# Patient Record
Sex: Male | Born: 1935 | Race: White | Hispanic: No | Marital: Married | Smoking: Never smoker
Health system: Southern US, Community
[De-identification: ages and names within clinical notes are randomized; demographics above are authoritative.]

## PROBLEM LIST (undated history)

## (undated) DIAGNOSIS — I2510 Atherosclerotic heart disease of native coronary artery without angina pectoris: Secondary | ICD-10-CM

---

## 2011-02-15 ENCOUNTER — Ambulatory Visit: Payer: Self-pay | Admitting: Rehabilitation

## 2011-02-26 ENCOUNTER — Ambulatory Visit: Payer: Medicare Other | Attending: Family Medicine | Admitting: Physical Therapy

## 2011-02-26 DIAGNOSIS — R269 Unspecified abnormalities of gait and mobility: Secondary | ICD-10-CM | POA: Insufficient documentation

## 2011-02-26 DIAGNOSIS — IMO0001 Reserved for inherently not codable concepts without codable children: Secondary | ICD-10-CM | POA: Insufficient documentation

## 2014-02-23 DIAGNOSIS — H9190 Unspecified hearing loss, unspecified ear: Secondary | ICD-10-CM

## 2014-02-23 DIAGNOSIS — K5090 Crohn's disease, unspecified, without complications: Secondary | ICD-10-CM | POA: Insufficient documentation

## 2014-04-05 DIAGNOSIS — N400 Enlarged prostate without lower urinary tract symptoms: Secondary | ICD-10-CM | POA: Insufficient documentation

## 2014-07-06 DIAGNOSIS — N1831 Chronic kidney disease, stage 3a: Secondary | ICD-10-CM | POA: Diagnosis present

## 2015-07-08 DIAGNOSIS — I10 Essential (primary) hypertension: Secondary | ICD-10-CM | POA: Diagnosis present

## 2020-02-15 DIAGNOSIS — I5040 Unspecified combined systolic (congestive) and diastolic (congestive) heart failure: Secondary | ICD-10-CM | POA: Diagnosis present

## 2021-02-28 ENCOUNTER — Encounter (HOSPITAL_BASED_OUTPATIENT_CLINIC_OR_DEPARTMENT_OTHER): Payer: Self-pay | Admitting: *Deleted

## 2021-02-28 ENCOUNTER — Emergency Department (HOSPITAL_BASED_OUTPATIENT_CLINIC_OR_DEPARTMENT_OTHER)
Admission: EM | Admit: 2021-02-28 | Discharge: 2021-02-28 | Disposition: A | Discharge status: Home / Self Care | Payer: Medicare HMO | Attending: Emergency Medicine | Admitting: Emergency Medicine

## 2021-02-28 ENCOUNTER — Emergency Department (HOSPITAL_BASED_OUTPATIENT_CLINIC_OR_DEPARTMENT_OTHER): Payer: Medicare HMO

## 2021-02-28 ENCOUNTER — Other Ambulatory Visit: Payer: Self-pay

## 2021-02-28 DIAGNOSIS — I129 Hypertensive chronic kidney disease with stage 1 through stage 4 chronic kidney disease, or unspecified chronic kidney disease: Secondary | ICD-10-CM | POA: Insufficient documentation

## 2021-02-28 DIAGNOSIS — I4819 Other persistent atrial fibrillation: Secondary | ICD-10-CM | POA: Insufficient documentation

## 2021-02-28 DIAGNOSIS — S51012A Laceration without foreign body of left elbow, initial encounter: Secondary | ICD-10-CM

## 2021-02-28 DIAGNOSIS — N183 Chronic kidney disease, stage 3 unspecified: Secondary | ICD-10-CM | POA: Diagnosis not present

## 2021-02-28 DIAGNOSIS — Z79899 Other long term (current) drug therapy: Secondary | ICD-10-CM | POA: Diagnosis not present

## 2021-02-28 DIAGNOSIS — Z96641 Presence of right artificial hip joint: Secondary | ICD-10-CM | POA: Insufficient documentation

## 2021-02-28 DIAGNOSIS — Y9289 Other specified places as the place of occurrence of the external cause: Secondary | ICD-10-CM | POA: Insufficient documentation

## 2021-02-28 DIAGNOSIS — I2510 Atherosclerotic heart disease of native coronary artery without angina pectoris: Secondary | ICD-10-CM | POA: Insufficient documentation

## 2021-02-28 DIAGNOSIS — S5002XA Contusion of left elbow, initial encounter: Secondary | ICD-10-CM

## 2021-02-28 DIAGNOSIS — W010XXA Fall on same level from slipping, tripping and stumbling without subsequent striking against object, initial encounter: Secondary | ICD-10-CM | POA: Diagnosis not present

## 2021-02-28 DIAGNOSIS — Z7901 Long term (current) use of anticoagulants: Secondary | ICD-10-CM | POA: Insufficient documentation

## 2021-02-28 DIAGNOSIS — S59902A Unspecified injury of left elbow, initial encounter: Secondary | ICD-10-CM | POA: Diagnosis present

## 2021-02-28 DIAGNOSIS — W19XXXA Unspecified fall, initial encounter: Secondary | ICD-10-CM

## 2021-02-28 HISTORY — DX: Atherosclerotic heart disease of native coronary artery without angina pectoris: I25.10

## 2021-02-28 NOTE — ED Notes (Signed)
Pt ambulatory to waiting room. Pt verbalized understanding of discharge instructions.   

## 2021-02-28 NOTE — ED Provider Notes (Signed)
[facility] EMERGENCY DEPARTMENT Provider Note   CSN: [id number] Arrival date & time: 02/28/21  1406     History Chief Complaint  Patient presents with   Fall   Elbow Injury    Hector Vazquez is a 87 y.o. male.  Pt presents to the ED today with left elbow pain.  Pt lost his balance and fell and hit his left elbow.  He is on coumadin for a hx of afib.  Pt said he was bringing food out to the deer.  He lost his wife of 70 years and said she normally would have fixed him up, but she is no longer there.  He has her picture in his pocket and said he really misses her.  He denies any other injury.  He did not hit his head.      Past Medical History:  Diagnosis Date   Coronary artery disease     There are no problems to display for this patient.   History reviewed. No pertinent surgical history.    Medical history Past Medical History:  Diagnosis Date   BPH (benign prostatic hyperplasia) 04/05/2014   CKD (chronic kidney disease), stage III (HCC)   Crohn's disease (HCC) 02/23/2014   Cutaneous ulcer, limited to breakdown of skin (HCC) 07/25/2017  right plantar surface of foot   Elevated MCV 03/31/2018   Essential hypertension   GERD (gastroesophageal reflux disease) 07/08/2015   Hearing loss   HOH (hard of hearing) 08/07/2018   Hyperlipidemia   Organic impotence   Osteoarthritis   Peripheral edema  wears compression stockings   Peripheral neuropathy   Persistent atrial fibrillation (HCC) 2015  and anticoagulated on Coumadin managed by [facility]; Dr. Sampson Goon   Renal cyst   Rosacea   Stage 3 chronic kidney disease (HCC) 07/06/2014   Vertigo   Surgical History Past Surgical History:  Procedure Laterality Date   CATARACT EXTRACTION W/ INTRAOCULAR LENS IMPLANT Right 08/19/2018  Procedure: PHACOEMULSIFICATION PC / IOL TOPICAL; Surgeon: Holli Humbles, MD; Location: [facility] MAIN OR; Service: Ophthalmology; Laterality: Right;   CATARACT EXTRACTION W/  INTRAOCULAR LENS IMPLANT Left 09/04/2018  Procedure: PHACOEMULSIFICATION PC / IOL TOPICAL; Surgeon: Holli Humbles, MD; Location: [facility] MAIN OR; Service: Ophthalmology; Laterality: Left;   EP CARDIOVERSION 2015   HERNIA REPAIR Bilateral remote   HIP ARTHROPLASTY Right 2004  due to OA    No family history on file.  Social History   Tobacco Use   Smoking status: Never   Smokeless tobacco: Never  Vaping Use   Vaping Use: Never used  Substance Use Topics   Alcohol use: Never   Drug use: Never    Home Medications Prior to Admission medications   Medication Sig Start Date End Date Taking? Authorizing Provider  b complex vitamins capsule Take 1 capsule by mouth daily. 09/03/18  Yes [provider]  Cholecalciferol 25 MCG (1000 UT) capsule Take by mouth. 11/17/13  Yes [provider]  diltiazem (TIAZAC) 240 MG 24 hr capsule Take 1 capsule by mouth daily. 12/12/20 12/12/21 Yes [provider]  lovastatin (MEVACOR) 20 MG tablet Take by mouth. 12/12/20  Yes [provider]  metoprolol succinate (TOPROL-XL) 25 MG 24 hr tablet Take 1 tablet by mouth daily. 02/17/21  Yes [provider]  warfarin (COUMADIN) 2.5 MG tablet Take 1 full tablet daily or as directed 02/03/21  Yes [provider]    Allergies    Patient has no known allergies.  Review of Systems  Review of Systems  Musculoskeletal:        Left elbow pain  All other systems reviewed and are negative.  Physical Exam Updated Vital Signs BP 131/80 (BP Location: Right Arm)   Pulse 98   Temp 99.5 F (37.5 C) (Oral)   Resp 20   Ht 6\' 3"  (1.905 m)   Wt 90.7 kg   SpO2 93%   BMI 25.00 kg/m   Physical Exam Vitals and nursing note reviewed.  Constitutional:      Appearance: Normal appearance.  HENT:     Head: Normocephalic and atraumatic.     Right Ear: External ear normal.     Left Ear: External ear normal.     Nose: Nose normal.     Mouth/Throat:     Mouth: Mucous  membranes are moist.     Pharynx: Oropharynx is clear.  Eyes:     Extraocular Movements: Extraocular movements intact.     Conjunctiva/sclera: Conjunctivae normal.     Pupils: Pupils are equal, round, and reactive to light.  Cardiovascular:     Rate and Rhythm: Normal rate. Rhythm irregular.     Pulses: Normal pulses.     Heart sounds: Normal heart sounds.  Pulmonary:     Effort: Pulmonary effort is normal.     Breath sounds: Normal breath sounds.  Abdominal:     General: Abdomen is flat. Bowel sounds are normal.     Palpations: Abdomen is soft.  Musculoskeletal:     Cervical back: Normal range of motion and neck supple.     Comments: Good rom to left elbow.  Skin tear to left elbow.  Skin:    Capillary Refill: Capillary refill takes less than 2 seconds.  Neurological:     General: No focal deficit present.     Mental Status: He is alert and oriented to person, place, and time.  Psychiatric:        Mood and Affect: Mood normal.    ED Results / Procedures / Treatments   Labs (all labs ordered are listed, but only abnormal results are displayed) Labs Reviewed - No data to display  EKG None  Radiology DG Elbow Complete Left  Result Date: 02/28/2021 CLINICAL DATA:  87 year old male with fall EXAM: LEFT ELBOW - COMPLETE 3+ VIEW COMPARISON:  None. FINDINGS: There is no evidence of fracture, dislocation, or joint effusion. Triceps tendon enthesophyte. No focal bone abnormality. Soft tissues are unremarkable. IMPRESSION: No acute fracture or dislocation. Electronically Signed   By: 92 MD   On: 02/28/2021 15:14    Procedures .10/09/2022Laceration Repair  Date/Time: 02/28/2021 4:42 PM Performed by: 04/30/2021, MD Authorized by: Jacalyn Lefevre, MD   Consent:    Consent obtained:  Verbal   Consent given by:  Patient Universal protocol:    Patient identity confirmed:  Verbally with patient Anesthesia:    Anesthesia method:  None Laceration details:    Location:   Shoulder/arm   Shoulder/arm location:  L elbow   Length (cm):  1.5 Exploration:    Contaminated: no   Treatment:    Area cleansed with:  Saline Skin repair:    Repair method:  Steri-Strips   Number of Steri-Strips:  3 Repair type:    Repair type:  Simple Post-procedure details:    Dressing:  Sterile dressing   Procedure completion:  Tolerated well, no immediate complications   Medications Ordered in ED Medications - No data to display  ED Course  I have reviewed the  triage vital signs and the nursing notes.  Pertinent labs & imaging results that were available during my care of the patient were reviewed by me and considered in my medical decision making (see chart for details).    MDM Rules/Calculators/A&P                           Xray neg.  Pt is stable for d/c.  Return if worse.  F/u with pcp. Final Clinical Impression(s) / ED Diagnoses Final diagnoses:  Fall, initial encounter  Skin tear of left elbow without complication, initial encounter  Contusion of left elbow, initial encounter    Rx / DC Orders ED Discharge Orders     None        Jacalyn Lefevre, MD 02/28/21 1643

## 2021-02-28 NOTE — ED Triage Notes (Signed)
He lost his balance and fell this am. Abrasion and pain to his left elbow.

## 2021-12-18 ENCOUNTER — Other Ambulatory Visit: Payer: Self-pay

## 2021-12-18 ENCOUNTER — Encounter (HOSPITAL_BASED_OUTPATIENT_CLINIC_OR_DEPARTMENT_OTHER): Payer: Self-pay | Admitting: Emergency Medicine

## 2021-12-18 ENCOUNTER — Emergency Department (HOSPITAL_BASED_OUTPATIENT_CLINIC_OR_DEPARTMENT_OTHER)
Admission: EM | Admit: 2021-12-18 | Discharge: 2021-12-18 | Disposition: A | Discharge status: Home / Self Care | Payer: Medicare HMO | Attending: Emergency Medicine | Admitting: Emergency Medicine

## 2021-12-18 ENCOUNTER — Emergency Department (HOSPITAL_BASED_OUTPATIENT_CLINIC_OR_DEPARTMENT_OTHER): Payer: Medicare HMO

## 2021-12-18 DIAGNOSIS — M25561 Pain in right knee: Secondary | ICD-10-CM | POA: Insufficient documentation

## 2021-12-18 DIAGNOSIS — S8001XA Contusion of right knee, initial encounter: Secondary | ICD-10-CM | POA: Insufficient documentation

## 2021-12-18 DIAGNOSIS — W1830XA Fall on same level, unspecified, initial encounter: Secondary | ICD-10-CM | POA: Insufficient documentation

## 2021-12-18 DIAGNOSIS — Z7901 Long term (current) use of anticoagulants: Secondary | ICD-10-CM | POA: Diagnosis not present

## 2021-12-18 DIAGNOSIS — S8991XA Unspecified injury of right lower leg, initial encounter: Secondary | ICD-10-CM | POA: Diagnosis present

## 2021-12-18 MED ORDER — DICLOFENAC SODIUM 1 % EX GEL: 4.0000 g | Freq: Four times a day (QID) | CUTANEOUS | 0 refills | Status: DC

## 2021-12-18 NOTE — ED Triage Notes (Signed)
Pt reports falling on concrete on the 24th, hitting his right knee. He has been having increased pain and bruising to the area. Denies numbness in foot, ambulatory with cane, bilateral edema; large area of bruising around the knee.

## 2021-12-18 NOTE — ED Provider Notes (Signed)
[place] EMERGENCY DEPARTMENT Provider Note   CSN: [id number] Arrival date & time: 12/18/21  1049     History  Chief Complaint  Patient presents with   Knee Pain    Hector Vazquez is a 88 y.o. male.  88 yo M with a chief complaints of a fall.  This was not syncopal by history and occurred about 5 days ago.  He fell onto the concrete onto a flexed knee.  Has had some pain and swelling since then.  He has been able to ambulate but with some discomfort.  He developed a bit of bruising there and was concerned and so came to the emergency department for evaluation.       Home Medications Prior to Admission medications   Medication Sig Start Date End Date Taking? Authorizing Provider  b complex vitamins capsule Take 1 capsule by mouth daily. 09/03/18   [provider]  Cholecalciferol 25 MCG (1000 UT) capsule Take by mouth. 11/17/13   [provider]  diclofenac Sodium (VOLTAREN) 1 % GEL Apply 4 g topically 4 (four) times daily. 12/18/21   Deno Etienne, DO  diltiazem (TIAZAC) 240 MG 24 hr capsule Take 1 capsule by mouth daily. 12/12/20 12/12/21  [provider]  lovastatin (MEVACOR) 20 MG tablet Take by mouth. 12/12/20   [provider]  metoprolol succinate (TOPROL-XL) 25 MG 24 hr tablet Take 1 tablet by mouth daily. 02/17/21   [provider]  warfarin (COUMADIN) 2.5 MG tablet Take 1 full tablet daily or as directed 02/03/21   [provider]      Allergies    Patient has no known allergies.    Review of Systems   Review of Systems  Physical Exam Updated Vital Signs BP 124/70 (BP Location: Right Arm)   Pulse (!) 57   Temp 97.9 F (36.6 C) (Oral)   Resp 20   Ht 6\' 3"  (1.905 m)   Wt 97.5 kg   SpO2 97%   BMI 26.87 kg/m  Physical Exam Vitals and nursing note reviewed.  Constitutional:      Appearance: He is well-developed.  HENT:     Head: Normocephalic and atraumatic.  Eyes:     Pupils: Pupils are equal, round,  and reactive to light.  Neck:     Vascular: No JVD.  Cardiovascular:     Rate and Rhythm: Normal rate and regular rhythm.     Heart sounds: No murmur heard.   No friction rub. No gallop.  Pulmonary:     Effort: No respiratory distress.     Breath sounds: No wheezing.  Abdominal:     General: There is no distension.     Tenderness: There is no abdominal tenderness. There is no guarding or rebound.  Musculoskeletal:        General: Swelling and tenderness present. Normal range of motion.     Cervical back: Normal range of motion and neck supple.     Comments: Pain and swelling to the right lower extremity.  Chronic appearing edema to bilateral lower extremities slightly worse on the right than the left.  Pulse motor and sensation intact distally.  Large hematoma just distal to the knee along the medial aspect of the leg on the right.  No obvious bony tenderness.  Able to range the knee without significant pain.  Skin:    Coloration: Skin is not pale.     Findings: No rash.  Neurological:     Mental Status: He  is alert and oriented to person, place, and time.  Psychiatric:        Behavior: Behavior normal.    ED Results / Procedures / Treatments   Labs (all labs ordered are listed, but only abnormal results are displayed) Labs Reviewed - No data to display  EKG None  Radiology DG Knee Complete 4 Views Right  Result Date: 12/18/2021 CLINICAL DATA:  Knee pain after fall. EXAM: RIGHT KNEE - COMPLETE 4+ VIEW COMPARISON:  None Available. FINDINGS: Enthesopathic changes associated with the patella. 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 No acute fracture identified. No joint effusion. No significant degenerative changes. No other acute abnormalities. IMPRESSION: No acute abnormalities are identified. Electronically Signed   By: Dorise Bullion III M.D.   On: 12/18/2021 11:49    Procedures Procedures    Medications Ordered in ED Medications - No data to display  ED Course/  Medical Decision Making/ A&P                           Medical Decision Making Amount and/or Complexity of Data Reviewed Radiology: ordered.   88 yo M with a chief complaint of right knee pain.  This started about 5 days ago after a nonsyncopal fall.  Clinically the patient is suffering from a fairly large hematoma.  No signs of compartment syndrome.  We will obtain a plain film of the knee to assess for fracture.  Plain film independently interpreted by me without fracture.  Knee sleeves for support.  PCP follow-up.  11:55 AM:  I have discussed the diagnosis/risks/treatment options with the patient.  Evaluation and diagnostic testing in the emergency department does not suggest an emergent condition requiring admission or immediate intervention beyond what has been performed at this time.  They will follow up with  PCP. We also discussed returning to the ED immediately if new or worsening sx occur. We discussed the sx which are most concerning (e.g., sudden worsening pain, fever, inability to tolerate by mouth) that necessitate immediate return. Medications administered to the patient during their visit and any new prescriptions provided to the patient are listed below.  Medications given during this visit Medications - No data to display   The patient appears reasonably screen and/or stabilized for discharge and I doubt any other medical condition or other [facility] requiring further screening, evaluation, or treatment in the ED at this time prior to discharge.          Final Clinical Impression(s) / ED Diagnoses Final diagnoses:  Acute pain of right knee  Hematoma of right knee region    Rx / DC Orders ED Discharge Orders          Ordered    diclofenac Sodium (VOLTAREN) 1 % GEL  4 times daily,   Status:  Discontinued        12/18/21 1153    diclofenac Sodium (VOLTAREN) 1 % GEL  4 times daily        12/18/21 Bieber, Saliha Salts, DO 12/18/21 1155

## 2021-12-18 NOTE — Discharge Instructions (Signed)
Use the gel as prescribed.  Take tylenol 1000mg(2 extra strength) four times a day.      

## 2021-12-18 NOTE — ED Notes (Signed)
Pt to XRAY

## 2021-12-19 ENCOUNTER — Encounter (HOSPITAL_BASED_OUTPATIENT_CLINIC_OR_DEPARTMENT_OTHER): Payer: Self-pay

## 2021-12-19 ENCOUNTER — Emergency Department (HOSPITAL_BASED_OUTPATIENT_CLINIC_OR_DEPARTMENT_OTHER)
Admission: EM | Admit: 2021-12-19 | Discharge: 2021-12-19 | Disposition: A | Discharge status: Home / Self Care | Payer: Medicare HMO | Attending: Emergency Medicine | Admitting: Emergency Medicine

## 2021-12-19 ENCOUNTER — Other Ambulatory Visit: Payer: Self-pay

## 2021-12-19 DIAGNOSIS — R600 Localized edema: Secondary | ICD-10-CM | POA: Insufficient documentation

## 2021-12-19 DIAGNOSIS — M25561 Pain in right knee: Secondary | ICD-10-CM | POA: Diagnosis present

## 2021-12-19 DIAGNOSIS — I2510 Atherosclerotic heart disease of native coronary artery without angina pectoris: Secondary | ICD-10-CM | POA: Insufficient documentation

## 2021-12-19 DIAGNOSIS — Z79899 Other long term (current) drug therapy: Secondary | ICD-10-CM | POA: Insufficient documentation

## 2021-12-19 DIAGNOSIS — R2243 Localized swelling, mass and lump, lower limb, bilateral: Secondary | ICD-10-CM | POA: Diagnosis not present

## 2021-12-19 DIAGNOSIS — Z7901 Long term (current) use of anticoagulants: Secondary | ICD-10-CM | POA: Diagnosis not present

## 2021-12-19 DIAGNOSIS — I4891 Unspecified atrial fibrillation: Secondary | ICD-10-CM | POA: Diagnosis not present

## 2021-12-19 DIAGNOSIS — Z87898 Personal history of other specified conditions: Secondary | ICD-10-CM

## 2021-12-19 NOTE — ED Triage Notes (Signed)
Arrived to triage via W/C, brought in by family. Seen yesterday for right knee pain. Family reports knee brace is to tight and unable to remove due to swelling

## 2021-12-19 NOTE — Discharge Instructions (Signed)
Your knee brace was removed today.  Believe the worsening swelling was secondary to the knee brace.  We do not need to put another knee brace on.  I do recommend that she elevate your legs when you are lying down, and wear compression stockings.  You state that you normally have some degree of swelling in both of your lower legs however the swelling worsened after you have the knee brace put on yesterday.  We did discuss doing an ultrasound of your leg however given that you are already taken a blood thinner this would not change management even if there was a blood clot.  You are in agreement to not have this done today.  If you do develop chest pain, shortness of breath we would want to see in the emergency room at that time to evaluate for a blood clot in the lungs.  Keep your appointment with your primary care doctor tomorrow morning.  If you have any worsening symptoms return to the emergency room.

## 2021-12-19 NOTE — ED Provider Notes (Signed)
[facility] EMERGENCY DEPARTMENT Provider Note   CSN: [id number] Arrival date & time: 12/19/21  1744     History  Chief Complaint  Patient presents with   Knee Pain    Hector Vazquez is a 88 y.o. male.  88 year old male with past medical history of peripheral edema was recently seen yesterday for evaluation of right knee pain.  Patient had a fall that occurred on 5/24.  He was struck by a shopping cart which caused him to lose his balance and fall onto pavement.  He does take Coumadin for A-fib.  Recent INR today was 3.0.  Has a large hematoma in the right knee.  Bruising noted.  Reports today patient's swelling in the right lower extremity worsened and he was unable to get the knee brace off which was placed yesterday.  Denies other complaints.  Patient is still able to ambulate using his cane which he has been using for over a year.  He presents today with his neighbor.  Patient does live alone.  He has an appointment with his PCP tomorrow morning at 9 AM.  Denies chest pain, shortness of breath.  Denies any worsening in range of motion in his right lower extremity or any other new complaints other than worsening swelling since the knee brace was put on.  The history is provided by the patient. No language interpreter was used.      Home Medications Prior to Admission medications   Medication Sig Start Date End Date Taking? Authorizing Provider  b complex vitamins capsule Take 1 capsule by mouth daily. 09/03/18   [provider]  Cholecalciferol 25 MCG (1000 UT) capsule Take by mouth. 11/17/13   [provider]  diclofenac Sodium (VOLTAREN) 1 % GEL Apply 4 g topically 4 (four) times daily. 12/18/21   Melene Plan, DO  diltiazem (TIAZAC) 240 MG 24 hr capsule Take 1 capsule by mouth daily. 12/12/20 12/12/21  [provider]  lovastatin (MEVACOR) 20 MG tablet Take by mouth. 12/12/20   [provider]  metoprolol succinate (TOPROL-XL) 25 MG 24 hr tablet  Take 1 tablet by mouth daily. 02/17/21   [provider]  warfarin (COUMADIN) 2.5 MG tablet Take 1 full tablet daily or as directed 02/03/21   [provider]      Allergies    Patient has no known allergies.    Review of Systems   Review of Systems  Constitutional:  Negative for chills and fever.  Respiratory:  Negative for shortness of breath.   Cardiovascular:  Negative for chest pain.  Musculoskeletal:  Positive for arthralgias and joint swelling. Negative for gait problem.  All other systems reviewed and are negative.  Physical Exam Updated Vital Signs BP 122/79 (BP Location: Left Arm)   Pulse 64   Temp 97.7 F (36.5 C) (Oral)   Resp 20   Ht 6\' 3"  (1.905 m)   Wt 97 kg   SpO2 97%   BMI 26.73 kg/m  Physical Exam Vitals and nursing note reviewed.  Constitutional:      General: He is not in acute distress.    Appearance: Normal appearance. He is not ill-appearing.  HENT:     Head: Normocephalic and atraumatic.     Nose: Nose normal.  Eyes:     Conjunctiva/sclera: Conjunctivae normal.  Cardiovascular:     Rate and Rhythm: Normal rate.  Pulmonary:     Effort: Pulmonary effort is normal. No respiratory distress.  Musculoskeletal:  General: No deformity.     Comments: Bilateral lower extremity edema noted.  Worse on the right lower extremity.  Bruising and hematoma noted to right knee.  2+ DP pulse present bilaterally.  He does have good range of motion.  Skin:    Findings: No rash.  Neurological:     Mental Status: He is alert.    ED Results / Procedures / Treatments   Labs (all labs ordered are listed, but only abnormal results are displayed) Labs Reviewed - No data to display  EKG None  Radiology DG Knee Complete 4 Views Right  Result Date: 12/18/2021 CLINICAL DATA:  Knee pain after fall. EXAM: RIGHT KNEE - COMPLETE 4+ VIEW COMPARISON:  None Available. FINDINGS: Enthesopathic changes associated with the patella. 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 No acute fracture identified. No joint effusion. No significant degenerative changes. No other acute abnormalities. IMPRESSION: No acute abnormalities are identified. Electronically Signed   By: Dorise Bullion III M.D.   On: 12/18/2021 11:49    Procedures Procedures    Medications Ordered in ED Medications - No data to display  ED Course/ Medical Decision Making/ A&P                           Medical Decision Making  Medical Decision Making / ED Course   This patient presents to the ED for concern of right lower extremity swelling, this involves an extensive number of treatment options, and is a complaint that carries with it a high risk of complications and morbidity.  The differential diagnosis includes venous insufficiency, secondary to knee brace, DVT  MDM: 88 year old male presents today for evaluation of worsening peripheral edema, and a knee brace that he is unable to remove due to edema.  He does have history of peripheral edema.  Although he is not sure of its etiology.  I do not see a diagnosis of CHF or other identifiable etiology of his peripheral edema.  He states this has been present for years.  Denies any worsening in pain or other new symptoms other than the worsening swelling since the knee brace was put on yesterday.  He was evaluated for right knee pain and swelling following a fall that occurred on 5/24.  X-rays without acute bony injury.  Does have hematoma of the right knee.  Symptomatic treatment discussed regarding this.  Given patient has history of A-fib and is on chronic anticoagulation with current INR as of today of 3.0.  He is without signs or symptoms concerning for PE.  We discussed doing a ultrasound of his right lower extremity however given he is already on anticoagulation and would not change management he defers this today.  I believe this is reasonable.  Symptomatic treatment discussed.  He does have follow-up with his PCP tomorrow  morning.  Patient and his neighbor who is at bedside are both in agreement.  Lab Tests: -I ordered, reviewed, and interpreted labs.   The pertinent results include:   Labs Reviewed - No data to display    EKG  EKG Interpretation  Date/Time:    Ventricular Rate:    PR Interval:    QRS Duration:   QT Interval:    QTC Calculation:   R Axis:     Text Interpretation:          Medicines ordered and prescription drug management: No orders of the defined types were placed in this encounter.   -  I have reviewed the patients home medicines and have made adjustments as needed  Co morbidities that complicate the patient evaluation  Past Medical History:  Diagnosis Date   Coronary artery disease       Dispostion: Patient is appropriate for discharge.  Discharged in stable condition.  Return precautions discussed.  Patient voices understanding and is in agreement with plan. Case discussed with my attending who is in agreement with plan.   Final Clinical Impression(s) / ED Diagnoses Final diagnoses:  Acute pain of right knee  History of peripheral edema    Rx / DC Orders ED Discharge Orders     None         Evlyn Courier, PA-C 12/19/21 2015    Tegeler, Gwenyth Allegra, MD 12/20/21 [phone]

## 2022-06-20 ENCOUNTER — Emergency Department (HOSPITAL_COMMUNITY): Payer: Medicare HMO

## 2022-06-20 ENCOUNTER — Inpatient Hospital Stay (HOSPITAL_COMMUNITY)
Admission: EM | Admit: 2022-06-20 | Discharge: 2022-07-02 | DRG: 291 | Disposition: A | Discharge status: Skilled Nursing Facility | Payer: Medicare HMO | Attending: Family Medicine | Admitting: Family Medicine

## 2022-06-20 ENCOUNTER — Other Ambulatory Visit: Payer: Self-pay

## 2022-06-20 DIAGNOSIS — L97519 Non-pressure chronic ulcer of other part of right foot with unspecified severity: Secondary | ICD-10-CM | POA: Diagnosis present

## 2022-06-20 DIAGNOSIS — I509 Heart failure, unspecified: Secondary | ICD-10-CM

## 2022-06-20 DIAGNOSIS — I5023 Acute on chronic systolic (congestive) heart failure: Secondary | ICD-10-CM | POA: Diagnosis present

## 2022-06-20 DIAGNOSIS — W1830XA Fall on same level, unspecified, initial encounter: Secondary | ICD-10-CM | POA: Diagnosis present

## 2022-06-20 DIAGNOSIS — J9811 Atelectasis: Secondary | ICD-10-CM | POA: Diagnosis present

## 2022-06-20 DIAGNOSIS — I5082 Biventricular heart failure: Secondary | ICD-10-CM | POA: Diagnosis present

## 2022-06-20 DIAGNOSIS — M47819 Spondylosis without myelopathy or radiculopathy, site unspecified: Secondary | ICD-10-CM | POA: Diagnosis present

## 2022-06-20 DIAGNOSIS — R262 Difficulty in walking, not elsewhere classified: Secondary | ICD-10-CM | POA: Diagnosis not present

## 2022-06-20 DIAGNOSIS — I951 Orthostatic hypotension: Secondary | ICD-10-CM | POA: Diagnosis present

## 2022-06-20 DIAGNOSIS — I50813 Acute on chronic right heart failure: Secondary | ICD-10-CM | POA: Diagnosis not present

## 2022-06-20 DIAGNOSIS — M549 Dorsalgia, unspecified: Secondary | ICD-10-CM | POA: Diagnosis present

## 2022-06-20 DIAGNOSIS — I2510 Atherosclerotic heart disease of native coronary artery without angina pectoris: Secondary | ICD-10-CM | POA: Diagnosis present

## 2022-06-20 DIAGNOSIS — R296 Repeated falls: Secondary | ICD-10-CM | POA: Diagnosis present

## 2022-06-20 DIAGNOSIS — I480 Paroxysmal atrial fibrillation: Secondary | ICD-10-CM | POA: Diagnosis present

## 2022-06-20 DIAGNOSIS — I872 Venous insufficiency (chronic) (peripheral): Secondary | ICD-10-CM | POA: Diagnosis present

## 2022-06-20 DIAGNOSIS — N1832 Chronic kidney disease, stage 3b: Secondary | ICD-10-CM | POA: Diagnosis present

## 2022-06-20 DIAGNOSIS — E785 Hyperlipidemia, unspecified: Secondary | ICD-10-CM | POA: Diagnosis present

## 2022-06-20 DIAGNOSIS — G629 Polyneuropathy, unspecified: Secondary | ICD-10-CM | POA: Diagnosis present

## 2022-06-20 DIAGNOSIS — I5021 Acute systolic (congestive) heart failure: Secondary | ICD-10-CM | POA: Diagnosis not present

## 2022-06-20 DIAGNOSIS — E876 Hypokalemia: Secondary | ICD-10-CM | POA: Diagnosis present

## 2022-06-20 DIAGNOSIS — I2489 Other forms of acute ischemic heart disease: Secondary | ICD-10-CM | POA: Diagnosis present

## 2022-06-20 DIAGNOSIS — Z66 Do not resuscitate: Secondary | ICD-10-CM | POA: Diagnosis present

## 2022-06-20 DIAGNOSIS — J9601 Acute respiratory failure with hypoxia: Secondary | ICD-10-CM | POA: Diagnosis not present

## 2022-06-20 DIAGNOSIS — Z79899 Other long term (current) drug therapy: Secondary | ICD-10-CM

## 2022-06-20 DIAGNOSIS — I130 Hypertensive heart and chronic kidney disease with heart failure and stage 1 through stage 4 chronic kidney disease, or unspecified chronic kidney disease: Principal | ICD-10-CM | POA: Diagnosis present

## 2022-06-20 DIAGNOSIS — Z7901 Long term (current) use of anticoagulants: Secondary | ICD-10-CM | POA: Diagnosis not present

## 2022-06-20 DIAGNOSIS — G4700 Insomnia, unspecified: Secondary | ICD-10-CM | POA: Diagnosis present

## 2022-06-20 DIAGNOSIS — T1490XA Injury, unspecified, initial encounter: Principal | ICD-10-CM

## 2022-06-20 LAB — URINALYSIS, ROUTINE W REFLEX MICROSCOPIC
Bacteria, UA: NONE SEEN
Bilirubin Urine: NEGATIVE
Glucose, UA: NEGATIVE mg/dL
Ketones, ur: 5 mg/dL — AB
Leukocytes,Ua: NEGATIVE
Nitrite: NEGATIVE
Protein, ur: NEGATIVE mg/dL
Specific Gravity, Urine: 1.012 (ref 1.005–1.030)
pH: 6 (ref 5.0–8.0)

## 2022-06-20 LAB — CBC WITH DIFFERENTIAL/PLATELET
Abs Immature Granulocytes: 0.07 10*3/uL (ref 0.00–0.07)
Basophils Absolute: 0.1 10*3/uL (ref 0.0–0.1)
Basophils Relative: 1 %
Eosinophils Absolute: 0 10*3/uL (ref 0.0–0.5)
Eosinophils Relative: 0 %
HCT: 37.2 % — ABNORMAL LOW (ref 39.0–52.0)
Hemoglobin: 12.5 g/dL — ABNORMAL LOW (ref 13.0–17.0)
Immature Granulocytes: 1 %
Lymphocytes Relative: 10 %
Lymphs Abs: 1 10*3/uL (ref 0.7–4.0)
MCH: 34.8 pg — ABNORMAL HIGH (ref 26.0–34.0)
MCHC: 33.6 g/dL (ref 30.0–36.0)
MCV: 103.6 fL — ABNORMAL HIGH (ref 80.0–100.0)
Monocytes Absolute: 1 10*3/uL (ref 0.1–1.0)
Monocytes Relative: 10 %
Neutro Abs: 7.8 10*3/uL — ABNORMAL HIGH (ref 1.7–7.7)
Neutrophils Relative %: 78 %
Platelets: 266 10*3/uL (ref 150–400)
RBC: 3.59 MIL/uL — ABNORMAL LOW (ref 4.22–5.81)
RDW: 14.2 % (ref 11.5–15.5)
WBC: 10 10*3/uL (ref 4.0–10.5)
nRBC: 0 % (ref 0.0–0.2)

## 2022-06-20 LAB — COMPREHENSIVE METABOLIC PANEL
ALT: 17 U/L (ref 0–44)
AST: 27 U/L (ref 15–41)
Albumin: 3.5 g/dL (ref 3.5–5.0)
Alkaline Phosphatase: 30 U/L — ABNORMAL LOW (ref 38–126)
Anion gap: 11 (ref 5–15)
BUN: 30 mg/dL — ABNORMAL HIGH (ref 8–23)
CO2: 22 mmol/L (ref 22–32)
Calcium: 8.7 mg/dL — ABNORMAL LOW (ref 8.9–10.3)
Chloride: 109 mmol/L (ref 98–111)
Creatinine, Ser: 1.66 mg/dL — ABNORMAL HIGH (ref 0.61–1.24)
GFR, Estimated: 39 mL/min — ABNORMAL LOW (ref >60)
Glucose, Bld: 95 mg/dL (ref 70–99)
Potassium: 4.1 mmol/L (ref 3.5–5.1)
Sodium: 142 mmol/L (ref 135–145)
Total Bilirubin: 0.9 mg/dL (ref 0.3–1.2)
Total Protein: 6 g/dL — ABNORMAL LOW (ref 6.5–8.1)

## 2022-06-20 LAB — PROTIME-INR
INR: 2.8 — ABNORMAL HIGH (ref 0.8–1.2)
Prothrombin Time: 29.5 seconds — ABNORMAL HIGH (ref 11.4–15.2)

## 2022-06-20 LAB — TROPONIN I (HIGH SENSITIVITY)
Troponin I (High Sensitivity): 27 ng/L — ABNORMAL HIGH (ref <18)
Troponin I (High Sensitivity): 28 ng/L — ABNORMAL HIGH (ref <18)

## 2022-06-20 LAB — BRAIN NATRIURETIC PEPTIDE: B Natriuretic Peptide: 741.2 pg/mL — ABNORMAL HIGH (ref 0.0–100.0)

## 2022-06-20 MED ORDER — PRAVASTATIN SODIUM 10 MG PO TABS
20.0000 mg | ORAL_TABLET | Freq: Every day | ORAL | Status: DC
  Administered 2022-06-20 – 2022-07-01 (×12): 20 mg via ORAL
  Filled 2022-06-20 (×13): qty 2

## 2022-06-20 MED ORDER — METOPROLOL TARTRATE 25 MG PO TABS
12.5000 mg | ORAL_TABLET | Freq: Once | ORAL | Status: AC
  Administered 2022-06-20: 12.5 mg via ORAL
  Filled 2022-06-20: qty 1

## 2022-06-20 MED ORDER — SODIUM CHLORIDE 0.9% FLUSH
3.0000 mL | INTRAVENOUS | Status: DC | PRN
  Administered 2022-06-20 – 2022-07-02 (×2): 3 mL via INTRAVENOUS

## 2022-06-20 MED ORDER — WARFARIN SODIUM 2.5 MG PO TABS
2.5000 mg | ORAL_TABLET | Freq: Once | ORAL | Status: AC
  Administered 2022-06-20: 2.5 mg via ORAL
  Filled 2022-06-20: qty 1

## 2022-06-20 MED ORDER — FUROSEMIDE 10 MG/ML IJ SOLN
40.0000 mg | Freq: Once | INTRAMUSCULAR | Status: AC
  Administered 2022-06-20: 40 mg via INTRAVENOUS
  Filled 2022-06-20: qty 4

## 2022-06-20 MED ORDER — ONDANSETRON HCL 4 MG/2ML IJ SOLN: 4.0000 mg | Freq: Four times a day (QID) | INTRAMUSCULAR | Status: DC | PRN

## 2022-06-20 MED ORDER — FUROSEMIDE 10 MG/ML IJ SOLN
20.0000 mg | Freq: Once | INTRAMUSCULAR | Status: AC
  Administered 2022-06-20: 20 mg via INTRAVENOUS
  Filled 2022-06-20: qty 2

## 2022-06-20 MED ORDER — SODIUM CHLORIDE 0.9% FLUSH
3.0000 mL | Freq: Two times a day (BID) | INTRAVENOUS | Status: DC
  Administered 2022-06-20 – 2022-07-02 (×20): 3 mL via INTRAVENOUS

## 2022-06-20 MED ORDER — MUPIROCIN 2 % EX OINT
TOPICAL_OINTMENT | Freq: Two times a day (BID) | CUTANEOUS | Status: DC
  Filled 2022-06-20 (×3): qty 22

## 2022-06-20 MED ORDER — FUROSEMIDE 10 MG/ML IJ SOLN
40.0000 mg | Freq: Two times a day (BID) | INTRAMUSCULAR | Status: DC
  Administered 2022-06-21 – 2022-06-22 (×3): 40 mg via INTRAVENOUS
  Filled 2022-06-20 (×3): qty 4

## 2022-06-20 MED ORDER — WARFARIN - PHARMACIST DOSING INPATIENT: Freq: Every day | Status: DC

## 2022-06-20 MED ORDER — METOPROLOL SUCCINATE ER 25 MG PO TB24: 25.0000 mg | ORAL_TABLET | Freq: Every day | ORAL | Status: DC

## 2022-06-20 MED ORDER — LIDOCAINE 5 % EX PTCH
1.0000 | MEDICATED_PATCH | CUTANEOUS | Status: DC
  Administered 2022-06-20 – 2022-07-01 (×12): 1 via TRANSDERMAL
  Filled 2022-06-20 (×12): qty 1

## 2022-06-20 MED ORDER — HYDRALAZINE HCL 25 MG PO TABS: 25.0000 mg | ORAL_TABLET | Freq: Four times a day (QID) | ORAL | Status: DC | PRN

## 2022-06-20 MED ORDER — DILTIAZEM HCL ER COATED BEADS 240 MG PO CP24
240.0000 mg | ORAL_CAPSULE | Freq: Every day | ORAL | Status: DC
  Filled 2022-06-20: qty 1

## 2022-06-20 MED ORDER — ACETAMINOPHEN 325 MG PO TABS: 650.0000 mg | ORAL_TABLET | ORAL | Status: DC | PRN

## 2022-06-20 MED ORDER — METOPROLOL TARTRATE 50 MG PO TABS
50.0000 mg | ORAL_TABLET | Freq: Two times a day (BID) | ORAL | Status: DC
  Administered 2022-06-21 – 2022-06-23 (×6): 50 mg via ORAL
  Filled 2022-06-20 (×2): qty 1
  Filled 2022-06-20: qty 2
  Filled 2022-06-20 (×3): qty 1

## 2022-06-20 MED ORDER — SODIUM CHLORIDE 0.9 % IV SOLN: 250.0000 mL | INTRAVENOUS | Status: DC | PRN

## 2022-06-20 NOTE — ED Notes (Signed)
Pt resting comfortably, NAD, calm, interactive with family at [facility]. Pending results for labs and imaging.

## 2022-06-20 NOTE — ED Notes (Signed)
Back from CT, family friend at [facility], no family available, registration into room, pt alert, NAD, calm, answering questions.

## 2022-06-20 NOTE — Consult Note (Signed)
WOC Nurse Consult Note: Reason for Consult:left foot wound Followed by podiatrist at [facility] but it is reported to be right foot wound Wound type:neuropathic Pressure Injury POA: NA Measurement:see RN 2 person skin assessment, when admitted  Wound bed: see nursing flow sheets Drainage (amount, consistency, odor) none Periwound: intact  Dressing procedure/placement/frequency: Cut to fit silver hydrofiber to fit on wound.  Top with foam. Change every other day.    Re consult if needed, will not follow at this time. Thanks  Rashell Shambaugh [employer], RN,CWOCN, CNS, CWON-AP [phone])

## 2022-06-20 NOTE — ED Notes (Signed)
Patient eating with HOB elevated. Patient SPO2 @83  % , placed on 2liters O2 via n/c. No s/s of any distress. No verbal c/o pain or discomfort. Will continue to monitor

## 2022-06-20 NOTE — ED Notes (Signed)
Xray at BS 

## 2022-06-20 NOTE — ED Notes (Signed)
Patient transported to CT 

## 2022-06-20 NOTE — ED Notes (Signed)
Pts lined changed, purewick applied. Pt A&O x2. Pt with skin tear to R shoulder. Extreme weakness with lower body. Pt c/o neck pain. Updated on plan of care. Pt inquiring about food at this time

## 2022-06-20 NOTE — ED Triage Notes (Signed)
BIB [facility] as level 2 trauma for fall on blood thinners, takes coumadin, fell inside x2 this am. First fall was mechanical tripped. 2nd fall was after feeling dizzy and weak. Fell inside closet, head left dent in dry wall. Abrasions noted to posterior head and Bilateral elbows. No active bleeding. No LOC. C-collar in place. NSL 18g L AC. Arrives to full trauma team. Arrives alert, NAD, calm, interactive, MAEx4, abd soft NT, LS CTA, speech clear, afib on monitor with h/o afib, BS 160, VSS. Denies pain or other sx.

## 2022-06-20 NOTE — H&P (Signed)
History and Physical    Hector Vazquez [id number] DOB: [birth year] DOA: 06/20/2022  PCP: Patrecia Pour, Christean Grief, MD (Confirm with patient/family/NH records and if not entered, this has to be entered at [facility] point of entry) Patient coming from: Home  I have personally briefly reviewed patient's old medical records in [place]  Chief Complaint: My legs are so heavy  HPI: Hector Vazquez is a 88 y.o. male with medical history significant of CAD, PAF on Coumadin, HLD, CKD stage IIIb, peripheral neuropathy, peripheral edema secondary to venous insufficiency, came with frequent falls.  Patient has a history of chronic ambulation dysfunction, at baseline uses cane to ambulate.  He has had multiple falls this year with multiple ED visits.  This time, last night, patient started to feel "my legs are so heavy" and this morning, patient became lightheaded when standing up and fell down on his back and hit neck and head.  No LOC.  Denies any pain right now.  He has a chronic PAF, denies any palpitation shortness of breath at this point.  He claimed that his legs were "more swelling two months ago".  He has a chronic right foot ulcer is being cared by [facility] wound care team and stable. Patient lives by himself, no children and wife passed away last year.   ED Course: Blood pressure borderline low, no tachycardia afebrile.  CT head and neck no acute findings.  WBC 10.0, K4.1, creatinine 1.6 compared to baseline 1.7, bicarb 22.  Patient was found to have fluid overload, and 40 mg Lasix given in the ED.  Review of Systems: As per HPI otherwise 14 point review of systems negative.   Past Medical History:  Diagnosis Date   Coronary artery disease     No past surgical history on file.   reports that he has never smoked. He has never used smokeless tobacco. He reports that he does not drink alcohol and does not use drugs.  No Known Allergies  No family history on file.   Prior to Admission  medications   Medication Sig Start Date End Date Taking? Authorizing Provider  b complex vitamins capsule Take 1 capsule by mouth daily. 09/03/18  Yes [provider]  Cholecalciferol 25 MCG (1000 UT) capsule Take 1,000 Units by mouth daily. 11/17/13  Yes [provider]  diltiazem (CARDIZEM CD) 240 MG 24 hr capsule Take 240 mg by mouth daily. 03/09/22  Yes [provider]  lovastatin (MEVACOR) 20 MG tablet Take 20 mg by mouth daily. 12/12/20  Yes [provider]  metoprolol succinate (TOPROL-XL) 25 MG 24 hr tablet Take 25 mg by mouth daily. 02/17/21  Yes [provider]  warfarin (COUMADIN) 2.5 MG tablet Take 2.5 mg by mouth daily. 02/03/21  Yes [provider]  diclofenac Sodium (VOLTAREN) 1 % GEL Apply 4 g topically 4 (four) times daily. Patient not taking: Reported on 06/20/2022 12/18/21   Deno Etienne, DO    Physical Exam: Vitals:   06/20/22 1430 06/20/22 1445 06/20/22 1500 06/20/22 1515  BP: 109/75 106/83 112/65 114/67  Pulse:   77 65  Resp: (!) 24 (!) 27 16 12   Temp:      SpO2:   96% 93%  Weight:        Constitutional: NAD, calm, comfortable Vitals:   06/20/22 1430 06/20/22 1445 06/20/22 1500 06/20/22 1515  BP: 109/75 106/83 112/65 114/67  Pulse:   77 65  Resp: (!) 24 (!) 27 16 12   Temp:  SpO2:   96% 93%  Weight:       Eyes: PERRL, lids and conjunctivae normal ENMT: Mucous membranes are moist. Posterior pharynx clear of any exudate or lesions.Normal dentition.  Neck: normal, supple, no masses, no thyromegaly Respiratory: clear to auscultation bilaterally, no wheezing, no crackles. Normal respiratory effort. No accessory muscle use.  Cardiovascular: Irregular heartbeat, no murmurs / rubs / gallops. 2+ extremity edema. 2+ pedal pulses. No carotid bruits.  Abdomen: no tenderness, no masses palpated. No hepatosplenomegaly. Bowel sounds positive.  Musculoskeletal: no clubbing / cyanosis. No joint deformity upper and lower  extremities. Good ROM, no contractures. Normal muscle tone.  Skin: no rashes, lesions, ulcers. No induration Neurologic: CN 2-12 grossly intact. Sensation intact, DTR normal. Strength 5/5 in all 4.  Psychiatric: Normal judgment and insight. Alert and oriented x 3. Normal mood.     Labs on Admission: I have personally reviewed following labs and imaging studies  CBC: Recent Labs  Lab 06/20/22 0940  WBC 10.0  NEUTROABS 7.8*  HGB 12.5*  HCT 37.2*  MCV 103.6*  PLT 123456   Basic Metabolic Panel: Recent Labs  Lab 06/20/22 0940  NA 142  K 4.1  CL 109  CO2 22  GLUCOSE 95  BUN 30*  CREATININE 1.66*  CALCIUM 8.7*   GFR: CrCl cannot be calculated (Unknown ideal weight.). Liver Function Tests: Recent Labs  Lab 06/20/22 0940  AST 27  ALT 17  ALKPHOS 30*  BILITOT 0.9  PROT 6.0*  ALBUMIN 3.5   No results for input(s): "LIPASE", "AMYLASE" in the last 168 hours. No results for input(s): "AMMONIA" in the last 168 hours. Coagulation Profile: Recent Labs  Lab 06/20/22 0940  INR 2.8*   Cardiac Enzymes: No results for input(s): "CKTOTAL", "CKMB", "CKMBINDEX", "TROPONINI" in the last 168 hours. BNP (last 3 results) No results for input(s): "PROBNP" in the last 8760 hours. HbA1C: No results for input(s): "HGBA1C" in the last 72 hours. CBG: No results for input(s): "GLUCAP" in the last 168 hours. Lipid Profile: No results for input(s): "CHOL", "HDL", "LDLCALC", "TRIG", "CHOLHDL", "LDLDIRECT" in the last 72 hours. Thyroid Function Tests: No results for input(s): "TSH", "T4TOTAL", "FREET4", "T3FREE", "THYROIDAB" in the last 72 hours. Anemia Panel: No results for input(s): "VITAMINB12", "FOLATE", "FERRITIN", "TIBC", "IRON", "RETICCTPCT" in the last 72 hours. Urine analysis:    Component Value Date/Time   COLORURINE YELLOW 06/20/2022 [place] 06/20/2022 1407   LABSPEC 1.012 06/20/2022 1407   PHURINE 6.0 06/20/2022 1407   GLUCOSEU NEGATIVE 06/20/2022 1407    HGBUR MODERATE (A) 06/20/2022 1407   BILIRUBINUR NEGATIVE 06/20/2022 1407   KETONESUR 5 (A) 06/20/2022 1407   PROTEINUR NEGATIVE 06/20/2022 1407   NITRITE NEGATIVE 06/20/2022 1407   LEUKOCYTESUR NEGATIVE 06/20/2022 1407    Radiological Exams on Admission: CT CERVICAL SPINE WO CONTRAST  Result Date: 06/20/2022 CLINICAL DATA:  Polytrauma, blunt.  Fall. EXAM: CT CERVICAL SPINE WITHOUT CONTRAST TECHNIQUE: Multidetector CT imaging of the cervical spine was performed without intravenous contrast. Multiplanar CT image reconstructions were also generated. RADIATION DOSE REDUCTION: This exam was performed according to the departmental dose-optimization program which includes automated exposure control, adjustment of the mA and/or kV according to patient size and/or use of iterative reconstruction technique. COMPARISON:  None Available. FINDINGS: Alignment: Normal Skull base and vertebrae: No acute fracture. No primary bone lesion or focal pathologic process. Soft tissues and spinal canal: No prevertebral fluid or swelling. No visible canal hematoma. Disc levels: Mild degenerative disc disease, most pronounced  at C5-6 and C6-7 with disc space narrowing and spurring. Advanced degenerative facet disease diffusely, left greater than right. Upper chest: No acute findings Other: None IMPRESSION: Degenerative disc and facet disease. No acute bony abnormality. Electronically Signed   By: Charlett Nose M.D.   On: 06/20/2022 10:27   CT HEAD WO CONTRAST ( )  Result Date: 06/20/2022 CLINICAL DATA:  Head trauma, minor (Age >= 65y).  Fall EXAM: CT HEAD WITHOUT CONTRAST TECHNIQUE: Contiguous axial images were obtained from the base of the skull through the vertex without intravenous contrast. RADIATION DOSE REDUCTION: This exam was performed according to the departmental dose-optimization program which includes automated exposure control, adjustment of the mA and/or kV according to patient size and/or use of iterative  reconstruction technique. COMPARISON:  None Available. FINDINGS: Brain: Age related volume loss/atrophy. No acute intracranial abnormality. Specifically, no hemorrhage, hydrocephalus, mass lesion, acute infarction, or significant intracranial injury. Vascular: No hyperdense vessel or unexpected calcification. Skull: No acute calvarial abnormality. Sinuses/Orbits: No acute findings Other: None IMPRESSION: No acute intracranial abnormality. Electronically Signed   By: Charlett Nose M.D.   On: 06/20/2022 10:23   DG Pelvis Portable  Result Date: 06/20/2022 CLINICAL DATA:  Fall EXAM: PORTABLE PELVIS 1-2 VIEWS COMPARISON:  None Available. FINDINGS: Surgical changes of right total hip arthroplasty are identified. Normal alignment. No acute fracture or dislocation. Numerous surgical clips are noted within the pelvis bilaterally. IMPRESSION: 1. Right total hip arthroplasty. No acute fracture or dislocation. Electronically Signed   By: Helyn Numbers M.D.   On: 06/20/2022 10:11   DG Elbow Complete Left  Result Date: 06/20/2022 CLINICAL DATA:  Status post fall, elbow pain EXAM: LEFT ELBOW - COMPLETE 3+ VIEW COMPARISON:  None Available. FINDINGS: No acute fracture or dislocation. No aggressive osseous lesion. Normal alignment. Enthesopathic changes at the triceps tendon insertion. Soft tissue are unremarkable. No radiopaque foreign body or soft tissue emphysema. IMPRESSION: 1. No acute osseous injury of the left elbow. Electronically Signed   By: Elige Ko M.D.   On: 06/20/2022 09:59   DG Chest Portable 1 View  Result Date: 06/20/2022 CLINICAL DATA:  Fall EXAM: PORTABLE CHEST 1 VIEW COMPARISON:  07/02/2008 FINDINGS: Generous heart size accentuated by rotation and low volumes. Widening of the upper right mediastinum that is stable and likely from ectatic vessels. Mild linear opacity at the right base. Lung volumes are low. No edema, effusion, or pneumothorax. No visible fracture IMPRESSION: Low volume chest with  mild atelectasis.  No visible injury. Electronically Signed   By: Tiburcio Pea M.D.   On: 06/20/2022 09:56    EKG: Independently reviewed.  A-fib with frequent PVCs.  Assessment/Plan Principal Problem:   CHF (congestive heart failure) (HCC) Active Problems:   Acute on chronic systolic CHF (congestive heart failure) (HCC)   Impaired ambulation  (please populate well all problems here in Problem List. (For example, if patient is on BP meds at home and you resume or decide to hold them, it is a problem that needs to be her. Same for CAD, COPD, HLD and so on)  Near syncope and fall -Appears to have vasovagal signs before the fall -Check orthostatic vital signs -Blood pressure borderline low, will hold off long-acting Cardizem. -PT evaluation  Acute on chronic HF R EF decompensation -Echo in 2021 at care everywhere showed borderline low LVEF -Clinically patient has fluid overload, raised the concerns about right sided failure -Continue Lasix 40 mg IV twice daily, follow-up kidney function -Echocardiogram -Hold off Cardizem for now  Hypokalemia -P.o. replacement, check magnesium and phosphorus level  HTN -Hold off Cardizem, increase metoprolol to 50 mg twice daily -PRN Hydralazine  PAF -Hold off Cardizem, increase metoprolol -Continue Coumadin  CKD stage IIIb -Creatinine level stable, clinically patient is fluid overloaded -On IV Lasix, follow-up daily kidney function  DVT prophylaxis: Coumadin Code Status: DNR Family Communication: none at bedside Disposition Plan: Elderly patient came with CHF decompensation and near syncope, requiring IV diuresis and CHF workup inpatient, expect less than 2 midnight hospital stay. Consults called: None Admission status: Tele admit   Lequita Halt MD [facility] Pager [phone]  06/20/2022, 3:39 PM

## 2022-06-20 NOTE — ED Provider Notes (Signed)
[facility] EMERGENCY DEPARTMENT Provider Note   CSN: [id number] Arrival date & time: 06/20/22  9811     History No chief complaint on file.   HPI Refugio Rozell is a 88 y.o. male presenting for ground-level fall.  He is a 89 year old male with a extensive medical history.  He is on warfarin for DVTs.  He lost his balance in the closet this morning fell backwards hit his head on the wall. He had loss of sensation in his legs though that has resolved on arrival.  He denies fevers or chills, nausea vomiting, syncope shortness of breath.  He has no complaints at this time..   Patient's recorded medical, surgical, social, medication list and allergies were reviewed in the Snapshot window as part of the initial history.   Review of Systems   Review of Systems  Constitutional:  Negative for chills and fever.  HENT:  Negative for ear pain and sore throat.   Eyes:  Negative for pain and visual disturbance.  Respiratory:  Negative for cough and shortness of breath.   Cardiovascular:  Negative for chest pain and palpitations.  Gastrointestinal:  Negative for abdominal pain and vomiting.  Genitourinary:  Negative for dysuria and hematuria.  Musculoskeletal:  Negative for arthralgias and back pain.  Skin:  Negative for color change and rash.  Neurological:  Negative for seizures and syncope.  All other systems reviewed and are negative.   Physical Exam Updated Vital Signs There were no vitals taken for this visit. Physical Exam Vitals and nursing note reviewed.  Constitutional:      General: He is not in acute distress.    Appearance: He is well-developed.  HENT:     Head: Normocephalic and atraumatic.  Eyes:     Conjunctiva/sclera: Conjunctivae normal.  Cardiovascular:     Rate and Rhythm: Normal rate and regular rhythm.     Heart sounds: No murmur heard. Pulmonary:     Effort: Pulmonary effort is normal. No respiratory distress.     Breath sounds: Normal  breath sounds.  Abdominal:     Palpations: Abdomen is soft.     Tenderness: There is no abdominal tenderness.  Musculoskeletal:        General: Signs of injury (Bruising to LUE, abrasion to scalp) present. No swelling.     Cervical back: Neck supple.  Skin:    General: Skin is warm and dry.     Capillary Refill: Capillary refill takes less than 2 seconds.  Neurological:     Mental Status: He is alert.  Psychiatric:        Mood and Affect: Mood normal.      ED Course/ Medical Decision Making/ A&P    Procedures Procedures   Medications Ordered in ED Medications - No data to display  Medical Decision Making:    Desean Rochford is a 88 y.o. male who presented to the ED today with *** detailed above.     {crccomplexity:27900}  Complete initial physical exam performed, notably the patient  was ***.      Reviewed and confirmed nursing documentation for past medical history, family history, social history.    Initial Assessment:   With the patient's presentation of ***, most likely diagnosis is ***. Other diagnoses were considered including (but not limited to) ***. These are considered less likely due to history of present illness and physical exam findings.   {crccopa:27899}  Initial Plan:  ***  ***Screening labs including CBC and Metabolic panel to evaluate  for infectious or metabolic etiology of disease.  ***Urinalysis with reflex culture ordered to evaluate for UTI or relevant urologic/nephrologic pathology.  ***CXR to evaluate for structural/infectious intrathoracic pathology.  ***EKG to evaluate for cardiac pathology. Objective evaluation as below reviewed with plan for close reassessment  Initial Study Results:   Laboratory  All laboratory results reviewed without evidence of clinically relevant pathology.   ***Exceptions include: ***   ***EKG EKG was reviewed independently. Rate, rhythm, axis, intervals all examined and without medically relevant abnormality. ST  segments without concerns for elevations.    Radiology  All images reviewed independently. ***Agree with radiology report at this time.   No results found.   Consults:  Case discussed with ***.   Final Assessment and Plan:   ***   ***  Clinical Impression: No diagnosis found.   Data Unavailable   Final Clinical Impression(s) / ED Diagnoses Final diagnoses:  None    Rx / DC Orders ED Discharge Orders     None

## 2022-06-20 NOTE — ED Notes (Signed)
EDP in to speak with pt and friend at [facility]

## 2022-06-20 NOTE — Progress Notes (Signed)
ANTICOAGULATION CONSULT NOTE - Initial Consult  Pharmacy Consult for Warfarin Indication: atrial fibrillation  No Known Allergies  Patient Measurements: Weight: 99.8 kg (220 lb)  Vital Signs: Temp: 98.4 F (36.9 C) (11/29 0938) BP: 114/67 (11/29 1515) Pulse Rate: 65 (11/29 1515)  Labs: Recent Labs    06/20/22 0940 06/20/22 1139  HGB 12.5*  --   HCT 37.2*  --   PLT 266  --   LABPROT 29.5*  --   INR 2.8*  --   CREATININE 1.66*  --   TROPONINIHS 27* 28*    CrCl cannot be calculated (Unknown ideal weight.).   Medical History: Past Medical History:  Diagnosis Date   Coronary artery disease     Medications:  (Not in a hospital admission)   Assessment: 88 yo M presented to ED as a level 2 trauma s/p a mechanical fall, followed by a second fall 2/2 to dizziness and weakness on 06/20/22. Abrasions noted to posterior head and bilateral elbows and no active bleeding noted on presentation to ED. Pt has h/o Afib and is on warfarin PTA.  Home warfarin regimen is 2.5mg  po daily except for 1.25mg  on Thursdays, last dose 11/28 @ 06:30. Pt confirms that he did not have his dose on 11/29.   INR 2.8 on admission Hgb 12.5, Plt 266 - stable No s/sx of bleeding  Goal of Therapy:  INR 2-3 Monitor platelets by anticoagulation protocol: Yes   Plan:  Give warfarin 2.5mg  PO x1 tonight - continue patient's home regimen with therapeutic INR Monitor daily CBC, INR, and for s/sx of bleeding   Wilburn Cornelia, PharmD, BCPS Clinical Pharmacist 06/20/2022 4:08 PM   Please refer to [facility] for pharmacy phone number

## 2022-06-21 ENCOUNTER — Inpatient Hospital Stay (HOSPITAL_COMMUNITY): Payer: Medicare HMO

## 2022-06-21 DIAGNOSIS — I5023 Acute on chronic systolic (congestive) heart failure: Secondary | ICD-10-CM

## 2022-06-21 DIAGNOSIS — I50813 Acute on chronic right heart failure: Secondary | ICD-10-CM

## 2022-06-21 LAB — ECHOCARDIOGRAM COMPLETE
AR max vel: 2.72 cm2
AV Area VTI: 2.37 cm2
AV Area mean vel: 2.65 cm2
AV Mean grad: 3 mmHg
AV Peak grad: 4.8 mmHg
Ao pk vel: 1.09 m/s
Area-P 1/2: 4.6 cm2
Calc EF: 37.8 %
MV M vel: 4.51 m/s
MV Peak grad: 81.4 mmHg
P 1/2 time: 614 msec
S' Lateral: 4 cm
Single Plane A2C EF: 37.6 %
Single Plane A4C EF: 40.8 %
Weight: 3520 oz

## 2022-06-21 LAB — PHOSPHORUS: Phosphorus: 3.1 mg/dL (ref 2.5–4.6)

## 2022-06-21 LAB — BASIC METABOLIC PANEL
Anion gap: 13 (ref 5–15)
BUN: 28 mg/dL — ABNORMAL HIGH (ref 8–23)
CO2: 26 mmol/L (ref 22–32)
Calcium: 8.9 mg/dL (ref 8.9–10.3)
Chloride: 102 mmol/L (ref 98–111)
Creatinine, Ser: 1.84 mg/dL — ABNORMAL HIGH (ref 0.61–1.24)
GFR, Estimated: 34 mL/min — ABNORMAL LOW (ref >60)
Glucose, Bld: 105 mg/dL — ABNORMAL HIGH (ref 70–99)
Potassium: 3.7 mmol/L (ref 3.5–5.1)
Sodium: 141 mmol/L (ref 135–145)

## 2022-06-21 LAB — CBC
HCT: 39.4 % (ref 39.0–52.0)
Hemoglobin: 12.7 g/dL — ABNORMAL LOW (ref 13.0–17.0)
MCH: 33.4 pg (ref 26.0–34.0)
MCHC: 32.2 g/dL (ref 30.0–36.0)
MCV: 103.7 fL — ABNORMAL HIGH (ref 80.0–100.0)
Platelets: 287 10*3/uL (ref 150–400)
RBC: 3.8 MIL/uL — ABNORMAL LOW (ref 4.22–5.81)
RDW: 14.2 % (ref 11.5–15.5)
WBC: 11.2 10*3/uL — ABNORMAL HIGH (ref 4.0–10.5)
nRBC: 0 % (ref 0.0–0.2)

## 2022-06-21 LAB — PROTIME-INR
INR: 2.8 — ABNORMAL HIGH (ref 0.8–1.2)
Prothrombin Time: 29 seconds — ABNORMAL HIGH (ref 11.4–15.2)

## 2022-06-21 LAB — MAGNESIUM: Magnesium: 2.2 mg/dL (ref 1.7–2.4)

## 2022-06-21 MED ORDER — WARFARIN 1.25 MG HALF TABLET
1.2500 mg | ORAL_TABLET | Freq: Once | ORAL | Status: AC
  Administered 2022-06-21: 1.25 mg via ORAL
  Filled 2022-06-21 (×2): qty 1

## 2022-06-21 MED ORDER — PERFLUTREN LIPID MICROSPHERE
1.0000 mL | INTRAVENOUS | Status: AC | PRN
  Administered 2022-06-21: 2 mL via INTRAVENOUS

## 2022-06-21 NOTE — Progress Notes (Signed)
  Echocardiogram 2D Echocardiogram has been performed.  Hector Vazquez 06/21/2022, 1:54 PM

## 2022-06-21 NOTE — Evaluation (Signed)
Physical Therapy Evaluation Patient Details Name: Hector Vazquez MRN: [id number] DOB: Sep 29, 1929 Today's Date: 06/21/2022  History of Present Illness  Hector Vazquez is a 88 y.o. male with medical history significant of CAD, PAF on Coumadin, HLD, CKD stage IIIb, peripheral neuropathy, peripheral edema secondary to venous insufficiency, came with frequent falls.  Clinical Impression  Patient presents with decreased mobility due to pain in feet, decreased balance, decreased activity tolerance and recent history of falls.  He normally lives alone and performs ADL/IADL's on his own going out to get meals and showering, etc.  Currently min to mod A for in room ambulation with greatly increased time due to pain in his feet and generalized soreness from the fall.  Feel he will benefit from skilled PT in the acute setting and from follow up STSNF level rehab at d/c.         Recommendations for follow up therapy are one component of a multi-disciplinary discharge planning process, led by the attending physician.  Recommendations may be updated based on patient status, additional functional criteria and insurance authorization.  Follow Up Recommendations Skilled nursing-short term rehab (<3 hours/day) Can patient physically be transported by private vehicle: Yes    Assistance Recommended at Discharge    Patient can return home with the following  A little help with walking and/or transfers;Assistance with cooking/housework;Assist for transportation;Help with stairs or ramp for entrance;A little help with bathing/dressing/bathroom    Equipment Recommendations None recommended by PT  Recommendations for Other Services       Functional Status Assessment Patient has had a recent decline in their functional status and demonstrates the ability to make significant improvements in function in a reasonable and predictable amount of time.     Precautions / Restrictions Precautions Precautions: Fall Precaution  Comments: 2 recent per pt Required Braces or Orthoses: Other Brace Other Brace: wears heel cups in his shoes due to heel pain; R foot with ulcer under first met head      Mobility  Bed Mobility Overal bed mobility: Needs Assistance Bed Mobility: Sidelying to Sit   Sidelying to sit: HOB elevated, Min assist       General bed mobility comments: cues and greatly increased time for technique due to had neck pain since fall educated on log roll, etc    Transfers Overall transfer level: Needs assistance Equipment used: Rolling walker (2 wheels) Transfers: Sit to/from Stand Sit to Stand: From elevated surface, Mod assist           General transfer comment: some lifting help and support for balance with posterior bias coming upright    Ambulation/Gait Ambulation/Gait assistance: Min assist Gait Distance (Feet): 20 Feet Assistive device: Rolling walker (2 wheels) Gait Pattern/deviations: Step-to pattern, Antalgic, Wide base of support, Trunk flexed, Decreased stride length, Decreased stance time - right       General Gait Details: Greatly increased time to tak his first step and stopping several times to explain about his feet and why difficult to take steps and how he knows he has a lot to learn with using the walker and to describe how he fell second time.  Encouraged to continue and made it to sink with chair following  Stairs            Wheelchair Mobility    Modified Rankin (Stroke Patients Only)       Balance Overall balance assessment: Needs assistance   Sitting balance-Leahy Scale: Fair Sitting balance - Comments: initially posterior and needing UE  support, after donned socks and shoes for him at EOB and pt able to scoot to EOB improved anterior weight shift   Standing balance support: Bilateral upper extremity supported Standing balance-Leahy Scale: Poor Standing balance comment: Initial posterior bias with mod A for balance, improved to S with UE  support on RW                             Pertinent Vitals/Pain Pain Assessment Pain Assessment: Faces Faces Pain Scale: Hurts even more Pain Location: R>L foot with weight bearing Pain Descriptors / Indicators: Discomfort, Guarding, Sore Pain Intervention(s): Monitored during session, Limited activity within patient's tolerance    Home Living Family/patient expects to be discharged to:: Private residence Living Arrangements: Alone   Type of Home: House Home Access: Stairs to enter   Technical brewer of Steps: 1   Home Layout: One level Home Equipment: Conservation officer, nature (2 wheels);Cane - single point      Prior Function Prior Level of Function : History of Falls (last six months);Driving;Independent/Modified Independent                     Hand Dominance        Extremity/Trunk Assessment   Upper Extremity Assessment Upper Extremity Assessment: Generalized weakness    Lower Extremity Assessment Lower Extremity Assessment: Generalized weakness    Cervical / Trunk Assessment Cervical / Trunk Assessment: Kyphotic  Communication   Communication: HOH  Cognition Arousal/Alertness: Awake/alert Behavior During Therapy: WFL for tasks assessed/performed Overall Cognitive Status: No family/caregiver present to determine baseline cognitive functioning                                 General Comments: oriented and explaining falls at home well, but hyperverbal almost as if trying to cover for deficits        General Comments General comments (skin integrity, edema, etc.): Noted wrinkling of legs and pt relates much improved edema from his baseline; noted area under first metatarsal on R foot with dressing and tenderness and soft heels on both feet.  HR max 117 with ambulation in room, no dyspnea noted.  Relayed situation of one fall in garage getting out of his car and garage door closed and he hit the start button and started the car, his  phone went far away and he was worried about carbon monoxide poisoning; yelled till neighbors heard him.    Exercises     Assessment/Plan    PT Assessment Patient needs continued PT services  PT Problem List Decreased strength;Decreased balance;Decreased cognition;Decreased knowledge of precautions;Decreased mobility;Decreased activity tolerance;Decreased safety awareness       PT Treatment Interventions DME instruction;Functional mobility training;Balance training;Patient/family education;Therapeutic activities;Gait training;Therapeutic exercise    PT Goals (Current goals can be found in the Care Plan section)  Acute Rehab PT Goals Patient Stated Goal: agreeable to short term rehab; hopeful to return to independent PT Goal Formulation: With patient Time For Goal Achievement: 07/05/22 Potential to Achieve Goals: Fair    Frequency Min 3X/week     Co-evaluation               AM-PAC PT "6 Clicks" Mobility  Outcome Measure Help needed turning from your back to your side while in a flat bed without using bedrails?: A Little Help needed moving from lying on your back to sitting on the side of  a flat bed without using bedrails?: A Little Help needed moving to and from a bed to a chair (including a wheelchair)?: A Little Help needed standing up from a chair using your arms (e.g., wheelchair or bedside chair)?: A Lot Help needed to walk in hospital room?: A Little Help needed climbing 3-5 steps with a railing? : Total 6 Click Score: 15    End of Session Equipment Utilized During Treatment: Gait belt Activity Tolerance: Patient limited by fatigue;Patient limited by pain Patient left: in chair;with call bell/phone within reach Nurse Communication: Mobility status PT Visit Diagnosis: Other abnormalities of gait and mobility (R26.89);Repeated falls (R29.6);History of falling (Z91.81)    Time: 6720-9198 PT Time Calculation (min) (ACUTE ONLY): 66 min   Charges:   PT  Evaluation $PT Eval Moderate Complexity: 1 Mod PT Treatments $Gait Training: 23-37 mins $Therapeutic Activity: 8-22 mins        Magda Kiel, PT Acute Rehabilitation Services [phone] 06/21/2022   Reginia Naas 06/21/2022, 4:34 PM

## 2022-06-21 NOTE — TOC CAGE-AID Note (Signed)
Transition of Care [facility]) - CAGE-AID Screening   Patient Details  Name: Hector Vazquez MRN: [id number] Date of Birth: Jan 10, 1930  Transition of Care (TOC) CM/SW Contact:    Katha Hamming, RN Phone Number:[phone] 06/21/2022, 10:28 PM    CAGE-AID Screening:    Have You Ever Felt You Ought to Cut Down on Your Drinking or Drug Use?: No Have People Annoyed You By Critizing Your Drinking Or Drug Use?: No Have You Felt Bad Or Guilty About Your Drinking Or Drug Use?: No Have You Ever Had a Drink or Used Drugs First Thing In The Morning to Steady Your Nerves or to Get Rid of a Hangover?: No CAGE-AID Score: 0  Substance Abuse Education Offered: No (no hx of alcohol/drug use, no resources indicated)

## 2022-06-21 NOTE — ED Notes (Signed)
Patient resting, no s/s of any distress. No verbal c/o pain or discomfort. VSS. Report given to oncoming nurse.

## 2022-06-21 NOTE — Consult Note (Addendum)
WOC consult requested for right foot wound.  This was already performed yesterday; please refer to previous consult note, and topical treatment orders have been provided for bedside nurses to perform. Please re-consult if further assistance is needed.  Thank-you,  Cammie Mcgee MSN, RN, CWOCN, [place], CNS [phone]

## 2022-06-21 NOTE — Hospital Course (Addendum)
88 y.o.m w/ history of CAD, PAF on Coumadin, HLD, CKD stage IIIb, peripheral neuropathy, peripheral edema secondary to venous insufficiency, chronic ambulatory dysfunction at baseline using cane to ambulate presented with multiple fall He has had multiple falls this year with multiple ED visits patient started to feel "my legs are so heavy" night before and next morning 06/20/22 became lightheaded standing up and fell down on his back and hit his neck and head without loss of consciousness.  Denies any preceding palpitation shortness of breath.  Claimed that his liquid more swollen in 2 months has chronic right foot ulcer care at [facility] wound care.  Lives by himself with no children and wife ED Course: Blood pressure borderline low, no tachycardia afebrile.  CT head and neck no acute findings.  WBC 10.0, K4.1, creatinine 1.6 compared to baseline 1.7, bicarb 22. Patient was found to have fluid overload, and 40 mg Lasix given in the ED Patient was admitted for near syncope and fall, orthostatic vitals ordered, also admitted with right-sided CHF significant peripheral edema given Lasix, echo ordered renal function repeated

## 2022-06-21 NOTE — Progress Notes (Signed)
ANTICOAGULATION CONSULT NOTE - Initial Consult  Pharmacy Consult for Warfarin Indication: atrial fibrillation  No Known Allergies  Patient Measurements: Weight: 99.8 kg (220 lb)  Vital Signs: Temp: 98.7 F (37.1 C) (11/30 0750) Temp Source: Oral (11/30 0750) BP: 102/62 (11/30 1000) Pulse Rate: 81 (11/30 1000)  Labs: Recent Labs    06/20/22 0940 06/20/22 1139 06/21/22 0619  HGB 12.5*  --  12.7*  HCT 37.2*  --  39.4  PLT 266  --  287  LABPROT 29.5*  --  29.0*  INR 2.8*  --  2.8*  CREATININE 1.66*  --  1.84*  TROPONINIHS 27* 28*  --      CrCl cannot be calculated (Unknown ideal weight.).   Medical History: Past Medical History:  Diagnosis Date   Coronary artery disease     Medications:  (Not in a hospital admission)  Assessment: 88 yo M presented to ED as a level 2 trauma s/p a mechanical fall, followed by a second fall 2/2 to dizziness and weakness on 06/20/22. Abrasions noted to posterior head and bilateral elbows and no active bleeding noted on presentation to ED. Pt has h/o Afib and is on warfarin PTA.  Home warfarin regimen is 2.5mg  po daily except for 1.25mg  on Thursdays, last dose 11/28 @ 06:30.   INR therapeutic this morning at 2.8. CBC is stable. No signs/symptoms of bleeding noted.  Goal of Therapy:  INR 2-3 Monitor platelets by anticoagulation protocol: Yes   Plan:  Give warfarin 1.25mg  PO x1 at 1600 - continue patient's home regimen with therapeutic INR Monitor daily CBC, INR, and for s/sx of bleeding  Rockwell Alexandria, PharmD, [facility] PGY1 Pharmacy Resident 06/21/2022 11:23 AM

## 2022-06-21 NOTE — Progress Notes (Signed)
PROGRESS NOTE Hector Vazquez  [id number] DOB: [birth year] DOA: 06/20/2022 PCP: Doreatha Lew, MD   Brief Narrative/Hospital Course:  88 y.o.m w/ history of CAD, PAF on Coumadin, HLD, CKD stage IIIb, peripheral neuropathy, peripheral edema secondary to venous insufficiency, chronic ambulatory dysfunction at baseline using cane to ambulate presented with multiple fall He has had multiple falls this year with multiple ED visits patient started to feel "my legs are so heavy" night before and next morning 06/20/22 became lightheaded standing up and fell down on his back and hit his neck and head without loss of consciousness.  Denies any preceding palpitation shortness of breath.  Claimed that his liquid more swollen in 2 months has chronic right foot ulcer care at [facility] wound care.  Lives by himself with no children and wife ED Course: Blood pressure borderline low, no tachycardia afebrile.  CT head and neck no acute findings.  WBC 10.0, K4.1, creatinine 1.6 compared to baseline 1.7, bicarb 22. Patient was found to have fluid overload, and 40 mg Lasix given in the ED Patient was admitted for near syncope and fall, orthostatic vitals ordered, also admitted with right-sided CHF significant peripheral edema given Lasix, echo ordered renal function repeated    Subjective: Seen and examined this morning. Feels better today On2l -not on home oxygen States he lives alone. Rt foot wound with ace wrap+ Chronic leg edema on b/l LE  Assessment and Plan: Principal Problem:   CHF (congestive heart failure) (HCC) Active Problems:   Acute on chronic systolic CHF (congestive heart failure) (HCC)   Impaired ambulation  Near syncope and fall Multiple falls at home: Likely multifactorial in the setting of patient's congestive heart failure, leg edema, wound.  Suspect deconditioning this and debility.  Continue to address underlying condition check TSH B12. vitamin D normal in October.  Request PT  OT evaluation check orthostatic vitals, echocardiogram  Acute on chronic CHF with right-sided heart failure BNP elevated 741 with worsening peripheral edema, chest x-ray low volume chest with mild atelectasis.Placed on IV Lasix 40 mg twice daily, on metoprolol.  Follow-up echocardiogram, monitor intake output Daily weight.  Followed by Dr. Ola Spurr at [facility]. Net IO Since Admission: -1,000 mL [06/21/22 1103]  Filed Weights   06/20/22 1008  Weight: 99.8 kg   Acute hypoxic respiratory failure saturation 83% overnight placed on 2 L nasal cannula.  Continue diuresis. Troponin 27-28 flat likely demand ischemia from CHF  Hypokalemia: Resolved  Hypertension: Well-controlled continue metoprolol.  PAF on Coumadin.  Metoprolol increased Cardizem discontinued.  Pharmacy dosing Coumadin INR therapeutic as below Recent Labs  Lab 06/20/22 0940 06/21/22 0619  INR 2.8* 2.8*    Elevated intact PTH 110, a month ago  CKD stage IIIb: Baseline creatinine 1.6-1.8 in careeverywhere  since 2022. Recent Labs    06/20/22 0940 06/21/22 0619  BUN 30* 28*  CREATININE 1.66* 1.84*   Chronic right leg wound, consult wound care.  Follow up at [facility] DVT prophylaxis:  Code Status:   Code Status: DNR Family Communication: plan of care discussed with patient at bedside. Patient status is: Inpatient because of congestive failure management Level of care: Telemetry Medical   Dispo: The patient is from: Home lives alone            Anticipated disposition: Anticipate placement to skilled nursing facility Mobility Assessment (last 72 hours)     Mobility Assessment     Row Name 06/21/22 [phone]  Does patient have an order for bedrest or is patient medically unstable No - Continue assessment       What is the highest level of mobility based on the progressive mobility assessment? Level 3 (Stands with assist) - Balance while standing  and cannot march in place                  Objective: Vitals last 24 hrs: Vitals:   06/21/22 0915 06/21/22 0928 06/21/22 0930 06/21/22 1000  BP: (!) 100/56 (!) 98/47 108/83 102/62  Pulse: (!) 53  (!) 101 81  Resp: 17 18 18  (!) 22  Temp:      TempSrc:      SpO2: 99%  94% 100%  Weight:       Weight change:   Physical Examination: General exam: alert awake, older than stated age HEENT:Oral mucosa moist, Ear/Nose WNL grossly Respiratory system: bilaterally clear BS, no use of accessory muscle Cardiovascular system: S1 & S2 +, No JVD. Gastrointestinal system: Abdomen soft,NT,ND, BS+ Nervous System:Alert, awake, moving extremities. Extremities: LE edema +, wound on RLE,distal peripheral pulses palpable.  Skin: No rashes,no icterus. MSK: Normal muscle bulk,tone, power  Medications reviewed:  Scheduled Meds:  furosemide  40 mg Intravenous BID   lidocaine  1 patch Transdermal Q24H   metoprolol tartrate  50 mg Oral BID   mupirocin ointment   Nasal BID   pravastatin  20 mg Oral q1800   sodium chloride flush  3 mL Intravenous Q12H   Warfarin - Pharmacist Dosing Inpatient   Does not apply q1600  Continuous Infusions:  sodium chloride      Diet Order             Diet Heart Room service appropriate? Yes; Fluid consistency: Thin; Fluid restriction: 2000 mL Fluid  Diet effective now                  Intake/Output Summary (Last 24 hours) at 06/21/2022 1059 Last data filed at 06/20/2022 1759 Gross per 24 hour  Intake --  Output 1000 ml  Net -1000 ml   Net IO Since Admission: -1,000 mL [06/21/22 1059]  Wt Readings from Last 3 Encounters:  06/20/22 99.8 kg  12/19/21 97 kg  12/18/21 97.5 kg     Unresulted Labs (From admission, onward)     Start     Ordered   06/21/22 0500  Basic metabolic panel  Daily at 5am,   R     Comments: As Scheduled for 5 days    06/20/22 1500   06/21/22 0500  Protime-INR  Daily at 5am,   R      06/20/22 1610   06/21/22 0500  CBC  Daily at 5am,   R      06/20/22 1610           Data Reviewed: I have personally reviewed following labs and imaging studies CBC: Recent Labs  Lab 06/20/22 0940 06/21/22 0619  WBC 10.0 11.2*  NEUTROABS 7.8*  --   HGB 12.5* 12.7*  HCT 37.2* 39.4  MCV 103.6* 103.7*  PLT 266 287   Basic Metabolic Panel: Recent Labs  Lab 06/20/22 0940 06/21/22 0619  NA 142 141  K 4.1 3.7  CL 109 102  CO2 22 26  GLUCOSE 95 105*  BUN 30* 28*  CREATININE 1.66* 1.84*  CALCIUM 8.7* 8.9  MG  --  2.2  PHOS  --  3.1   GFR: CrCl cannot be calculated (Unknown ideal weight.). Liver  Function Tests: Recent Labs  Lab 06/20/22 0940  AST 27  ALT 17  ALKPHOS 30*  BILITOT 0.9  PROT 6.0*  ALBUMIN 3.5   Culture/Microbiology No results found for: "SDES", "SPECREQUEST", "CULT", "REPTSTATUS"  Other culture-see note  Radiology Studies: CT CERVICAL SPINE WO CONTRAST  Result Date: 06/20/2022 CLINICAL DATA:  Polytrauma, blunt.  Fall. EXAM: CT CERVICAL SPINE WITHOUT CONTRAST TECHNIQUE: Multidetector CT imaging of the cervical spine was performed without intravenous contrast. Multiplanar CT image reconstructions were also generated. RADIATION DOSE REDUCTION: This exam was performed according to the departmental dose-optimization program which includes automated exposure control, adjustment of the mA and/or kV according to patient size and/or use of iterative reconstruction technique. COMPARISON:  None Available. FINDINGS: Alignment: Normal Skull base and vertebrae: No acute fracture. No primary bone lesion or focal pathologic process. Soft tissues and spinal canal: No prevertebral fluid or swelling. No visible canal hematoma. Disc levels: Mild degenerative disc disease, most pronounced at C5-6 and C6-7 with disc space narrowing and spurring. Advanced degenerative facet disease diffusely, left greater than right. Upper chest: No acute findings Other: None IMPRESSION: Degenerative disc and facet disease. No acute bony abnormality. Electronically Signed   By: Rolm Baptise M.D.   On: 06/20/2022 10:27   CT HEAD WO CONTRAST (5MM)  Result Date: 06/20/2022 CLINICAL DATA:  Head trauma, minor (Age >= 65y).  Fall EXAM: CT HEAD WITHOUT CONTRAST TECHNIQUE: Contiguous axial images were obtained from the base of the skull through the vertex without intravenous contrast. RADIATION DOSE REDUCTION: This exam was performed according to the departmental dose-optimization program which includes automated exposure control, adjustment of the mA and/or kV according to patient size and/or use of iterative reconstruction technique. COMPARISON:  None Available. FINDINGS: Brain: Age related volume loss/atrophy. No acute intracranial abnormality. Specifically, no hemorrhage, hydrocephalus, mass lesion, acute infarction, or significant intracranial injury. Vascular: No hyperdense vessel or unexpected calcification. Skull: No acute calvarial abnormality. Sinuses/Orbits: No acute findings Other: None IMPRESSION: No acute intracranial abnormality. Electronically Signed   By: Rolm Baptise M.D.   On: 06/20/2022 10:23   DG Pelvis Portable  Result Date: 06/20/2022 CLINICAL DATA:  Fall EXAM: PORTABLE PELVIS 1-2 VIEWS COMPARISON:  None Available. FINDINGS: Surgical changes of right total hip arthroplasty are identified. Normal alignment. No acute fracture or dislocation. Numerous surgical clips are noted within the pelvis bilaterally. IMPRESSION: 1. Right total hip arthroplasty. No acute fracture or dislocation. Electronically Signed   By: Fidela Salisbury M.D.   On: 06/20/2022 10:11   DG Elbow Complete Left  Result Date: 06/20/2022 CLINICAL DATA:  Status post fall, elbow pain EXAM: LEFT ELBOW - COMPLETE 3+ VIEW COMPARISON:  None Available. FINDINGS: No acute fracture or dislocation. No aggressive osseous lesion. Normal alignment. Enthesopathic changes at the triceps tendon insertion. Soft tissue are unremarkable. No radiopaque foreign body or soft tissue emphysema. IMPRESSION: 1. No acute osseous  injury of the left elbow. Electronically Signed   By: Kathreen Devoid M.D.   On: 06/20/2022 09:59   DG Chest Portable 1 View  Result Date: 06/20/2022 CLINICAL DATA:  Fall EXAM: PORTABLE CHEST 1 VIEW COMPARISON:  07/02/2008 FINDINGS: Generous heart size accentuated by rotation and low volumes. Widening of the upper right mediastinum that is stable and likely from ectatic vessels. Mild linear opacity at the right base. Lung volumes are low. No edema, effusion, or pneumothorax. No visible fracture IMPRESSION: Low volume chest with mild atelectasis.  No visible injury. Electronically Signed   By: Jorje Guild  M.D.   On: 06/20/2022 09:56     LOS: 1 day   Antonieta Pert, MD [facility]  06/21/2022, 10:59 AM

## 2022-06-22 ENCOUNTER — Inpatient Hospital Stay (HOSPITAL_COMMUNITY): Payer: Medicare HMO

## 2022-06-22 DIAGNOSIS — I5023 Acute on chronic systolic (congestive) heart failure: Secondary | ICD-10-CM | POA: Diagnosis not present

## 2022-06-22 LAB — PROTIME-INR
INR: 3.2 — ABNORMAL HIGH (ref 0.8–1.2)
Prothrombin Time: 32.8 seconds — ABNORMAL HIGH (ref 11.4–15.2)

## 2022-06-22 LAB — BASIC METABOLIC PANEL
Anion gap: 13 (ref 5–15)
BUN: 32 mg/dL — ABNORMAL HIGH (ref 8–23)
CO2: 28 mmol/L (ref 22–32)
Calcium: 8.6 mg/dL — ABNORMAL LOW (ref 8.9–10.3)
Chloride: 100 mmol/L (ref 98–111)
Creatinine, Ser: 1.78 mg/dL — ABNORMAL HIGH (ref 0.61–1.24)
GFR, Estimated: 36 mL/min — ABNORMAL LOW (ref >60)
Glucose, Bld: 119 mg/dL — ABNORMAL HIGH (ref 70–99)
Potassium: 3.2 mmol/L — ABNORMAL LOW (ref 3.5–5.1)
Sodium: 141 mmol/L (ref 135–145)

## 2022-06-22 LAB — CBC
HCT: 35 % — ABNORMAL LOW (ref 39.0–52.0)
Hemoglobin: 11.8 g/dL — ABNORMAL LOW (ref 13.0–17.0)
MCH: 33.8 pg (ref 26.0–34.0)
MCHC: 33.7 g/dL (ref 30.0–36.0)
MCV: 100.3 fL — ABNORMAL HIGH (ref 80.0–100.0)
Platelets: 254 10*3/uL (ref 150–400)
RBC: 3.49 MIL/uL — ABNORMAL LOW (ref 4.22–5.81)
RDW: 14.2 % (ref 11.5–15.5)
WBC: 10.5 10*3/uL (ref 4.0–10.5)
nRBC: 0 % (ref 0.0–0.2)

## 2022-06-22 LAB — MAGNESIUM: Magnesium: 2 mg/dL (ref 1.7–2.4)

## 2022-06-22 MED ORDER — METHOCARBAMOL 1000 MG/10ML IJ SOLN
500.0000 mg | Freq: Three times a day (TID) | INTRAVENOUS | Status: DC | PRN
  Administered 2022-06-22 – 2022-06-23 (×2): 500 mg via INTRAVENOUS
  Filled 2022-06-22: qty 5
  Filled 2022-06-22: qty 500

## 2022-06-22 MED ORDER — ACETAMINOPHEN 500 MG PO TABS
1000.0000 mg | ORAL_TABLET | Freq: Two times a day (BID) | ORAL | Status: DC | PRN
  Administered 2022-06-22 – 2022-07-01 (×8): 1000 mg via ORAL
  Filled 2022-06-22 (×8): qty 2

## 2022-06-22 MED ORDER — MUPIROCIN 2 % EX OINT
TOPICAL_OINTMENT | Freq: Two times a day (BID) | CUTANEOUS | Status: DC
  Administered 2022-06-30: 1 via TOPICAL

## 2022-06-22 MED ORDER — POTASSIUM CHLORIDE CRYS ER 20 MEQ PO TBCR
40.0000 meq | EXTENDED_RELEASE_TABLET | Freq: Once | ORAL | Status: AC
  Administered 2022-06-22: 40 meq via ORAL
  Filled 2022-06-22: qty 2

## 2022-06-22 MED ORDER — HYDROCODONE-ACETAMINOPHEN 5-325 MG PO TABS
1.0000 | ORAL_TABLET | Freq: Four times a day (QID) | ORAL | Status: DC | PRN
  Administered 2022-06-22: 1 via ORAL
  Filled 2022-06-22: qty 1

## 2022-06-22 MED ORDER — FUROSEMIDE 10 MG/ML IJ SOLN
40.0000 mg | Freq: Every day | INTRAMUSCULAR | Status: DC
  Administered 2022-06-23: 40 mg via INTRAVENOUS
  Filled 2022-06-22: qty 4

## 2022-06-22 MED ORDER — POTASSIUM CHLORIDE CRYS ER 20 MEQ PO TBCR
20.0000 meq | EXTENDED_RELEASE_TABLET | Freq: Every day | ORAL | Status: DC
  Administered 2022-06-22 – 2022-07-02 (×11): 20 meq via ORAL
  Filled 2022-06-22 (×11): qty 1

## 2022-06-22 NOTE — Progress Notes (Signed)
Mobility Specialist - Progress Note   06/22/22 1108  Mobility  Activity Off unit   Pt off unit for X-ray. Will follow up if time permits.   Alda Lea  Mobility Specialist Please contact via Special educational needs teacher or Rehab office at [phone]

## 2022-06-22 NOTE — Progress Notes (Signed)
TRH night cross cover note:   I was notified by RN of potassium level of 3.2 this AM.   Per my chart review, this is relative to yesterday's potassium level of 3.7.  Serum creatinine has trended down slightly in the interval.  Particularly given plan for additional IV Lasix to be administered today, I will proceed with order for oral potassium supplementation at this time, and will also add on serum magnesium level.  I have placed order for potassium chloride 40 meq p.o. x1 dose now.    Newton Pigg, DO Hospitalist

## 2022-06-22 NOTE — Care Management Important Message (Signed)
Important Message  Patient Details  Name: Hector Vazquez MRN: [id number] Date of Birth: May 25, 1930   Medicare Important Message Given:  Yes     Renie Ora 06/22/2022, 9:44 AM

## 2022-06-22 NOTE — Progress Notes (Addendum)
PROGRESS NOTE Hector Vazquez  [id number] DOB: Mar 09, 1930 DOA: 06/20/2022 PCP: Doreatha Lew, MD   Brief Narrative/Hospital Course:  88 y.o.m w/ history of CAD, PAF on Coumadin, HLD, CKD stage IIIb, peripheral neuropathy, peripheral edema secondary to venous insufficiency, chronic ambulatory dysfunction at baseline using cane to ambulate presented with multiple fall He has had multiple falls this year with multiple ED visits patient started to feel "my legs are so heavy" night before and next morning 06/20/22 became lightheaded standing up and fell down on his back and hit his neck and head without loss of consciousness.  Denies any preceding palpitation shortness of breath.  Claimed that his liquid more swollen in 2 months has chronic right foot ulcer care at [facility] wound care.  Lives by himself with no children and wife ED Course: Blood pressure borderline low, no tachycardia afebrile.  CT head and neck no acute findings.  WBC 10.0, K4.1, creatinine 1.6 compared to baseline 1.7, bicarb 22. Patient was found to have fluid overload, and 40 mg Lasix given in the ED Patient was admitted for near syncope and fall, orthostatic vitals ordered, also admitted with right-sided CHF significant peripheral edema given Lasix, echo ordered renal function repeated    Subjective:  Seen and examined this morning shortness of breath much better complains of neck pain attributes to the positioning while he was being transported in ambulance.   Overnight patient is afebrile BP stable on room air  Assessment and Plan: Principal Problem:   CHF (congestive heart failure) (HCC) Active Problems:   Acute on chronic systolic CHF (congestive heart failure) (HCC)   Impaired ambulation  Near syncope and fall Multiple falls at home: Likely multifactorial in the setting of patient's congestive heart failure, leg edema, wound.  Suspect deconditioning and debility.  Continue PT OT, F/UTSH B12. vitamin D normal in  October.  Will need skilled nursing facility.  Acute on chronic CHF with right-sided heart failure Acute on chronic systolic CHF EF at [id number] now Edema: BNP elevated 741 with worsening peripheral edema, chest x-ray low volume chest with mild atelectasis.Echo shows EF 40-45%, global hypokinesia, normal RV systolic function.  Echo from 2021 with EF 50-50%. Cont on lasix 40 mg daily, cont metoprolol, monitor intake output Daily weight.  He is followed by Dr. Ola Spurr at [facility]. Net IO Since Admission: -3,850 mL [06/22/22 1315]  Filed Weights   06/20/22 1008  Weight: 99.8 kg   Acute hypoxic respiratory failure saturation 83% overnight placed on 2 L nasal cannula. Doing well on room air.  Troponin 27-28 flat likely demand ischemia from CHF  Hypokalemia: Repleting.  Hypertension: Well-controlled on metoprolol.  PAF on Coumadin.  Metoprolol increased Cardizem discontinued. Pharmacy dosing Coumadin INR therapeutic as below due to monitor. Recent Labs  Lab 06/20/22 0940 06/21/22 0619 06/22/22 0238  INR 2.8* 2.8* 3.2*   Elevated intact PTH 110, a month ago  CKD stage IIIb: Baseline creatinine 1.6-1.8 in careeverywhere  since 2022. Recent Labs    06/20/22 0940 06/21/22 0619 06/22/22 0238  BUN 30* 28* 32*  CREATININE 1.66* 1.84* 1.78*   Chronic right leg wound, consulted wound care.  Appreciate input. Neck Pain: add muscle relaxant continue Tylenol x-ray showed DJD DVT prophylaxis: coumadin Code Status:   Code Status: DNR Family Communication: plan of care discussed with patient at bedside. Updated his friend- he has no family  Patient status is: Inpatient because of congestive failure management Level of care: Telemetry Medical   Dispo: The patient is  from: Home lives alone            Anticipated disposition: SNF 1-2 day. He seems reluctant for snf as per his friend. Will be high risk for readmission  Objective: Vitals last 24 hrs: Vitals:   06/21/22 2007 06/22/22  0040 06/22/22 0242 06/22/22 0901  BP: 104/66 (!) 89/53 91/70 (!) 103/57  Pulse: 72 (!) 59 74 67  Resp: 18 18 18 15   Temp: 98.2 F (36.8 C) (!) 97.1 F (36.2 C) (!) 97.5 F (36.4 C) (!) 97.5 F (36.4 C)  TempSrc: Oral Axillary Oral Oral  SpO2: 97% 97% 98% 100%  Weight:       Weight change:   Physical Examination: General exam: AA, elderly, weak,older appearing HEENT:Oral mucosa moist, Ear/Nose WNL grossly, dentition normal. Respiratory system: bilaterally diminished BS, no use of accessory muscle Cardiovascular system: S1 & S2 +, regular rate. Gastrointestinal system: Abdomen soft, obese, NT,ND,BS+ Nervous System:Alert, awake, moving extremities and grossly nonfocal Extremities: Wound on the right lower extremity, chronic appearing lower leg edema.   Skin: No rashes,no icterus. MSK: Normal muscle bulk,tone, power   Medications reviewed:  Scheduled Meds:  furosemide  40 mg Intravenous BID   lidocaine  1 patch Transdermal Q24H   metoprolol tartrate  50 mg Oral BID   mupirocin ointment   Topical BID   pravastatin  20 mg Oral q1800   sodium chloride flush  3 mL Intravenous Q12H   Warfarin - Pharmacist Dosing Inpatient   Does not apply q1600  Continuous Infusions:  sodium chloride      Diet Order             Diet Heart Room service appropriate? Yes; Fluid consistency: Thin; Fluid restriction: 2000 mL Fluid  Diet effective now                  Intake/Output Summary (Last 24 hours) at 06/22/2022 1315 Last data filed at 06/22/2022 0900 Gross per 24 hour  Intake --  Output 1850 ml  Net -1850 ml    Net IO Since Admission: -3,850 mL [06/22/22 1315]  Wt Readings from Last 3 Encounters:  06/20/22 99.8 kg  12/19/21 97 kg  12/18/21 97.5 kg     Unresulted Labs (From admission, onward)     Start     Ordered   06/21/22 0500  Protime-INR  Daily at 5am,   R      06/20/22 1610          Data Reviewed: I have personally reviewed following labs and imaging  studies CBC: Recent Labs  Lab 06/20/22 0940 06/21/22 0619 06/22/22 0238  WBC 10.0 11.2* 10.5  NEUTROABS 7.8*  --   --   HGB 12.5* 12.7* 11.8*  HCT 37.2* 39.4 35.0*  MCV 103.6* 103.7* 100.3*  PLT 266 287 254    Basic Metabolic Panel: Recent Labs  Lab 06/20/22 0940 06/21/22 0619 06/22/22 0238 06/22/22 0531  NA 142 141 141  --   K 4.1 3.7 3.2*  --   CL 109 102 100  --   CO2 22 26 28   --   GLUCOSE 95 105* 119*  --   BUN 30* 28* 32*  --   CREATININE 1.66* 1.84* 1.78*  --   CALCIUM 8.7* 8.9 8.6*  --   MG  --  2.2  --  2.0  PHOS  --  3.1  --   --     GFR: CrCl cannot be calculated (Unknown ideal weight.). Liver  Function Tests: Recent Labs  Lab 06/20/22 0940  AST 27  ALT 17  ALKPHOS 30*  BILITOT 0.9  PROT 6.0*  ALBUMIN 3.5    Culture/Microbiology No results found for: "SDES", "SPECREQUEST", "CULT", "REPTSTATUS"  Other culture-see note  Radiology Studies: DG Cervical Spine 2 or 3 views  Result Date: 06/22/2022 CLINICAL DATA:  Neck pain EXAM: CERVICAL SPINE - 2-3 VIEW COMPARISON:  None Available. FINDINGS: There is no radiographic evidence of cervical spine fracture. Straightening of the cervical lordosis with slight reversal centered at C3-C4. There is mild to moderate multilevel degenerative disc disease and moderate-severe multilevel facet arthropathy. IMPRESSION: No acute osseous abnormality. Multilevel degenerative disc disease and facet arthropathy. Electronically Signed   By: Maurine Simmering M.D.   On: 06/22/2022 12:13   ECHOCARDIOGRAM COMPLETE  Result Date: 06/21/2022    ECHOCARDIOGRAM REPORT   Patient Name:   Hector Vazquez Date of Exam: 06/21/2022 Medical Rec #:  [id number]   Height:       75.0 in Accession #:    [id number]  Weight:       220.0 lb Date of Birth:  [birth year]    BSA:          2.285 m Patient Age:    43 years    BP:           116/88 mmHg Patient Gender: M           HR:           75 bpm. Exam Location:  Inpatient Procedure: 2D Echo and Intracardiac  Opacification Agent Indications:    CHF  History:        Patient has no prior history of Echocardiogram examinations.  Sonographer:    Harvie Junior Referring Phys: [id number] PING T [place]  Sonographer Comments: Technically difficult study due to poor echo windows and no subcostal window. IMPRESSIONS  1. EF challenging in the setting of atrial fibrillation. SVi is low 21 cc/m2. Left ventricular ejection fraction, by estimation, is 40 to 45%. The left ventricle has mildly decreased function. The left ventricle demonstrates global hypokinesis. Left ventricular diastolic parameters are indeterminate.  2. Right ventricular systolic function is normal. The right ventricular size is mildly enlarged. There is normal pulmonary artery systolic pressure.  3. Left atrial size was moderately dilated.  4. The mitral valve was not well visualized. Mild mitral valve regurgitation.  5. The aortic valve is tricuspid. Aortic valve regurgitation is mild.  6. Aneurysm of the aortic root, measuring 40 mm.  7. The inferior vena cava is normal in size with greater than 50% respiratory variability, suggesting right atrial pressure of 3 mmHg. FINDINGS  Left Ventricle: EF challenging in the setting of atrial fibrillation. SVi is low 21 cc/m2. Left ventricular ejection fraction, by estimation, is 40 to 45%. The left ventricle has mildly decreased function. The left ventricle demonstrates global hypokinesis. The left ventricular internal cavity size was normal in size. There is no left ventricular hypertrophy. Left ventricular diastolic parameters are indeterminate. Right Ventricle: The right ventricular size is mildly enlarged. Right ventricular systolic function is normal. There is normal pulmonary artery systolic pressure. The tricuspid regurgitant velocity is 2.69 m/s, and with an assumed right atrial pressure of 3 mmHg, the estimated right ventricular systolic pressure is 123XX123 mmHg. Left Atrium: Left atrial size was moderately dilated. Right  Atrium: Right atrial size was normal in size. Pericardium: There is no evidence of pericardial effusion. Mitral Valve: The mitral valve was not well visualized. Mild mitral  valve regurgitation. Tricuspid Valve: Tricuspid valve regurgitation is mild. Aortic Valve: The aortic valve is tricuspid. Aortic valve regurgitation is mild. Aortic regurgitation PHT measures 614 msec. Aortic valve mean gradient measures 3.0 mmHg. Aortic valve peak gradient measures 4.8 mmHg. Aortic valve area, by VTI measures 2.37 cm. Pulmonic Valve: Pulmonic valve regurgitation is not visualized. Aorta: There is an aneurysm involving the aortic root measuring 40 mm. Venous: The inferior vena cava is normal in size with greater than 50% respiratory variability, suggesting right atrial pressure of 3 mmHg. IAS/Shunts: No atrial level shunt detected by color flow Doppler.  LEFT VENTRICLE PLAX 2D LVIDd:         5.00 cm     Diastology LVIDs:         4.00 cm     LV e' medial:    10.20 cm/s LV PW:         1.00 cm     LV E/e' medial:  8.0 LV IVS:        1.00 cm     LV e' lateral:   11.10 cm/s LVOT diam:     2.20 cm     LV E/e' lateral: 7.4 LV SV:         48 LV SV Index:   21 LVOT Area:     3.80 cm  LV Volumes (MOD) LV vol d, MOD A2C: 86.9 ml LV vol d, MOD A4C: 98.6 ml LV vol s, MOD A2C: 54.2 ml LV vol s, MOD A4C: 58.4 ml LV SV MOD A2C:     32.7 ml LV SV MOD A4C:     98.6 ml LV SV MOD BP:      34.8 ml RIGHT VENTRICLE RV Basal diam:  4.40 cm RV Mid diam:    4.40 cm RV S prime:     13.30 cm/s TAPSE (M-mode): 2.1 cm LEFT ATRIUM              Index        RIGHT ATRIUM           Index LA diam:        4.60 cm  2.01 cm/m   RA Area:     20.90 cm LA Vol (A2C):   110.0 ml 48.14 ml/m  RA Volume:   53.60 ml  23.46 ml/m LA Vol (A4C):   75.3 ml  32.95 ml/m LA Biplane Vol: 94.2 ml  41.22 ml/m  AORTIC VALVE                    PULMONIC VALVE AV Area (Vmax):    2.72 cm     PV Vmax:       0.95 m/s AV Area (Vmean):   2.65 cm     PV Peak grad:  3.6 mmHg AV Area  (VTI):     2.37 cm AV Vmax:           109.00 cm/s AV Vmean:          74.900 cm/s AV VTI:            0.202 m AV Peak Grad:      4.8 mmHg AV Mean Grad:      3.0 mmHg LVOT Vmax:         78.00 cm/s LVOT Vmean:        52.200 cm/s LVOT VTI:          0.126 m LVOT/AV VTI ratio: 0.62 AI PHT:  614 msec  AORTA Ao Root diam: 4.00 cm Ao Asc diam:  3.60 cm MITRAL VALVE               TRICUSPID VALVE MV Area (PHT): 4.60 cm    TR Peak grad:   28.9 mmHg MV Decel Time: 165 msec    TR Vmax:        269.00 cm/s MR Peak grad: 81.4 mmHg MR Vmax:      451.00 cm/s  SHUNTS MV E velocity: 82.10 cm/s  Systemic VTI:  0.13 m MV A velocity: 59.80 cm/s  Systemic Diam: 2.20 cm MV E/A ratio:  1.37 Phineas Inches Electronically signed by Phineas Inches Signature Date/Time: 06/21/2022/2:08:13 PM    Final      LOS: 2 days   Antonieta Pert, MD [facility]  06/22/2022, 1:15 PM

## 2022-06-22 NOTE — Progress Notes (Signed)
ANTICOAGULATION CONSULT NOTE   Pharmacy Consult for Warfarin Indication: atrial fibrillation  No Known Allergies  Patient Measurements: Weight: 99.8 kg (220 lb)  Vital Signs: Temp: 97.5 F (36.4 C) (12/01 0901) Temp Source: Oral (12/01 0901) BP: 103/57 (12/01 0901) Pulse Rate: 67 (12/01 0901)  Labs: Recent Labs    06/20/22 0940 06/20/22 1139 06/21/22 0619 06/22/22 0238  HGB 12.5*  --  12.7* 11.8*  HCT 37.2*  --  39.4 35.0*  PLT 266  --  287 254  LABPROT 29.5*  --  29.0* 32.8*  INR 2.8*  --  2.8* 3.2*  CREATININE 1.66*  --  1.84* 1.78*  TROPONINIHS 27* 28*  --   --      CrCl cannot be calculated (Unknown ideal weight.).   Medical History: Past Medical History:  Diagnosis Date   Coronary artery disease     Medications:  Medications Prior to Admission  Medication Sig Dispense Refill Last Dose   b complex vitamins capsule Take 1 capsule by mouth daily.   06/19/2022   Cholecalciferol 25 MCG (1000 UT) capsule Take 1,000 Units by mouth daily.   06/19/2022   diltiazem (CARDIZEM CD) 240 MG 24 hr capsule Take 240 mg by mouth daily.   06/19/2022   lovastatin (MEVACOR) 20 MG tablet Take 20 mg by mouth daily.   06/19/2022   metoprolol succinate (TOPROL-XL) 25 MG 24 hr tablet Take 25 mg by mouth daily.   06/19/2022 at 0630   warfarin (COUMADIN) 2.5 MG tablet Take 1.25-2.5 mg by mouth See admin instructions. Pt takes 2.5mg  every day of the week except for Thursdays, patient takes 1.25mg .   06/19/2022 at 0630   diclofenac Sodium (VOLTAREN) 1 % GEL Apply 4 g topically 4 (four) times daily. (Patient not taking: Reported on 06/20/2022) 100 g 0 Not Taking    Assessment: 88 yo M presented to ED as a level 2 trauma s/p a mechanical fall, followed by a second fall 2/2 to dizziness and weakness on 06/20/22. Abrasions noted to posterior head and bilateral elbows and no active bleeding noted on presentation to ED. Pt has h/o Afib and is on warfarin PTA. -INR with trend up possibly in  the setting of HF  Home warfarin regimen is 2.5mg  po daily except for 1.25mg  on Thursdays, last dose 11/28 @ 06:30.    Goal of Therapy:  INR 2-3 Monitor platelets by anticoagulation protocol: Yes   Plan:  -Hold warfarin today -Daily INR  Harland German, PharmD Clinical Pharmacist **Pharmacist phone directory can now be found on [email or web address] ([email or web address]).  Listed under [facility].

## 2022-06-22 NOTE — TOC Progression Note (Signed)
Transition of Care [facility]) - Progression Note    Patient Details  Name: Hector Vazquez MRN: [id number] Date of Birth: March 22, 1930  Transition of [facility]) CM/SW Contact  Graves-Bigelow, Lamar Laundry, RN Phone Number: 06/22/2022, 4:16 PM  Clinical Narrative:  Case Manager spoke with patient regarding disposition needs. CSW relayed to Case Manager that the patient has declined SNF. Patient is from home alone and he reports that his neighbors assist as needed. Patient is unsure if his neighbors can stay with him post hospitalization. Patient states he was driving to appointments prior to admission. Patient states he uses DME: rolling walker, cane, and wheelchair in the home. Case Manager discussed with patient that he  only ambulating 20 ft per PT documentation and the recommendation is for SNF. Patient declines SNF and we discussed a Home Health Agency for PT/OT- patient declines services at this time. Case Manager relayed to patient that he has to have a safe discharge plan once stable. Patient will need to work with therapy more before transition home. Case Manager made MD aware of the above information. Case Manager will continue to follow the patient.   Expected Discharge Plan:  (Home interested in hh services) Barriers to Discharge: Continued Medical Work up  Expected Discharge Plan and Services Expected Discharge Plan:  (Home interested in hh services) In-house Referral: Clinical Social Work     Living arrangements for the past 2 months: Single Family Home    Readmission Risk Interventions     No data to display

## 2022-06-22 NOTE — TOC Initial Note (Signed)
Transition of Care [facility]) - Initial/Assessment Note    Patient Details  Name: Hector Vazquez MRN: [id number] Date of Birth: [birth year]  [facility]) CM/SW Contact:    Delilah Shan, LCSWA Phone Number: 06/22/2022, 2:07 PM  Clinical Narrative:                  CSW received consult for possible SNF placement at time of discharge. CSW spoke with patient regarding PT recommendation of SNF placement at time of discharge. CSW informed CSW he comes from home alone. Patient expressed understanding of PT recommendation and declined SNF placement at time of discharge. Patient informed CSW he would like to return home when medically ready for dc.Patient reports he has support by his neighbors. Patient interested in speaking with CM about HH services/home needs.CSW informed CM.No further questions reported at this time. CSW to continue to follow and assist with discharge planning needs.   Expected Discharge Plan:  (Home interested in hh services) Barriers to Discharge: Continued Medical Work up   Patient Goals and CMS Choice Patient states their goals for this hospitalization and ongoing recovery are:: wants to return home interested in speaking with case manager about hh services CMS Medicare.gov Compare Post Acute Care list provided to:: Patient Choice offered to / list presented to : Patient  Expected Discharge Plan and Services Expected Discharge Plan:  (Home interested in hh services) In-house Referral: Clinical Social Work     Living arrangements for the past 2 months: Single Family Home                                      Prior Living Arrangements/Services Living arrangements for the past 2 months: Single Family Home Lives with:: Self Patient language and need for interpreter reviewed:: Yes Do you feel safe going back to the place where you live?: Yes      Need for Family Participation in Patient Care: Yes (Comment) Care giver support system in place?: Yes  (comment)   Criminal Activity/Legal Involvement Pertinent to Current Situation/Hospitalization: No - Comment as needed  Activities of Daily Living      Permission Sought/Granted Permission sought to share information with : Case Manager, Family Supports, Magazine features editor                Emotional Assessment Appearance:: Appears stated age Attitude/Demeanor/Rapport: Gracious Affect (typically observed): Calm Orientation: : Oriented to Self, Oriented to Place, Oriented to  Time, Oriented to Situation Alcohol / Substance Use: Not Applicable Psych Involvement: No (comment)  Admission diagnosis:  CHF (congestive heart failure) (HCC) [I50.9] Trauma [T14.90XA] Patient Active Problem List   Diagnosis Date Noted   Acute on chronic systolic CHF (congestive heart failure) (HCC) 06/20/2022   Impaired ambulation 06/20/2022   CHF (congestive heart failure) (HCC) 06/20/2022   PCP:  Lenox Ponds, MD Pharmacy:   [facility] [phone] - [place], [place] - [address] [place] [address] Phone: [phone] Fax: [phone]     Social Determinants of Health (SDOH) Interventions    Readmission Risk Interventions     No data to display

## 2022-06-22 NOTE — Evaluation (Signed)
Occupational Therapy Evaluation Patient Details Name: Hector Vazquez MRN: [id number] DOB: Nov 26, 1929 Today's Date: 06/22/2022   History of Present Illness Hector Vazquez is a 88 y.o. male with medical history significant of CAD, PAF on Coumadin, HLD, CKD stage IIIb, peripheral neuropathy, peripheral edema secondary to venous insufficiency, came with frequent falls.   Clinical Impression   PTA, pt reports that he was mod I in ADL and was driving. Pt presents with decreased mobility due to bil foot pain, decreased balance, activity tolerance and recent falls. Pt performing transfers with min A-Min guard A. Pt requiring up to mod A to perform LB ADL and set-up for IB ADL. Pt able to recall events from hospital admission and able to verbalize use of call bell to receive assistance before attempting to get up. Will continue to assess. Due to need for external assist at this time, recommending [facility] for continued OT services.    Recommendations for follow up therapy are one component of a multi-disciplinary discharge planning process, led by the attending physician.  Recommendations may be updated based on patient status, additional functional criteria and insurance authorization.   Follow Up Recommendations  Skilled nursing-short term rehab (<3 hours/day)     Assistance Recommended at Discharge Intermittent Supervision/Assistance  Patient can return home with the following A little help with walking and/or transfers;A little help with bathing/dressing/bathroom;Assistance with cooking/housework;Direct supervision/assist for medications management;Direct supervision/assist for financial management;Assist for transportation;Help with stairs or ramp for entrance    Functional Status Assessment  Patient has had a recent decline in their functional status and demonstrates the ability to make significant improvements in function in a reasonable and predictable amount of time.  Equipment Recommendations  Other  (comment) (defer to next venue)    Recommendations for Other Services       Precautions / Restrictions Precautions Precautions: Fall Precaution Comments: 2 recent per pt Required Braces or Orthoses: Other Brace Other Brace: wears heel cups in his shoes due to heel pain; R foot with ulcer under first met head Restrictions Weight Bearing Restrictions: No      Mobility Bed Mobility Overal bed mobility: Needs Assistance Bed Mobility: Supine to Sit     Supine to sit: Min guard, HOB elevated     General bed mobility comments: Max attempts to educate regarding log roll, but pt coming to EOB slightly impulsively    Transfers Overall transfer level: Needs assistance Equipment used: Rolling walker (2 wheels) Transfers: Sit to/from Stand Sit to Stand: Min assist, Min guard           General transfer comment: Min lifting help for forst transfer. Min guard A for up from arm chair      Balance Overall balance assessment: Needs assistance   Sitting balance-Leahy Scale: Fair Sitting balance - Comments: Initially leaning posteriorly, but after cues to achieve feet on floor, supervision EOB   Standing balance support: Bilateral upper extremity supported Standing balance-Leahy Scale: Poor Standing balance comment: Initial posterior bias with mod A for balance, improved to S with UE support on RW                           ADL either performed or assessed with clinical judgement   ADL Overall ADL's : Needs assistance/impaired Eating/Feeding: Modified independent;Sitting Eating/Feeding Details (indicate cue type and reason): with feet supported Grooming: Oral care;Sitting;Set up Grooming Details (indicate cue type and reason): Bil foot pain in standing and requesting to sit. Based on  set-up, anticipate pt typically performs in seated position at home Upper Body Bathing: Set up;Sitting   Lower Body Bathing: Moderate assistance;Sit to/from stand Lower Body Bathing  Details (indicate cue type and reason): Mod A due to inability to reach feet. P Upper Body Dressing : Set up;Sitting   Lower Body Dressing: Moderate assistance;Sit to/from stand Lower Body Dressing Details (indicate cue type and reason): Mod A to don shoes. Pt reporting he uses AE at baseline and sits in specific chair at home that is adjustable to be able to tie shoes. Toilet Transfer: Minimal assistance;Min guard;Ambulation;BSC/3in1;Rolling walker (2 wheels) Toilet Transfer Details (indicate cue type and reason): Min A initially from EOB, but min guard A from arm chair         Functional mobility during ADLs: Min guard;Rolling walker (2 wheels);Minimal assistance General ADL Comments: Initially min A, but min guard A after donning shoes,     Vision Baseline Vision/History: 1 Wears glasses Ability to See in Adequate Light: 0 Adequate Patient Visual Report: No change from baseline Vision Assessment?: No apparent visual deficits Additional Comments: WFL for tasks assessed     Perception     Praxis      Pertinent Vitals/Pain Pain Assessment Pain Assessment: Faces Faces Pain Scale: Hurts even more Pain Location: R>L foot with weight bearing Pain Descriptors / Indicators: Discomfort, Guarding, Sore Pain Intervention(s): Limited activity within patient's tolerance, Monitored during session     Hand Dominance Right   Extremity/Trunk Assessment Upper Extremity Assessment Upper Extremity Assessment: Generalized weakness   Lower Extremity Assessment Lower Extremity Assessment: Generalized weakness   Cervical / Trunk Assessment Cervical / Trunk Assessment: Kyphotic   Communication Communication Communication: HOH   Cognition Arousal/Alertness: Awake/alert Behavior During Therapy: WFL for tasks assessed/performed Overall Cognitive Status: No family/caregiver present to determine baseline cognitive functioning                                 General Comments:  oriented and explaining falls at home well, but hyperverbal almost as if trying to cover for deficits. Decreased understanding of medical treatment. Hard of hearing.     General Comments  HR max of 108    Exercises     Shoulder Instructions      Home Living Family/patient expects to be discharged to:: Private residence Living Arrangements: Alone Available Help at Discharge: Neighbor (Pt reports several helpful neighbors nearby) Type of Home: House Home Access: Stairs to enter [employer] of Steps: 1   Home Layout: One level     Bathroom Shower/Tub: Walk-in shower         Home Equipment: Conservation officer, nature (2 wheels);Cane - single point          Prior Functioning/Environment Prior Level of Function : History of Falls (last six months);Driving;Needs assist             Mobility Comments: reports that he uses his walker in the community and a cane or walker at home. Pt reports he fell attempting to retrieve shirt in hall and apparently keeps chothing in hallway. ADLs Comments: Pt reports independent in ADL but has housekeeper. Drives        OT Problem List: Decreased strength;Decreased activity tolerance;Impaired balance (sitting and/or standing);Decreased cognition;Decreased safety awareness;Decreased knowledge of use of DME or AE;Decreased knowledge of precautions;Pain      OT Treatment/Interventions: Self-care/ADL training;Therapeutic exercise;DME and/or AE instruction;Therapeutic activities;Patient/family education;Balance training;Cognitive remediation/compensation    OT Goals(Current  goals can be found in the care plan section) Acute Rehab OT Goals Patient Stated Goal: go home OT Goal Formulation: With patient Time For Goal Achievement: 07/06/22 Potential to Achieve Goals: Good  OT Frequency: Min 2X/week    Co-evaluation              AM-PAC OT "6 Clicks" Daily Activity     Outcome Measure Help from another person eating meals?: None Help  from another person taking care of personal grooming?: A Little Help from another person toileting, which includes using toliet, bedpan, or urinal?: A Little Help from another person bathing (including washing, rinsing, drying)?: A Little Help from another person to put on and taking off regular upper body clothing?: A Little Help from another person to put on and taking off regular lower body clothing?: A Lot 6 Click Score: 18   End of Session Equipment Utilized During Treatment: Gait belt;Rolling walker (2 wheels) Nurse Communication: Mobility status;Other (comment) (needs chair alarm box)  Activity Tolerance: Patient tolerated treatment well Patient left: in chair;with call bell/phone within reach;Other (comment) (Chair alarm pad under pt but unable to locate alarm box, RN notified, door open)  OT Visit Diagnosis: Unsteadiness on feet (R26.81);Muscle weakness (generalized) (M62.81);Other abnormalities of gait and mobility (R26.89);Other symptoms and signs involving cognitive function;Pain Pain - part of body:  (feet)                Time: 1150-1240 OT Time Calculation (min): 50 min Charges:  OT General Charges $OT Visit: 1 Visit OT Evaluation $OT Eval Moderate Complexity: 1 Mod OT Treatments $Self Care/Home Management : 23-37 mins  Elder Cyphers, OTR/L [facility] Acute Rehabilitation Office: [phone]    Magnus Ivan 06/22/2022, 1:19 PM

## 2022-06-22 NOTE — Progress Notes (Signed)
Heart Failure Nurse Navigator Progress Note   Patient to be interviewed for HF TOC appointment. EF 40-45%. Patient stated he wished to stay with Dr. Sampson Goon, thru [facility] at this time.   Navigator will sign off at this time.   Rhae Hammock, BSN, Scientist, clinical (histocompatibility and immunogenetics) Only

## 2022-06-23 DIAGNOSIS — I5023 Acute on chronic systolic (congestive) heart failure: Secondary | ICD-10-CM | POA: Diagnosis not present

## 2022-06-23 LAB — BASIC METABOLIC PANEL
Anion gap: 8 (ref 5–15)
BUN: 27 mg/dL — ABNORMAL HIGH (ref 8–23)
CO2: 25 mmol/L (ref 22–32)
Calcium: 8.4 mg/dL — ABNORMAL LOW (ref 8.9–10.3)
Chloride: 104 mmol/L (ref 98–111)
Creatinine, Ser: 1.47 mg/dL — ABNORMAL HIGH (ref 0.61–1.24)
GFR, Estimated: 45 mL/min — ABNORMAL LOW (ref >60)
Glucose, Bld: 109 mg/dL — ABNORMAL HIGH (ref 70–99)
Potassium: 3.6 mmol/L (ref 3.5–5.1)
Sodium: 137 mmol/L (ref 135–145)

## 2022-06-23 LAB — PROTIME-INR
INR: 3.2 — ABNORMAL HIGH (ref 0.8–1.2)
Prothrombin Time: 32.1 seconds — ABNORMAL HIGH (ref 11.4–15.2)

## 2022-06-23 LAB — CBC
HCT: 38.2 % — ABNORMAL LOW (ref 39.0–52.0)
Hemoglobin: 12.4 g/dL — ABNORMAL LOW (ref 13.0–17.0)
MCH: 33.1 pg (ref 26.0–34.0)
MCHC: 32.5 g/dL (ref 30.0–36.0)
MCV: 101.9 fL — ABNORMAL HIGH (ref 80.0–100.0)
Platelets: 255 10*3/uL (ref 150–400)
RBC: 3.75 MIL/uL — ABNORMAL LOW (ref 4.22–5.81)
RDW: 14.2 % (ref 11.5–15.5)
WBC: 9.3 10*3/uL (ref 4.0–10.5)
nRBC: 0 % (ref 0.0–0.2)

## 2022-06-23 LAB — VITAMIN B12: Vitamin B-12: 319 pg/mL (ref 180–914)

## 2022-06-23 LAB — TSH: TSH: 1 u[IU]/mL (ref 0.350–4.500)

## 2022-06-23 MED ORDER — WARFARIN 0.5 MG HALF TABLET
0.5000 mg | ORAL_TABLET | Freq: Once | ORAL | Status: AC
  Administered 2022-06-23: 0.5 mg via ORAL
  Filled 2022-06-23: qty 1

## 2022-06-23 MED ORDER — MIDODRINE HCL 5 MG PO TABS
5.0000 mg | ORAL_TABLET | Freq: Three times a day (TID) | ORAL | Status: DC
  Administered 2022-06-23 – 2022-07-02 (×27): 5 mg via ORAL
  Filled 2022-06-23 (×28): qty 1

## 2022-06-23 MED ORDER — FUROSEMIDE 20 MG PO TABS
20.0000 mg | ORAL_TABLET | Freq: Every day | ORAL | Status: DC
  Administered 2022-06-24 – 2022-06-27 (×4): 20 mg via ORAL
  Filled 2022-06-23 (×4): qty 1

## 2022-06-23 NOTE — Progress Notes (Signed)
Mobility Specialist - Progress Note   06/23/22 1453  Mobility  Activity Stood at bedside  Level of Assistance Moderate assist, patient does 50-74%  Assistive Device Front wheel walker  Activity Response Tolerated fair  Mobility Referral Yes  $Mobility charge 1 Mobility   Pt was received in bed and agreeable to orthotics. No complaints throughout. Pt was returned to bed with all needs met and RN notified.  Orthostatic BPs BPs  HR  Supine 89/53(62)   Sitting 103/55(69)   Standing 81/47(59)   Standing after 3 min 88/65(72)     [name]  Mobility Specialist Please contact via SecureChat or Rehab office at [phone]

## 2022-06-23 NOTE — Progress Notes (Signed)
ANTICOAGULATION CONSULT NOTE - Follow Up Consult  Pharmacy Consult for Warfarin Indication: atrial fibrillation  No Known Allergies  Patient Measurements: Height: 6\' 3"  (190.5 cm) Weight: 99.8 kg (220 lb) IBW/kg (Calculated) : 84.5  Vital Signs: Temp: 98.1 F (36.7 C) (12/02 0535) Temp Source: Oral (12/02 0535) BP: 105/58 (12/02 0954) Pulse Rate: 74 (12/02 1030)  Labs: Recent Labs    06/21/22 0619 06/22/22 0238 06/23/22 0131  HGB 12.7* 11.8* 12.4*  HCT 39.4 35.0* 38.2*  PLT 287 254 255  LABPROT 29.0* 32.8* 32.1*  INR 2.8* 3.2* 3.2*  CREATININE 1.84* 1.78* 1.47*    Estimated Creatinine Clearance: 39.9 mL/min (A) (by C-G formula based on SCr of 1.47 mg/dL (H)).  Assessment: 88 yo M presented to ED as a level 2 trauma s/p a mechanical fall, followed by a second fall secondarty to dizziness and weakness on 06/20/22. Abrasions noted to posterior head and bilateral elbows and no active bleeding noted on presentation to ED. Pt has hx atrial fibrillation and on Warfarin PTA, which has been continued per Pharmacy dosing.  INR therapeutic (2.8) on admit and trended up to 3.2 on 12/1 after usual doses.  Warfarin held 12/1 and INR 3.2 again today.  Possibly up in setting of heart failure.  PTA regimen: 2.5 mg daily except 1.25 mg on Thursdays.  Goal of Therapy:  INR 2-3 Monitor platelets by anticoagulation protocol: Yes   Plan:  Warfarin 0.5 mg x 1 today. Small dose to try to avoid significant INR drop. Daily PT/INR  12-23-1979, RPh 06/23/2022,12:54 PM

## 2022-06-23 NOTE — Progress Notes (Signed)
PROGRESS NOTE Hector Vazquez  [id number] DOB: October 26, 1929 DOA: 06/20/2022 PCP: Doreatha Lew, MD   Brief Narrative/Hospital Course:  88 y.o.m w/ history of CAD, PAF on Coumadin, HLD, CKD stage IIIb, peripheral neuropathy, peripheral edema secondary to venous insufficiency, chronic ambulatory dysfunction at baseline using cane to ambulate presented with multiple fall He has had multiple falls this year with multiple ED visits patient started to feel "my legs are so heavy" night before and next morning 06/20/22 became lightheaded standing up and fell down on his back and hit his neck and head without loss of consciousness.  Denies any preceding palpitation shortness of breath.  Claimed that his liquid more swollen in 2 months has chronic right foot ulcer care at [facility] wound care.  Lives by himself with no children and wife ED Course: Blood pressure borderline low, no tachycardia afebrile.  CT head and neck no acute findings.  WBC 10.0, K4.1, creatinine 1.6 compared to baseline 1.7, bicarb 22. Patient was found to have fluid overload, and 40 mg Lasix given in the ED Patient was admitted for near syncope and fall, orthostatic vitals ordered, also admitted with right-sided CHF significant peripheral edema given Lasix, echo ordered renal function repeated    Subjective: Seen and examined this morning.  His friend at the bedside Has been having neck pain attributed to the neck collar he was placed in the ambulance Agreeable for skilled nursing facility Overnight afebrile, currently on room air  Assessment and Plan: Principal Problem:   CHF (congestive heart failure) ([place]) Active Problems:   Acute on chronic systolic CHF (congestive heart failure) (HCC)   Impaired ambulation  Near syncope and fall Multiple falls at home: Likely multifactorial in the setting of patient's congestive heart failure, leg edema, wound and deconditioning.Continue PT OT and planning for skilled nursing facility.  Follow up TSH B12. vitamin D normal in October.    Acute on chronic CHF with right-sided heart failure Acute on chronic systolic CHF EF at [id number] now Edema: BNP elevated 741 with worsening peripheral edema, chest x-ray low volume chest with mild atelectasis.Echo shows EF 40-45%, global hypokinesia, normal RV systolic function.  Echo from 2021 with EF 50-50%. Cont on lasix 40 mg daily, overall improving PRN negative balance as below. Cont metoprolol, monitor intake output Daily weight and renal function, hopefully can transition to oral Lasix tomorrow.He is followed by Dr. Ola Spurr at [facility]. Net IO Since Admission: -4,845 mL [06/23/22 1155]  Filed Weights   06/20/22 1008  Weight: 99.8 kg   Acute hypoxic respiratory failure saturation 83% overnight placed on 2 L nasal cannula. Doing well on room air.  Troponin 27-28 flat likely demand ischemia from CHF  Hypokalemia: Repleted.  Hypertension: Stable on metoprolol.  PAF on Coumadin.  Metoprolol increased Cardizem discontinued. Pharmacy dosing Coumadin INR therapeutic as below, continue monitoring. Recent Labs  Lab 06/20/22 0940 06/21/22 0619 06/22/22 0238 06/23/22 0131  INR 2.8* 2.8* 3.2* 3.2*   Elevated intact PTH 110, a month ago  CKD stage IIIb: Baseline creatinine 1.6-1.8 in careeverywhere  since 2022 improved to 1.4 tolerating Lasix.Marland Kitchen Recent Labs    06/20/22 0940 06/21/22 0619 06/22/22 0238 06/23/22 0131  BUN 30* 28* 32* 27*  CREATININE 1.66* 1.84* 1.78* 1.47*   Chronic right leg wound, consulted wound care.  Appreciate input. Neck Pain likely musculoskeletal, attributed to neck collar in ambulance per patient.  X-ray shows multilevel degenerative disc disease and facet arthropathy.  Continue Robaxin Norco, lidocaine patch.  DVT prophylaxis: coumadin  Code Status:   Code Status: DNR Family Communication: plan of care discussed with patient at bedside. Updated his friend- he has no family  Patient status is:  Inpatient because of congestive failure management Level of care: Telemetry Medical   Dispo: The patient is from: Home lives alone            Anticipated disposition: SNF 1-2 days -he is interested to go to [facility] only   Objective: Vitals last 24 hrs: Vitals:   06/22/22 2033 06/23/22 0535 06/23/22 0954 06/23/22 1030  BP: 101/67 118/63 (!) 105/58   Pulse: 67 65 (!) 122 74  Resp: 16 17    Temp: 98 F (36.7 C) 98.1 F (36.7 C)    TempSrc: Oral Oral    SpO2: 100% 100%  97%  Weight:       Weight change:   Physical Examination: General exam: AAox3, elderly, frail weak,older appearing HEENT:Oral mucosa moist, Ear/Nose WNL grossly, dentition normal. Respiratory system: bilaterally clear BS, no use of accessory muscle Cardiovascular system: S1 & S2 +, regular rate, JVD neg. Gastrointestinal system: Abdomen soft, NT,ND,BS+ Nervous System:Alert, awake, moving extremities and grossly nonfocal on upper extremities sensation intact Extremities: LE ankle edema mild-skin wrinkling with chronic swelling ,lower extremities warm Skin: No rashes,no icterus. MSK: Normal muscle bulk,tone, power   Medications reviewed:  Scheduled Meds:  furosemide  40 mg Intravenous Daily   lidocaine  1 patch Transdermal Q24H   metoprolol tartrate  50 mg Oral BID   mupirocin ointment   Topical BID   potassium chloride  20 mEq Oral Daily   pravastatin  20 mg Oral q1800   sodium chloride flush  3 mL Intravenous Q12H   Warfarin - Pharmacist Dosing Inpatient   Does not apply q1600  Continuous Infusions:  sodium chloride     methocarbamol (ROBAXIN) IV 500 mg (06/22/22 1813)    Diet Order             Diet Heart Room service appropriate? Yes; Fluid consistency: Thin; Fluid restriction: 2000 mL Fluid  Diet effective now                  Intake/Output Summary (Last 24 hours) at 06/23/2022 1155 Last data filed at 06/23/2022 0500 Gross per 24 hour  Intake 55 ml  Output 1050 ml  Net -995 ml    Net IO  Since Admission: -4,845 mL [06/23/22 1155]  Wt Readings from Last 3 Encounters:  06/20/22 99.8 kg  12/19/21 97 kg  12/18/21 97.5 kg     Unresulted Labs (From admission, onward)     Start     Ordered   06/21/22 0500  Protime-INR  Daily at 5am,   R      06/20/22 1610          Data Reviewed: I have personally reviewed following labs and imaging studies CBC: Recent Labs  Lab 06/20/22 0940 06/21/22 0619 06/22/22 0238 06/23/22 0131  WBC 10.0 11.2* 10.5 9.3  NEUTROABS 7.8*  --   --   --   HGB 12.5* 12.7* 11.8* 12.4*  HCT 37.2* 39.4 35.0* 38.2*  MCV 103.6* 103.7* 100.3* 101.9*  PLT 266 287 254 255    Basic Metabolic Panel: Recent Labs  Lab 06/20/22 0940 06/21/22 0619 06/22/22 0238 06/22/22 0531 06/23/22 0131  NA 142 141 141  --  137  K 4.1 3.7 3.2*  --  3.6  CL 109 102 100  --  104  CO2 22 26 28   --  25  GLUCOSE 95 105* 119*  --  109*  BUN 30* 28* 32*  --  27*  CREATININE 1.66* 1.84* 1.78*  --  1.47*  CALCIUM 8.7* 8.9 8.6*  --  8.4*  MG  --  2.2  --  2.0  --   PHOS  --  3.1  --   --   --     GFR: CrCl cannot be calculated (Unknown ideal weight.). Liver Function Tests: Recent Labs  Lab 06/20/22 0940  AST 27  ALT 17  ALKPHOS 30*  BILITOT 0.9  PROT 6.0*  ALBUMIN 3.5    Culture/Microbiology No results found for: "SDES", "SPECREQUEST", "CULT", "REPTSTATUS"  Other culture-see note  Radiology Studies: DG Cervical Spine 2 or 3 views  Result Date: 06/22/2022 CLINICAL DATA:  Neck pain EXAM: CERVICAL SPINE - 2-3 VIEW COMPARISON:  None Available. FINDINGS: There is no radiographic evidence of cervical spine fracture. Straightening of the cervical lordosis with slight reversal centered at C3-C4. There is mild to moderate multilevel degenerative disc disease and moderate-severe multilevel facet arthropathy. IMPRESSION: No acute osseous abnormality. Multilevel degenerative disc disease and facet arthropathy. Electronically Signed   By: Maurine Simmering M.D.   On:  06/22/2022 12:13   ECHOCARDIOGRAM COMPLETE  Result Date: 06/21/2022    ECHOCARDIOGRAM REPORT   Patient Name:   Tashan Sanagustin Date of Exam: 06/21/2022 Medical Rec #:  [id number]   Height:       75.0 in Accession #:    [id number]  Weight:       220.0 lb Date of Birth:  Jun 18, 1930    BSA:          2.285 m Patient Age:    81 years    BP:           116/88 mmHg Patient Gender: M           HR:           75 bpm. Exam Location:  Inpatient Procedure: 2D Echo and Intracardiac Opacification Agent Indications:    CHF  History:        Patient has no prior history of Echocardiogram examinations.  Sonographer:    Harvie Junior Referring Phys: [id number] PING T [place]  Sonographer Comments: Technically difficult study due to poor echo windows and no subcostal window. IMPRESSIONS  1. EF challenging in the setting of atrial fibrillation. SVi is low 21 cc/m2. Left ventricular ejection fraction, by estimation, is 40 to 45%. The left ventricle has mildly decreased function. The left ventricle demonstrates global hypokinesis. Left ventricular diastolic parameters are indeterminate.  2. Right ventricular systolic function is normal. The right ventricular size is mildly enlarged. There is normal pulmonary artery systolic pressure.  3. Left atrial size was moderately dilated.  4. The mitral valve was not well visualized. Mild mitral valve regurgitation.  5. The aortic valve is tricuspid. Aortic valve regurgitation is mild.  6. Aneurysm of the aortic root, measuring 40 mm.  7. The inferior vena cava is normal in size with greater than 50% respiratory variability, suggesting right atrial pressure of 3 mmHg. FINDINGS  Left Ventricle: EF challenging in the setting of atrial fibrillation. SVi is low 21 cc/m2. Left ventricular ejection fraction, by estimation, is 40 to 45%. The left ventricle has mildly decreased function. The left ventricle demonstrates global hypokinesis. The left ventricular internal cavity size was normal in size. There is no left  ventricular hypertrophy. Left ventricular diastolic parameters are indeterminate. Right Ventricle: The right ventricular size is  mildly enlarged. Right ventricular systolic function is normal. There is normal pulmonary artery systolic pressure. The tricuspid regurgitant velocity is 2.69 m/s, and with an assumed right atrial pressure of 3 mmHg, the estimated right ventricular systolic pressure is 123XX123 mmHg. Left Atrium: Left atrial size was moderately dilated. Right Atrium: Right atrial size was normal in size. Pericardium: There is no evidence of pericardial effusion. Mitral Valve: The mitral valve was not well visualized. Mild mitral valve regurgitation. Tricuspid Valve: Tricuspid valve regurgitation is mild. Aortic Valve: The aortic valve is tricuspid. Aortic valve regurgitation is mild. Aortic regurgitation PHT measures 614 msec. Aortic valve mean gradient measures 3.0 mmHg. Aortic valve peak gradient measures 4.8 mmHg. Aortic valve area, by VTI measures 2.37 cm. Pulmonic Valve: Pulmonic valve regurgitation is not visualized. Aorta: There is an aneurysm involving the aortic root measuring 40 mm. Venous: The inferior vena cava is normal in size with greater than 50% respiratory variability, suggesting right atrial pressure of 3 mmHg. IAS/Shunts: No atrial level shunt detected by color flow Doppler.  LEFT VENTRICLE PLAX 2D LVIDd:         5.00 cm     Diastology LVIDs:         4.00 cm     LV e' medial:    10.20 cm/s LV PW:         1.00 cm     LV E/e' medial:  8.0 LV IVS:        1.00 cm     LV e' lateral:   11.10 cm/s LVOT diam:     2.20 cm     LV E/e' lateral: 7.4 LV SV:         48 LV SV Index:   21 LVOT Area:     3.80 cm  LV Volumes (MOD) LV vol d, MOD A2C: 86.9 ml LV vol d, MOD A4C: 98.6 ml LV vol s, MOD A2C: 54.2 ml LV vol s, MOD A4C: 58.4 ml LV SV MOD A2C:     32.7 ml LV SV MOD A4C:     98.6 ml LV SV MOD BP:      34.8 ml RIGHT VENTRICLE RV Basal diam:  4.40 cm RV Mid diam:    4.40 cm RV S prime:     13.30 cm/s  TAPSE (M-mode): 2.1 cm LEFT ATRIUM              Index        RIGHT ATRIUM           Index LA diam:        4.60 cm  2.01 cm/m   RA Area:     20.90 cm LA Vol (A2C):   110.0 ml 48.14 ml/m  RA Volume:   53.60 ml  23.46 ml/m LA Vol (A4C):   75.3 ml  32.95 ml/m LA Biplane Vol: 94.2 ml  41.22 ml/m  AORTIC VALVE                    PULMONIC VALVE AV Area (Vmax):    2.72 cm     PV Vmax:       0.95 m/s AV Area (Vmean):   2.65 cm     PV Peak grad:  3.6 mmHg AV Area (VTI):     2.37 cm AV Vmax:           109.00 cm/s AV Vmean:          74.900 cm/s AV VTI:  0.202 m AV Peak Grad:      4.8 mmHg AV Mean Grad:      3.0 mmHg LVOT Vmax:         78.00 cm/s LVOT Vmean:        52.200 cm/s LVOT VTI:          0.126 m LVOT/AV VTI ratio: 0.62 AI PHT:            614 msec  AORTA Ao Root diam: 4.00 cm Ao Asc diam:  3.60 cm MITRAL VALVE               TRICUSPID VALVE MV Area (PHT): 4.60 cm    TR Peak grad:   28.9 mmHg MV Decel Time: 165 msec    TR Vmax:        269.00 cm/s MR Peak grad: 81.4 mmHg MR Vmax:      451.00 cm/s  SHUNTS MV E velocity: 82.10 cm/s  Systemic VTI:  0.13 m MV A velocity: 59.80 cm/s  Systemic Diam: 2.20 cm MV E/A ratio:  1.37 Placido Sou signed by Phineas Inches Signature Date/Time: 06/21/2022/2:08:13 PM    Final      LOS: 3 days   Antonieta Pert, MD [facility]  06/23/2022, 11:55 AM

## 2022-06-24 ENCOUNTER — Encounter (HOSPITAL_COMMUNITY): Payer: Self-pay | Admitting: Internal Medicine

## 2022-06-24 DIAGNOSIS — I5023 Acute on chronic systolic (congestive) heart failure: Secondary | ICD-10-CM | POA: Diagnosis not present

## 2022-06-24 LAB — BASIC METABOLIC PANEL
Anion gap: 9 (ref 5–15)
BUN: 30 mg/dL — ABNORMAL HIGH (ref 8–23)
CO2: 24 mmol/L (ref 22–32)
Calcium: 8.4 mg/dL — ABNORMAL LOW (ref 8.9–10.3)
Chloride: 104 mmol/L (ref 98–111)
Creatinine, Ser: 1.44 mg/dL — ABNORMAL HIGH (ref 0.61–1.24)
GFR, Estimated: 46 mL/min — ABNORMAL LOW (ref >60)
Glucose, Bld: 106 mg/dL — ABNORMAL HIGH (ref 70–99)
Potassium: 3.8 mmol/L (ref 3.5–5.1)
Sodium: 137 mmol/L (ref 135–145)

## 2022-06-24 LAB — PROTIME-INR
INR: 2.9 — ABNORMAL HIGH (ref 0.8–1.2)
Prothrombin Time: 30.2 seconds — ABNORMAL HIGH (ref 11.4–15.2)

## 2022-06-24 MED ORDER — METHOCARBAMOL 500 MG PO TABS: 500.0000 mg | ORAL_TABLET | Freq: Three times a day (TID) | ORAL | Status: DC | PRN

## 2022-06-24 MED ORDER — WARFARIN 1.25 MG HALF TABLET
1.2500 mg | ORAL_TABLET | Freq: Once | ORAL | Status: AC
  Administered 2022-06-24: 1.25 mg via ORAL
  Filled 2022-06-24: qty 1

## 2022-06-24 MED ORDER — METOPROLOL TARTRATE 25 MG PO TABS
25.0000 mg | ORAL_TABLET | Freq: Two times a day (BID) | ORAL | Status: DC
  Administered 2022-06-24 – 2022-07-02 (×13): 25 mg via ORAL
  Filled 2022-06-24 (×17): qty 1

## 2022-06-24 NOTE — Progress Notes (Signed)
ANTICOAGULATION CONSULT NOTE - Follow Up Consult  Pharmacy Consult for Warfarin Indication: atrial fibrillation  No Known Allergies  Patient Measurements: Height: 6\' 3"  (190.5 cm) Weight: 85.9 kg (189 lb 6 oz) IBW/kg (Calculated) : 84.5  Vital Signs: Temp: 97.9 F (36.6 C) (12/03 0513) Temp Source: Oral (12/03 0513) BP: 94/63 (12/03 0513) Pulse Rate: 65 (12/03 0513)  Labs: Recent Labs    06/22/22 0238 06/23/22 0131 06/24/22 0111 06/24/22 0136  HGB 11.8* 12.4*  --   --   HCT 35.0* 38.2*  --   --   PLT 254 255  --   --   LABPROT 32.8* 32.1* 30.2*  --   INR 3.2* 3.2* 2.9*  --   CREATININE 1.78* 1.47*  --  1.44*     Estimated Creatinine Clearance: 40.8 mL/min (A) (by C-G formula based on SCr of 1.44 mg/dL (H)).  Assessment: 88 yo M presented to ED as a level 2 trauma s/p a mechanical fall, followed by a second fall secondarty to dizziness and weakness on 06/20/22. Abrasions noted to posterior head and bilateral elbows and no active bleeding noted on presentation to ED. Pt has hx atrial fibrillation and on Warfarin PTA, which has been continued per Pharmacy dosing.  INR therapeutic (2.8) on admit and trended up to 3.2 on 12/1 after usual doses.  Warfarin held 12/1 and INR 3.2 again on 12/2  Possibly up in setting of heart failure. Low dose warfarin 0.5 mg given on 12/2 > INR 2.9 today.  PTA regimen: 2.5 mg daily except 1.25 mg on Thursdays.  Goal of Therapy:  INR 2-3 Monitor platelets by anticoagulation protocol: Yes   Plan:  Warfarin 1.25 mg x 1 today. Has recently been sensitive to doses. Daily PT/INR  12-23-1979, RPh 06/24/2022,11:29 AM

## 2022-06-24 NOTE — Progress Notes (Signed)
PROGRESS NOTE Hector Vazquez  [id number] DOB: 1929/08/29 DOA: 06/20/2022 PCP: Doreatha Lew, MD   Brief Narrative/Hospital Course:  88 y.o.m w/ history of CAD, PAF on Coumadin, HLD, CKD stage IIIb, peripheral neuropathy, peripheral edema secondary to venous insufficiency, chronic ambulatory dysfunction at baseline using cane to ambulate presented with multiple fall He has had multiple falls this year with multiple ED visits patient started to feel "my legs are so heavy" night before and next morning 06/20/22 became lightheaded standing up and fell down on his back and hit his neck and head without loss of consciousness.  Denies any preceding palpitation shortness of breath.  Claimed that his liquid more swollen in 2 months has chronic right foot ulcer care at [facility] wound care.  Lives by himself with no children and wife ED Course: Blood pressure borderline low, no tachycardia afebrile.  CT head and neck no acute findings.  WBC 10.0, K4.1, creatinine 1.6 compared to baseline 1.7, bicarb 22. Patient was found to have fluid overload, and 40 mg Lasix given in the ED Patient was admitted for near syncope and fall, orthostatic vitals ordered, also admitted with right-sided CHF significant peripheral edema given Lasix, echo ordered renal function repeated    Subjective: Seen and examined this morning, no new complaints resting comfortably some neck pain  Overnight afebrile Labs reviewed this morning creatinine stable 1.4 INR 2.9  Assessment and Plan: Principal Problem:   CHF (congestive heart failure) (H[facility]) Active Problems:   Acute on chronic systolic CHF (congestive heart failure) ([facility])   Impaired ambulation  Near syncope and fall Multiple falls at home: Likely multifactorial in the setting of patient's congestive heart failure, leg edema, wound and deconditioning.orthostatic positive placed on midodrine and compression stocking.  Cut down the dose of metoprolol, continue PT OT and  planning for skilled nursing facility. TSH nl, MAL B12 on lower side add b12 supplement, recent vita d normal  Acute on chronic CHF with right-sided heart failure Acute on chronic systolic CHF EF at [id number] now Edema: BNP elevated 741 with worsening peripheral edema, chest x-ray low volume chest with mild atelectasis.Echo shows EF 40-45%, global hypokinesia, normal RV systolic function.  Echo from 2021 with EF 50-50%.  Volume status improved BP soft cut back Lasix to 20 mg daily, monitor intake output and weight as below and improving significantly.  Placed on midodrine due to his orthostatics. He is followed by [facility]. Ola Spur[facility] at [facility] and will need close outpatient follow-up soon. Net IO Since Admission: -5,973 mL [06/24/22 1120]  Filed Weights   06/20/22 1008 06/24/22 0513  Weight: 99.8 kg 85.9 kg   Acute hypoxic respiratory failure : Resolved doing well on room air  Troponin 27-28 flat likely demand ischemia from CHF Hypokalemia: Repleted.  Hypertension: BP soft on orthostasis cut down the metoprolol, added midodrine.    PAF on Coumadin.  Metoprolol dose decreased, off Cardizem due to soft BP.  Rate controlled.  Continue to dose Coumadin-INR therapeutic as below, continue monitoring. Recent Labs  Lab 06/20/22 0940 06/21/22 0619 06/22/22 0238 06/23/22 0131 06/24/22 0111  INR 2.8* 2.8* 3.2* 3.2* 2.9*   Elevated intact PTH 110, a month ago  CKD stage IIIb: Baseline creatinine 1.6-1.8 in careeverywhere  since 2022 improved to 1.4 tolerating Lasix.Marland Kitchen Recent Labs    06/20/22 0940 06/21/22 0619 06/22/22 0238 06/23/22 0131 06/24/22 0136  BUN 30* 28* 32* 27* 30*  CREATININE 1.66* 1.84* 1.78* 1.47* 1.44*   Chronic right leg wound, consulted wound care.  Appreciate input. Neck Pain likely musculoskeletal, attributed to neck collar in ambulance per patient.  X-ray shows multilevel degenerative disc disease and facet arthropathy.  Continue Robaxin Norco, lidocaine patch.  DVT  prophylaxis: Place TED hose Start: 06/23/22 1512coumadin Code Status:   Code Status: DNR Family Communication: plan of care discussed with patient at bedside. Updated his friend- he has no family  Patient status is: Inpatient because of congestive failure management Level of care: Telemetry Medical   Dispo: The patient is from: Home lives alone            Anticipated disposition: SNF to [facility] burn in 24 hrs once approved  Objective: Vitals last 24 hrs: Vitals:   06/23/22 1700 06/23/22 2000 06/23/22 2008 06/24/22 0513  BP: (!) 93/55  (!) 103/48 94/63  Pulse: 87  61 65  Resp: 14  16 16   Temp: 98.1 F (36.7 C)  98 F (36.7 C) 97.9 F (36.6 C)  TempSrc: Oral  Oral Oral  SpO2: 99% 97% 97% 95%  Weight:    85.9 kg  Height:       Weight change:   Physical Examination: General exam: AAOX3,weak,older appearing HEENT:Oral mucosa moist, Ear/Nose WNL grossly, dentition normal. Respiratory system:Bilaterally clear BS,no use of accessory muscle Cardiovascular system:S1 & S2 +, regular rate,JVD neg. Gastrointestinal system:Abdomen soft, NT,ND,BS+ Nervous System:Alert, awake, moving extremities and grossly nonfocal Extremities:LE ankle edema improved with skin wrinkling, compression stocking in place Skin:No rashes,no icterus. muscle bulk,tone, power   Medications reviewed:  Scheduled Meds:  furosemide  20 mg Oral Daily   lidocaine  1 patch Transdermal Q24H   metoprolol tartrate  25 mg Oral BID   midodrine  5 mg Oral TID WC   mupirocin ointment   Topical BID   potassium chloride  20 mEq Oral Daily   pravastatin  20 mg Oral q1800   sodium chloride flush  3 mL Intravenous Q12H   Warfarin - Pharmacist Dosing Inpatient   Does not apply q1600  Continuous Infusions:  sodium chloride     methocarbamol (ROBAXIN) IV 500 mg (06/23/22 1235)    Diet Order             Diet Heart Room service appropriate? Yes; Fluid consistency: Thin; Fluid restriction: 2000 mL Fluid  Diet  effective now                  Intake/Output Summary (Last 24 hours) at 06/24/2022 1120 Last data filed at 06/24/2022 0513 Gross per 24 hour  Intake 172 ml  Output 1300 ml  Net -1128 ml    Net IO Since Admission: -5,973 mL [06/24/22 1120]  Wt Readings from Last 3 Encounters:  06/24/22 85.9 kg  12/19/21 97 kg  12/18/21 97.5 kg     Unresulted Labs (From admission, onward)     Start     Ordered   06/25/22 0500  Basic metabolic panel  Daily at 5am,   R     Question:  Specimen collection method  Answer:  Lab=Lab collect   06/24/22 0622   06/25/22 0500  CBC  Daily at 5am,   R     Question:  Specimen collection method  Answer:  Lab=Lab collect   06/24/22 0622   06/21/22 0500  Protime-INR  Daily at 5am,   R      06/20/22 1610          Data Reviewed: I have personally reviewed following labs and imaging studies CBC: Recent Labs  Lab  06/20/22 0940 06/21/22 0619 06/22/22 0238 06/23/22 0131  WBC 10.0 11.2* 10.5 9.3  NEUTROABS 7.8*  --   --   --   HGB 12.5* 12.7* 11.8* 12.4*  HCT 37.2* 39.4 35.0* 38.2*  MCV 103.6* 103.7* 100.3* 101.9*  PLT 266 287 254 [id number]    Basic Metabolic Panel: Recent Labs  Lab 06/20/22 0940 06/21/22 0619 06/22/22 0238 06/22/22 0531 06/23/22 0131 06/24/22 0136  NA 142 141 141  --  137 137  K 4.1 3.7 3.2*  --  3.6 3.8  CL 109 102 100  --  104 104  CO2 22 26 28   --  25 24  GLUCOSE 95 105* 119*  --  109* 106*  BUN 30* 28* 32*  --  27* 30*  CREATININE 1.66* 1.84* 1.78*  --  1.47* 1.44*  CALCIUM 8.7* 8.9 8.6*  --  8.4* 8.4*  MG  --  2.2  --  2.0  --   --   PHOS  --  3.1  --   --   --   --     GFR: Estimated Creatinine Clearance: 40.8 mL/min (A) (by C-G formula based on SCr of 1.44 mg/dL (H)). Liver Function Tests: Recent Labs  Lab 06/20/22 0940  AST 27  ALT 17  ALKPHOS 30*  BILITOT 0.9  PROT 6.0*  ALBUMIN 3.5    Culture/Microbiology No results found for: "SDES", "SPECREQUEST", "CULT", "REPTSTATUS"  Other culture-see note   Radiology Studies: DG Cervical Spine 2 or 3 views  Result Date: 06/22/2022 CLINICAL DATA:  Neck pain EXAM: CERVICAL SPINE - 2-3 VIEW COMPARISON:  None Available. FINDINGS: There is no radiographic evidence of cervical spine fracture. Straightening of the cervical lordosis with slight reversal centered at C3-C4. There is mild to moderate multilevel degenerative disc disease and moderate-severe multilevel facet arthropathy. IMPRESSION: No acute osseous abnormality. Multilevel degenerative disc disease and facet arthropathy. Electronically Signed   By: Maurine Simmering M.D.   On: 06/22/2022 12:13     LOS: 4 days   Antonieta Pert, MD [facility]  06/24/2022, 11:20 AM

## 2022-06-24 NOTE — TOC Progression Note (Signed)
Transition of Care [facility]) - Progression Note    Patient Details  Name: Hector Vazquez MRN: [id number] Date of Birth: 1930-02-18  Transition of Care [facility]) CM/SW Contact  Delilah Shan, LCSWA Phone Number: 06/24/2022, 4:31 PM  Clinical Narrative:     CSW received consult patient now in agreement to SNF placement. Patient requesting SNF placement at [facility]. CSW faxed over patients initial referral to [facility] for review. CSW called [facility] to see if they accept patients insurance. [place] with [facility] gone for the day. CSW will follow up with facility in the am. CSW will continue to follow and assist with patients dc planning needs.   Expected Discharge Plan:  (Home interested in hh services) Barriers to Discharge: Continued Medical Work up  Expected Discharge Plan and Services Expected Discharge Plan:  (Home interested in hh services) In-house Referral: Clinical Social Work     Living arrangements for the past 2 months: Single Family Home                                       Social Determinants of Health (SDOH) Interventions    Readmission Risk Interventions     No data to display

## 2022-06-24 NOTE — Progress Notes (Signed)
Mobility Specialist - Progress Note   06/24/22 1402  Mobility  Activity Stood at bedside  Level of Assistance Minimal assist, patient does 75% or more  Assistive Device Front wheel walker  Activity Response Tolerated fair  Mobility Referral Yes  $Mobility charge 1 Mobility   Pt received in bed and agreeable. No complaints throughout. Pt was left in bed with all needs met.  [name]  Mobility Specialist Please contact via Solicitor or Rehab office at [phone]

## 2022-06-25 DIAGNOSIS — I5023 Acute on chronic systolic (congestive) heart failure: Secondary | ICD-10-CM | POA: Diagnosis not present

## 2022-06-25 LAB — CBC
HCT: 36.5 % — ABNORMAL LOW (ref 39.0–52.0)
Hemoglobin: 12.5 g/dL — ABNORMAL LOW (ref 13.0–17.0)
MCH: 34.2 pg — ABNORMAL HIGH (ref 26.0–34.0)
MCHC: 34.2 g/dL (ref 30.0–36.0)
MCV: 99.7 fL (ref 80.0–100.0)
Platelets: 286 10*3/uL (ref 150–400)
RBC: 3.66 MIL/uL — ABNORMAL LOW (ref 4.22–5.81)
RDW: 13.8 % (ref 11.5–15.5)
WBC: 8.5 10*3/uL (ref 4.0–10.5)
nRBC: 0 % (ref 0.0–0.2)

## 2022-06-25 LAB — BASIC METABOLIC PANEL
Anion gap: 8 (ref 5–15)
BUN: 28 mg/dL — ABNORMAL HIGH (ref 8–23)
CO2: 24 mmol/L (ref 22–32)
Calcium: 8.2 mg/dL — ABNORMAL LOW (ref 8.9–10.3)
Chloride: 105 mmol/L (ref 98–111)
Creatinine, Ser: 1.49 mg/dL — ABNORMAL HIGH (ref 0.61–1.24)
GFR, Estimated: 44 mL/min — ABNORMAL LOW (ref >60)
Glucose, Bld: 105 mg/dL — ABNORMAL HIGH (ref 70–99)
Potassium: 4.2 mmol/L (ref 3.5–5.1)
Sodium: 137 mmol/L (ref 135–145)

## 2022-06-25 LAB — PROTIME-INR
INR: 1.9 — ABNORMAL HIGH (ref 0.8–1.2)
Prothrombin Time: 21.5 seconds — ABNORMAL HIGH (ref 11.4–15.2)

## 2022-06-25 MED ORDER — WARFARIN SODIUM 2.5 MG PO TABS
2.5000 mg | ORAL_TABLET | Freq: Once | ORAL | Status: AC
  Administered 2022-06-25: 2.5 mg via ORAL
  Filled 2022-06-25: qty 1

## 2022-06-25 NOTE — Progress Notes (Signed)
PT Cancellation Note  Patient Details Name: Hector Vazquez MRN: [id number] DOB: [birth year]   Cancelled Treatment:    Reason Eval/Treat Not Completed: Other (comment).  Declined PT to walk but then agreed to bed ex.  Declined for legs to be uncovered, so will retry at another time.   Ivar Drape 06/25/2022, 12:12 PM  Samul Dada, PT PhD Acute Rehab Dept. Number: [facility] [id number] and [facility] [phone]

## 2022-06-25 NOTE — Progress Notes (Signed)
Occupational Therapy Treatment Patient Details Name: Hector Vazquez MRN: [id number] DOB: [birth year] Today's Date: 06/25/2022   History of present illness Hector Vazquez is a 88 y.o. male with medical history significant of CAD, PAF on Coumadin, HLD, CKD stage IIIb, peripheral neuropathy, peripheral edema secondary to venous insufficiency, came with frequent falls.   OT comments  Pt progressing towards goals this session, completing 2 seated grooming tasks and UB/LB therex at EOB. Pt able to sit EOB x15 mins needing increased cues to keep feet on floor to prevent posterior lean. Pt presenting with impairments listed below, will follow acutely. Continue to recommend SNF at d/c.   Recommendations for follow up therapy are one component of a multi-disciplinary discharge planning process, led by the attending physician.  Recommendations may be updated based on patient status, additional functional criteria and insurance authorization.    Follow Up Recommendations  Skilled nursing-short term rehab (<3 hours/day)     Assistance Recommended at Discharge Intermittent Supervision/Assistance  Patient can return home with the following  A little help with walking and/or transfers;A little help with bathing/dressing/bathroom;Assistance with cooking/housework;Direct supervision/assist for medications management;Direct supervision/assist for financial management;Assist for transportation;Help with stairs or ramp for entrance   Equipment Recommendations  None recommended by OT (defer)    Recommendations for Other Services      Precautions / Restrictions Precautions Precautions: Fall Precaution Comments: 2 recent per pt Required Braces or Orthoses: Other Brace Other Brace: wears heel cups in his shoes due to heel pain; R foot with ulcer under first met head Restrictions Weight Bearing Restrictions: No       Mobility Bed Mobility Overal bed mobility: Needs Assistance Bed Mobility: Sidelying to Sit, Sit  to Sidelying   Sidelying to sit: Min guard, HOB elevated     Sit to sidelying: Min assist, HOB elevated General bed mobility comments: increased time, use of bed rails and cues to scoot to EOB    Transfers                   General transfer comment: simulated lateral scoot trasnfer at EOB with min A     Balance Overall balance assessment: Needs assistance   Sitting balance-Leahy Scale: Fair Sitting balance - Comments: post leaning, cues to keep feet on floor                                   ADL either performed or assessed with clinical judgement   ADL       Grooming: Oral care;Sitting;Brushing hair                                      Extremity/Trunk Assessment Upper Extremity Assessment Upper Extremity Assessment: Generalized weakness   Lower Extremity Assessment Lower Extremity Assessment: Generalized weakness        Vision   Vision Assessment?: No apparent visual deficits   Perception Perception Perception: Not tested   Praxis Praxis Praxis: Not tested    Cognition Arousal/Alertness: Awake/alert Behavior During Therapy: WFL for tasks assessed/performed Overall Cognitive Status: No family/caregiver present to determine baseline cognitive functioning                                 General Comments: hyperverbal, appropriate, able to follow simple commands  Exercises Exercises: General Upper Extremity, General Lower Extremity General Exercises - Upper Extremity Shoulder Flexion: AROM, Both, 10 reps, Seated General Exercises - Lower Extremity Ankle Circles/Pumps: AROM, Both, 10 reps, Seated Long Arc Quad: AROM, Both, 10 reps, Seated    Shoulder Instructions       General Comments VSS on RA    Pertinent Vitals/ Pain       Pain Assessment Pain Assessment: No/denies pain  Home Living                                          Prior Functioning/Environment               Frequency  Min 2X/week        Progress Toward Goals  OT Goals(current goals can now be found in the care plan section)  Progress towards OT goals: Progressing toward goals  Acute Rehab OT Goals Patient Stated Goal: none stated OT Goal Formulation: With patient Time For Goal Achievement: 07/06/22 Potential to Achieve Goals: Good ADL Goals Pt Will Perform Grooming: standing;with modified independence Pt Will Perform Lower Body Dressing: with modified independence;sit to/from stand Pt Will Transfer to Toilet: with modified independence;ambulating;bedside commode Additional ADL Goal #1: Pt will demonstrate safe use of RW during ADL with no verbal cues for safety.  Plan Discharge plan remains appropriate;Frequency remains appropriate    Co-evaluation                 AM-PAC OT "6 Clicks" Daily Activity     Outcome Measure   Help from another person eating meals?: None Help from another person taking care of personal grooming?: A Little Help from another person toileting, which includes using toliet, bedpan, or urinal?: A Little Help from another person bathing (including washing, rinsing, drying)?: A Lot Help from another person to put on and taking off regular upper body clothing?: A Little Help from another person to put on and taking off regular lower body clothing?: A Lot 6 Click Score: 17    End of Session    OT Visit Diagnosis: Unsteadiness on feet (R26.81);Muscle weakness (generalized) (M62.81);Other abnormalities of gait and mobility (R26.89);Other symptoms and signs involving cognitive function;Pain   Activity Tolerance Patient tolerated treatment well   Patient Left in bed;with call bell/phone within reach;with bed alarm set   Nurse Communication Mobility status (confirmed with Kc MD can see pt, MD stating only bed level/bedside tx today due to INR level)        Time: 4536-4680 OT Time Calculation (min): 27 min  Charges: OT General  Charges $OT Visit: 1 Visit OT Treatments $Self Care/Home Management : 8-22 mins $Therapeutic Exercise: 8-22 mins  Renaye Rakers, OTD, OTR/L SecureChat Preferred Acute Rehab [phone] - [place] 06/25/2022, 3:43 PM

## 2022-06-25 NOTE — Progress Notes (Signed)
ANTICOAGULATION CONSULT NOTE - Follow Up Consult  Pharmacy Consult for Warfarin Indication: atrial fibrillation  No Known Allergies  Patient Measurements: Height: 6\' 3"  (190.5 cm) Weight: 87.4 kg (192 lb 11.2 oz) IBW/kg (Calculated) : 84.5  Vital Signs: Temp: 97.6 F (36.4 C) (12/04 0635) Temp Source: Oral (12/04 0635) BP: 99/69 (12/04 1033) Pulse Rate: 91 (12/04 1041)  Labs: Recent Labs    06/23/22 0131 06/24/22 0111 06/24/22 0136 06/25/22 0544  HGB 12.4*  --   --  12.5*  HCT 38.2*  --   --  36.5*  PLT 255  --   --  286  LABPROT 32.1* 30.2*  --  21.5*  INR 3.2* 2.9*  --  1.9*  CREATININE 1.47*  --  1.44* 1.49*     Estimated Creatinine Clearance: 39.4 mL/min (A) (by C-G formula based on SCr of 1.49 mg/dL (H)).  Assessment: 88 yo M presented to ED as a level 2 trauma s/p a mechanical fall, followed by a second fall secondarty to dizziness and weakness on 06/20/22. Abrasions noted to posterior head and bilateral elbows and no active bleeding noted on presentation to ED. Pt has hx atrial fibrillation and on Warfarin PTA, which has been continued per Pharmacy dosing.  INR is slightly subtherapeutic at 1.9 CBC stable. No s/sx of bleeding.   PTA regimen: 2.5 mg daily except 1.25 mg on Thursdays.  Goal of Therapy:  INR 2-3 Monitor platelets by anticoagulation protocol: Yes   Plan:  Warfarin 2.5 mg tonight  Daily PT/INR  12-23-1979, PharmD, BCCCP Clinical Pharmacist  Phone: [phone] 06/25/2022 11:11 AM  Please check AMION for all [facility] Pharmacy phone numbers After 10:00 PM, call Main Pharmacy [phone]

## 2022-06-25 NOTE — Progress Notes (Signed)
PROGRESS NOTE Hector Vazquez  AXE:[id number] DOB: May 04, 1930 DOA: 06/20/2022 PCP: Lenox Ponds, MD   Brief Narrative/Hospital Course:  88 y.o.m w/ history of CAD, PAF on Coumadin, HLD, CKD stage IIIb, peripheral neuropathy, peripheral edema secondary to venous insufficiency, chronic ambulatory dysfunction at baseline using cane to ambulate presented with multiple fall He has had multiple falls this year with multiple ED visits patient started to feel "my legs are so heavy" night before and next morning 06/20/22 became lightheaded standing up and fell down on his back and hit his neck and head without loss of consciousness.  Denies any preceding palpitation shortness of breath.  Claimed that his liquid more swollen in 2 months has chronic right foot ulcer care at [facility] wound care.  Lives by himself with no children and wife ED Course: Blood pressure borderline low, no tachycardia afebrile.  CT head and neck no acute findings.  WBC 10.0, K4.1, creatinine 1.6 compared to baseline 1.7, bicarb 22. Patient was found to have fluid overload, and 40 mg Lasix given in the ED Patient was admitted for near syncope and fall, orthostatic vitals ordered, also admitted with right-sided CHF significant peripheral edema given Lasix, echo ordered renal function repeated  Echo EF 40-45%, diuresed and leg edema improved overall feeling much better weight down. Seen by PT OT wound care.  Plan is for skilled nursing facility once bed available  Subjective: Seen examined this morning his close friend at the bedside he is resting comfortably.  He has no new complaints  BP soft in the high 90s relatively asymptomatic neck pain is stable if pillows positioned correctly.  Assessment and Plan: Principal Problem:   CHF (congestive heart failure) (HCC) Active Problems:   Acute on chronic systolic CHF (congestive heart failure) (HCC)   Impaired ambulation  Near syncope and fall Multiple falls at home: Likely  multifactorial in the setting of patient's congestive heart failure, leg edema, wound and deconditioning.orthostatic positive placed on midodrine and compression stocking.  Metoprolol dose has been taken days.  Continue to mobilize with PT OT. TSH nl, MAL B12 on lower side added b12 supplement, recent vita d normal.  Continue PT OT and planning for SNF.  Repeat orthostatics today-he will start on midodrine for his orthostatic hypotension  Acute on chronic CHF with right-sided heart failure Acute on chronic systolic CHF EF at 40-45% now Edema: BNP elevated 741 with worsening peripheral edema, chest x-ray low volume chest with mild atelectasis.Echo shows EF 40-45%, global hypokinesia, normal RV systolic function.  Echo from 2021 with EF 50-50%.  Volume status.  -He reports he was taking less 6 at home, continue Lasix 20 mg daily.  Weight has improved as below.  Continue midodrine for blood pressure support.He is followed by Dr. Sampson Goon at [facility] and will need close outpatient follow-up soon. Net IO Since Admission: -6,813 mL [06/25/22 1354]  Filed Weights   06/20/22 1008 06/24/22 0513 06/25/22 0635  Weight: 99.8 kg 85.9 kg 87.4 kg   Acute hypoxic respiratory failure : Resolved doing well on room air  Troponin 27-28 flat likely demand ischemia from CHF Hypokalemia: Repleted. Hypertension: BP soft - we have reduced metoprolol, added midodrine.   PAF on Coumadin.  Metoprolol dose decreased, off Cardizem due to soft BP.  Remains controlled INR being monitored by pharmacy Recent Labs  Lab 06/21/22 0619 06/22/22 0238 06/23/22 0131 06/24/22 0111 06/25/22 0544  INR 2.8* 3.2* 3.2* 2.9* 1.9*   Elevated intact PTH 110, a month ago CKD  stage IIIb: Baseline creatinine 1.6-1.8 in careeverywhere  since 2022> now improved to 1.4 tolerating Lasix.  Monitor Recent Labs    06/20/22 0940 06/21/22 0619 06/22/22 0238 06/23/22 0131 06/24/22 0136 06/25/22 0544  BUN 30* 28* 32* 27* 30* 28*   CREATININE 1.66* 1.84* 1.78* 1.47* 1.44* 1.49*   Chronic right leg wound, cont wound care.  Appreciate input. Neck Pain -musculoskeletal, he attributes to neck collar in ambulance : X-ray shows multilevel degenerative disc disease and facet arthropathy.  Continue Robaxin Norco, lidocaine patch.  DVT prophylaxis: Place TED hose Start: 06/23/22 1512coumadin Code Status:   Code Status: DNR Family Communication: plan of care discussed with patient at bedside. Updated his friend- he has no family  Patient status is: Inpatient because of congestive failure management Level of care: Telemetry Medical   Dispo: The patient is from: Home lives alone            Anticipated disposition: SNF to [facility] burn or others once available  Objective: Vitals last 24 hrs: Vitals:   06/24/22 2134 06/25/22 0635 06/25/22 1033 06/25/22 1041  BP: (!) 91/49 108/62 99/69   Pulse:  73 (!) 114 91  Resp:  18    Temp:  97.6 F (36.4 C)    TempSrc:  Oral    SpO2:  94%  98%  Weight:  87.4 kg    Height:       Weight change: 1.508 kg  Physical Examination: General exam: AAox3, weak,older appearing HEENT:Oral mucosa moist, Ear/Nose WNL grossly, dentition normal. Respiratory system: bilaterally clear BS,no use of accessory muscle Cardiovascular system: S1 & S2 +, regular rate, JVD neg. Gastrointestinal system: Abdomen soft,NT,ND,BS+ Nervous System:Alert, awake, moving extremities and grossly nonfocal Extremities: LE ankle edema improved, chronic skin changes, compression stocking in place Skin: No rashes,no icterus. [id number] muscle bulk,tone, power   Medications reviewed:  Scheduled Meds:  furosemide  20 mg Oral Daily   lidocaine  1 patch Transdermal Q24H   metoprolol tartrate  25 mg Oral BID   midodrine  5 mg Oral TID WC   mupirocin ointment   Topical BID   potassium chloride  20 mEq Oral Daily   pravastatin  20 mg Oral q1800   sodium chloride flush  3 mL Intravenous Q12H   warfarin  2.5 mg Oral  ONCE-1600   Warfarin - Pharmacist Dosing Inpatient   Does not apply q1600  Continuous Infusions:  sodium chloride      Diet Order             Diet Heart Room service appropriate? Yes; Fluid consistency: Thin; Fluid restriction: 2000 mL Fluid  Diet effective now                  Intake/Output Summary (Last 24 hours) at 06/25/2022 1354 Last data filed at 06/25/2022 0634 Gross per 24 hour  Intake 480 ml  Output 1320 ml  Net -840 ml    Net IO Since Admission: -6,813 mL [06/25/22 1354]  Wt Readings from Last 3 Encounters:  06/25/22 87.4 kg  12/19/21 97 kg  12/18/21 97.5 kg     Unresulted Labs (From admission, onward)     Start     Ordered   06/25/22 [id number]  Basic metabolic panel  Daily at 5am,   R     Question:  Specimen collection method  Answer:  Lab=Lab collect   06/24/22 0622   06/25/22 0500  CBC  Daily at 5am,   R     Question:  Specimen collection method  Answer:  Lab=Lab collect   06/24/22 0622   06/21/22 0500  Protime-INR  Daily at 5am,   R      06/20/22 1610          Data Reviewed: I have personally reviewed following labs and imaging studies CBC: Recent Labs  Lab 06/20/22 0940 06/21/22 0619 06/22/22 0238 06/23/22 0131 06/25/22 0544  WBC 10.0 11.2* 10.5 9.3 8.5  NEUTROABS 7.8*  --   --   --   --   HGB 12.5* 12.7* 11.8* 12.4* 12.5*  HCT 37.2* 39.4 35.0* 38.2* 36.5*  MCV 103.6* 103.7* 100.3* 101.9* 99.7  PLT 266 287 254 255 286    Basic Metabolic Panel: Recent Labs  Lab 06/21/22 0619 06/22/22 0238 06/22/22 0531 06/23/22 0131 06/24/22 0136 06/25/22 0544  NA 141 141  --  137 137 137  K 3.7 3.2*  --  3.6 3.8 4.2  CL 102 100  --  104 104 105  CO2 26 28  --  25 24 24   GLUCOSE 105* 119*  --  109* 106* 105*  BUN 28* 32*  --  27* 30* 28*  CREATININE 1.84* 1.78*  --  1.47* 1.44* 1.49*  CALCIUM 8.9 8.6*  --  8.4* 8.4* 8.2*  MG 2.2  --  2.0  --   --   --   PHOS 3.1  --   --   --   --   --    Creatinine Clearance: 39.4 mL/min (A) (by  C-G formula based on SCr of 1.49 mg/dL (H)).Liver Function Tests: Recent Labs  Lab 06/20/22 0940  AST 27  ALT 17  ALKPHOS 30*  BILITOT 0.9  PROT 6.0*  ALBUMIN 3.5   Culture/Microbiology No results found for: "SDES", "SPECREQUEST", "CULT", "REPTSTATUS"  Other culture-see note  Radiology Studies: No results found.  LOS: 5 days  06/22/22, MD [facility]  06/25/2022, 1:54 PM

## 2022-06-25 NOTE — TOC Progression Note (Addendum)
Transition of Care [facility]) - Progression Note    Patient Details  Name: Hector Vazquez MRN: [id number] Date of Birth: 05-29-1930  [facility]) CM/SW Contact  Delilah Shan, LCSWA Phone Number: 06/25/2022, 12:11 PM  Clinical Narrative:     CSW called Whitney with [facility] who confirmed they do accept patients insurance but cannot offer SNF bed for patient. Whitney informed CSW no bed availability, and will not have a bed open up until maybe a week from now. CSW informed patient. Patient request for CSW to call [name] to help assist with his snf search/dc plan.CSW spoke with [name] who gave CSW permission to fax out patients initial referral near the [place], [place], [place] area for possible SNF placement. CSW to follow up with [name] tomorrow with patients SNF bed offers. CSW will continue to follow and assist with patients dc planning needs.   Expected Discharge Plan:  (Home interested in hh services) Barriers to Discharge: Continued Medical Work up  Expected Discharge Plan and Services Expected Discharge Plan:  (Home interested in hh services) In-house Referral: Clinical Social Work     Living arrangements for the past 2 months: Single Family Home                                       Social Determinants of Health (SDOH) Interventions    Readmission Risk Interventions     No data to display

## 2022-06-26 DIAGNOSIS — I5021 Acute systolic (congestive) heart failure: Secondary | ICD-10-CM

## 2022-06-26 LAB — BASIC METABOLIC PANEL
Anion gap: 5 (ref 5–15)
BUN: 28 mg/dL — ABNORMAL HIGH (ref 8–23)
CO2: 25 mmol/L (ref 22–32)
Calcium: 8.3 mg/dL — ABNORMAL LOW (ref 8.9–10.3)
Chloride: 106 mmol/L (ref 98–111)
Creatinine, Ser: 1.37 mg/dL — ABNORMAL HIGH (ref 0.61–1.24)
GFR, Estimated: 49 mL/min — ABNORMAL LOW (ref >60)
Glucose, Bld: 92 mg/dL (ref 70–99)
Potassium: 4 mmol/L (ref 3.5–5.1)
Sodium: 136 mmol/L (ref 135–145)

## 2022-06-26 LAB — PROTIME-INR
INR: 1.8 — ABNORMAL HIGH (ref 0.8–1.2)
Prothrombin Time: 20.5 seconds — ABNORMAL HIGH (ref 11.4–15.2)

## 2022-06-26 MED ORDER — MELATONIN 3 MG PO TABS
3.0000 mg | ORAL_TABLET | Freq: Every evening | ORAL | Status: DC | PRN
  Administered 2022-06-26 – 2022-07-01 (×5): 3 mg via ORAL
  Filled 2022-06-26 (×5): qty 1

## 2022-06-26 MED ORDER — WARFARIN SODIUM 2 MG PO TABS
3.0000 mg | ORAL_TABLET | Freq: Once | ORAL | Status: AC
  Administered 2022-06-26: 3 mg via ORAL
  Filled 2022-06-26: qty 1

## 2022-06-26 NOTE — Discharge Instructions (Addendum)
Information on my medicine - Coumadin?   (Warfarin) ? ?Why was Coumadin prescribed for you? ?Coumadin was prescribed for you because you have a blood clot or a medical condition that can cause an increased risk of forming blood clots. Blood clots can cause serious health problems by blocking the flow of blood to the heart, lung, or brain. Coumadin can prevent harmful blood clots from forming. ?As a reminder your indication for Coumadin is:  Stroke Prevention because of Atrial Fibrillation ? ?What test will check on my response to Coumadin? ?While on Coumadin (warfarin) you will need to have an INR test regularly to ensure that your dose is keeping you in the desired range. The INR (international normalized ratio) number is calculated from the result of the laboratory test called prothrombin time (PT). ? ?If an INR APPOINTMENT HAS NOT ALREADY BEEN MADE FOR YOU please schedule an appointment to have this lab work done by your health care provider within 7 days. ?Your INR goal is usually a number between:  2 to 3 or your provider may give you a more narrow range like 2-2.5.  Ask your health care provider during an office visit what your goal INR is. ? ?What  do you need to  know  About  COUMADIN? ?Take Coumadin (warfarin) exactly as prescribed by your healthcare provider about the same time each day.  DO NOT stop taking without talking to the doctor who prescribed the medication.  Stopping without other blood clot prevention medication to take the place of Coumadin may increase your risk of developing a new clot or stroke.  Get refills before you run out. ? ?What do you do if you miss a dose? ?If you miss a dose, take it as soon as you remember on the same day then continue your regularly scheduled regimen the next day.  Do not take two doses of Coumadin at the same time. ? ?Important Safety Information ?A possible side effect of Coumadin (Warfarin) is an increased risk of bleeding. You should call your healthcare  provider right away if you experience any of the following: ?Bleeding from an injury or your nose that does not stop. ?Unusual colored urine (red or dark brown) or unusual colored stools (red or black). ?Unusual bruising for unknown reasons. ?A serious fall or if you hit your head (even if there is no bleeding). ? ?Some foods or medicines interact with Coumadin? (warfarin) and might alter your response to warfarin. To help avoid this: ?Eat a balanced diet, maintaining a consistent amount of Vitamin K. ?Notify your provider about major diet changes you plan to make. ?Avoid alcohol or limit your intake to 1 drink for women and 2 drinks for men per day. ?(1 drink is 5 oz. wine, 12 oz. beer, or 1.5 oz. liquor.) ? ?Make sure that ANY health care provider who prescribes medication for you knows that you are taking Coumadin (warfarin).  Also make sure the healthcare provider who is monitoring your Coumadin knows when you have started a new medication including herbals and non-prescription products. ? ?Coumadin? (Warfarin)  Major Drug Interactions  ?Increased Warfarin Effect Decreased Warfarin Effect  ?Alcohol (large quantities) ?Antibiotics (esp. Septra/Bactrim, Flagyl, Cipro) ?Amiodarone (Cordarone) ?Aspirin (ASA) ?Cimetidine (Tagamet) ?Megestrol (Megace) ?NSAIDs (ibuprofen, naproxen, etc.) ?Piroxicam (Feldene) ?Propafenone (Rythmol SR) ?Propranolol (Inderal) ?Isoniazid (INH) ?Posaconazole (Noxafil) Barbiturates (Phenobarbital) ?Carbamazepine (Tegretol) ?Chlordiazepoxide (Librium) ?Cholestyramine (Questran) ?Griseofulvin ?Oral Contraceptives ?Rifampin ?Sucralfate (Carafate) ?Vitamin K  ? ?Coumadin? (Warfarin) Major Herbal Interactions  ?Increased Warfarin Effect Decreased Warfarin Effect  ?  Garlic ?Ginseng ?Ginkgo biloba Coenzyme Q10 ?Green tea ?St. John?s wort   ? ?Coumadin? (Warfarin) FOOD Interactions  ?Eat a consistent number of servings per week of foods HIGH in Vitamin K ?(1 serving = ? cup)  ?Collards (cooked, or  boiled & drained) ?Kale (cooked, or boiled & drained) ?Mustard greens (cooked, or boiled & drained) ?Parsley *serving size only = ? cup ?Spinach (cooked, or boiled & drained) ?Swiss chard (cooked, or boiled & drained) ?Turnip greens (cooked, or boiled & drained)  ?Eat a consistent number of servings per week of foods MEDIUM-HIGH in Vitamin K ?(1 serving = 1 cup)  ?Asparagus (cooked, or boiled & drained) ?Broccoli (cooked, boiled & drained, or raw & chopped) ?Brussel sprouts (cooked, or boiled & drained) *serving size only = ? cup ?Lettuce, raw (green leaf, endive, romaine) ?Spinach, raw ?Turnip greens, raw & chopped  ? ?These websites have more information on Coumadin (warfarin):  www.coumadin.com; ?www.ahrq.gov/consumer/coumadin.htm; ? ? ?

## 2022-06-26 NOTE — Progress Notes (Signed)
Physical Therapy Treatment Patient Details Name: Hector Vazquez MRN: [id number] DOB: 1930/01/19 Today's Date: 06/26/2022   History of Present Illness Hector Vazquez is a 88 y.o. male with medical history significant of CAD, PAF on Coumadin, HLD, CKD stage IIIb, peripheral neuropathy, peripheral edema secondary to venous insufficiency, came with frequent falls.    PT Comments    Pt was seen for mobility on side of bed with mod assist to get to side and mod to stand up to transition to [facility].  Pt is a bit anxious today and requires reassurance and redirection the entire session.  At the end was assisted to be repositioned on bed, and immediately called nursing.  Asked pt what he needed and could not tell the PT.  Per CNA pt has asked if he needs to walk to BR but was assured to rest.  Pt is weak, unsafe to go farther than he is currently.  However, seems motivated to continue to work on mobility at SNF to increase standing endurance and control of sit to stand toward setting up to walk.  Follow acute PT goals as outlined on POC.  Recommendations for follow up therapy are one component of a multi-disciplinary discharge planning process, led by the attending physician.  Recommendations may be updated based on patient status, additional functional criteria and insurance authorization.  Follow Up Recommendations  Skilled nursing-short term rehab (<3 hours/day) Can patient physically be transported by private vehicle: No   Assistance Recommended at Discharge Frequent or constant Supervision/Assistance  Patient can return home with the following A lot of help with walking and/or transfers;A lot of help with bathing/dressing/bathroom;Assistance with cooking/housework;Direct supervision/assist for medications management;Direct supervision/assist for financial management;Assist for transportation;Help with stairs or ramp for entrance   Equipment Recommendations  None recommended by PT    Recommendations for Other  Services       Precautions / Restrictions Precautions Precautions: Fall Precaution Comments: recent falls Restrictions Weight Bearing Restrictions: No     Mobility  Bed Mobility Overal bed mobility: Needs Assistance Bed Mobility: Sidelying to Sit, Sit to Sidelying, Rolling Rolling: Min assist Sidelying to sit: Min assist     Sit to sidelying: Min assist      Transfers Overall transfer level: Needs assistance Equipment used: Rolling walker (2 wheels) Transfers: Sit to/from Stand Sit to Stand: Mod assist           General transfer comment: mod to stand but is distracted and anxious    Ambulation/Gait Ambulation/Gait assistance: Min assist Gait Distance (Feet): 12 Feet (6 x 2) Assistive device: Rolling walker (2 wheels) Gait Pattern/deviations: Step-to pattern, Decreased stride length, Wide base of support Gait velocity: reduced Gait velocity interpretation: <1.31 ft/sec, indicative of household ambulator Pre-gait activities: standing balance ck General Gait Details: steps are attached to getting to [facility] and back   Stairs             Wheelchair Mobility    Modified Rankin (Stroke Patients Only)       Balance Overall balance assessment: History of Falls, Needs assistance Sitting-balance support: Feet supported Sitting balance-Leahy Scale: Fair Sitting balance - Comments: sits with fair balance on side of bed, cannot fail to supervise him Postural control: Posterior lean Standing balance support: Bilateral upper extremity supported, During functional activity Standing balance-Leahy Scale: Poor Standing balance comment: min assist after initially mod assist to get to stand and capture balance  Cognition Arousal/Alertness: Awake/alert Behavior During Therapy: Agitated, Anxious Overall Cognitive Status: No family/caregiver present to determine baseline cognitive functioning                                  General Comments: anxiously worrying but able to follow directions        Exercises      General Comments General comments (skin integrity, edema, etc.): Pt has slow progression with gait and requires assistance to get sequenced to get to [facility], requires mult reinstructions of sequence and safety.      Pertinent Vitals/Pain Pain Assessment Pain Assessment: No/denies pain    Home Living                          Prior Function            PT Goals (current goals can now be found in the care plan section) Acute Rehab PT Goals Patient Stated Goal: agreeable to short term rehab; hopeful to return to independent Progress towards PT goals: Progressing toward goals    Frequency    Min 3X/week      PT Plan Current plan remains appropriate    Co-evaluation              AM-PAC PT "6 Clicks" Mobility   Outcome Measure  Help needed turning from your back to your side while in a flat bed without using bedrails?: A Little Help needed moving from lying on your back to sitting on the side of a flat bed without using bedrails?: A Lot Help needed moving to and from a bed to a chair (including a wheelchair)?: A Lot Help needed standing up from a chair using your arms (e.g., wheelchair or bedside chair)?: A Lot Help needed to walk in hospital room?: A Lot Help needed climbing 3-5 steps with a railing? : Total 6 Click Score: 12    End of Session Equipment Utilized During Treatment: Gait belt Activity Tolerance: Patient limited by fatigue;Treatment limited secondary to agitation Patient left: in bed;with call bell/phone within reach;with bed alarm set Nurse Communication: Mobility status PT Visit Diagnosis: Other abnormalities of gait and mobility (R26.89);Repeated falls (R29.6);History of falling (Z91.81)     Time: 1420-1450 PT Time Calculation (min) (ACUTE ONLY): 30 min  Charges:  $Therapeutic Activity: 23-37 mins      Ramond Dial 06/26/2022, 3:29  PM  Mee Hives, PT PhD Acute Rehab Dept. Number: [place] and [place]

## 2022-06-26 NOTE — Plan of Care (Signed)
  Problem: Education: Goal: Knowledge of General Education information will improve Description: Including pain rating scale, medication(s)/side effects and non-pharmacologic comfort measures Outcome: Progressing   Problem: Health Behavior/Discharge Planning: Goal: Ability to manage health-related needs will improve Outcome: Progressing   Problem: Clinical Measurements: Goal: Will remain free from infection Outcome: Progressing Goal: Cardiovascular complication will be avoided Outcome: Progressing   

## 2022-06-26 NOTE — TOC Progression Note (Addendum)
Transition of Care [facility]) - Progression Note    Patient Details  Name: Hector Vazquez MRN: [id number] Date of Birth: 08-24-1929  [facility] CM/SW Contact  Delilah Shan, LCSWA Phone Number: 06/26/2022, 2:36 PM  Clinical Narrative:     Shade Flood- CSW spoke with [place] with [facility] who confirmed she will start insurance authorization for patient.  CSW spoke with patients friend [name] and provided SNF bed offers for patient. [name] accepted SNF bed offer with [facility] for patient. CSW spoke with [place] with [facility] who confirmed SNF bed offer for patient.All questions answered. No further questions reported at this time. CSW updated patient. [place] confirmed after current PT note is in she will start insurance authorization for patient. CSW informed PT. CSW will continue to follow and assist with patients dc planning needs.   Expected Discharge Plan:  (Home interested in hh services) Barriers to Discharge: Continued Medical Work up  Expected Discharge Plan and Services Expected Discharge Plan:  (Home interested in hh services) In-house Referral: Clinical Social Work     Living arrangements for the past 2 months: Single Family Home                                       Social Determinants of Health (SDOH) Interventions    Readmission Risk Interventions     No data to display

## 2022-06-26 NOTE — NC FL2 (Signed)
[place] MEDICAID FL2 LEVEL OF CARE FORM     IDENTIFICATION  Patient Name: Hector Vazquez Birthdate: [birth year] Sex: male Admission Date (Current Location): 06/20/2022  [facility] and [place] Number:  Producer, television/film/video and Address:  [facility] [place], [address] [place], [place] [address]      Provider Number: [id number]  Attending Physician Name and Address:  Lanae Boast, MD  Relative Name and Phone Number:  [name] (friend) [phone]    Current Level of Care: Hospital Recommended Level of Care: Skilled Nursing Facility Prior Approval Number:    Date Approved/Denied:   PASRR Number: [id number]  Discharge Plan: SNF    Current Diagnoses: Patient Active Problem List   Diagnosis Date Noted   Acute on chronic systolic CHF (congestive heart failure) (HCC) 06/20/2022   Impaired ambulation 06/20/2022   CHF (congestive heart failure) (HCC) 06/20/2022    Orientation RESPIRATION BLADDER Height & Weight     Self, Time, Situation, Place  Normal Continent, External catheter (External Urinary Catheter) Weight: 192 lb 11.2 oz (87.4 kg) Height:  6\' 3"  (190.5 cm)  BEHAVIORAL SYMPTOMS/MOOD NEUROLOGICAL BOWEL NUTRITION STATUS      Continent (WDL) Diet (Please see discharge summary)  AMBULATORY STATUS COMMUNICATION OF NEEDS Skin   Limited Assist Verbally Other (Comment) (Wound/Incision LDAs,Wound/Incision open or dehiced,toe,anterior,R,Wound/Incision open or dehiced,non-pressure wound,foot,R,non-healing wound, see additional info)                       Personal Care Assistance Level of Assistance  Bathing, Feeding, Dressing Bathing Assistance: Limited assistance Feeding assistance: Independent (can feed self) Dressing Assistance: Limited assistance     Functional Limitations Info  Sight, Hearing, Speech Sight Info: Adequate (WDL) Hearing Info: Adequate (WDL) Speech Info: Adequate    SPECIAL CARE FACTORS FREQUENCY  PT (By licensed PT), OT (By  licensed OT)     PT Frequency: 5x min weekly OT Frequency: 5x min weekly            Contractures Contractures Info: Not present    Additional Factors Info  Code Status, Allergies Code Status Info: DNR Allergies Info: No Known Allergies           Current Medications (06/26/2022):  This is the current hospital active medication list Current Facility-Administered Medications  Medication Dose Route Frequency Provider Last Rate Last Admin   0.9 %  sodium chloride infusion  250 mL Intravenous PRN 14/11/2021 T, MD       acetaminophen (TYLENOL) tablet 1,000 mg  1,000 mg Oral BID PRN Mikey College, MD   1,000 mg at 06/26/22 0054   furosemide (LASIX) tablet 20 mg  20 mg Oral Daily Kc, Ramesh, MD   20 mg at 06/26/22 0843   hydrALAZINE (APRESOLINE) tablet 25 mg  25 mg Oral Q6H PRN 14/05/23 T, MD       HYDROcodone-acetaminophen (NORCO/VICODIN) 5-325 MG per tablet 1 tablet  1 tablet Oral Q6H PRN Kc, Ramesh, MD   1 tablet at 06/22/22 1440   lidocaine (LIDODERM) 5 % 1 patch  1 patch Transdermal Q24H 14/01/23 T, MD   1 patch at 06/25/22 1739   methocarbamol (ROBAXIN) tablet 500 mg  500 mg Oral Q8H PRN Kc, Ramesh, MD       metoprolol tartrate (LOPRESSOR) tablet 25 mg  25 mg Oral BID Kc, Ramesh, MD   25 mg at 06/26/22 0843   midodrine (PROAMATINE) tablet 5 mg  5 mg Oral TID WC Kc, Ramesh,  MD   5 mg at 06/26/22 1342   mupirocin ointment (BACTROBAN) 2 %   Topical BID Emeline General, MD   Given at 06/26/22 0956   ondansetron (ZOFRAN) injection 4 mg  4 mg Intravenous Q6H PRN Mikey College T, MD       potassium chloride SA (KLOR-CON M) CR tablet 20 mEq  20 mEq Oral Daily Kc, Ramesh, MD   20 mEq at 06/26/22 0843   pravastatin (PRAVACHOL) tablet 20 mg  20 mg Oral q1800 Mikey College T, MD   20 mg at 06/25/22 1739   sodium chloride flush (NS) 0.9 % injection 3 mL  3 mL Intravenous Q12H Mikey College T, MD   3 mL at 06/26/22 0850   sodium chloride flush (NS) 0.9 % injection 3 mL  3 mL Intravenous PRN Emeline General, MD   3 mL at 06/20/22 2135   Warfarin - Pharmacist Dosing Inpatient   Does not apply q1600 Doristine Counter [facility]   Given at 06/23/22 1739     Discharge Medications: Please see discharge summary for a list of discharge medications.  Relevant Imaging Results:  Relevant Lab Results:   Additional Information [id number], wound/Incision open or dehised,skin tear,elbow,L,posterior,Abrasion,arm,elbow,bilateral,ecchymosis,arm,leg,bilateral,Erythema,buttocks,Bilateral  Delilah Shan, LCSWA

## 2022-06-26 NOTE — Progress Notes (Signed)
TRH night cross cover note:   I was notified by RN of request for a sleep aid for this patient. I subsequently placed order for prn melatonin for insomnia.     Newton Pigg, DO Hospitalist

## 2022-06-26 NOTE — Progress Notes (Signed)
PROGRESS NOTE Hector Vazquez  [id number] DOB: 02/01/1930 DOA: 06/20/2022 PCP: Doreatha Lew, MD   Brief Narrative/Hospital Course:  88 y.o.m w/ history of CAD, PAF on Coumadin, HLD, CKD stage IIIb, peripheral neuropathy, peripheral edema secondary to venous insufficiency, chronic ambulatory dysfunction at baseline using cane to ambulate presented with multiple fall He has had multiple falls this year with multiple ED visits patient started to feel "my legs are so heavy" night before and next morning 06/20/22 became lightheaded standing up and fell down on his back and hit his neck and head without loss of consciousness.  Denies any preceding palpitation shortness of breath.  Claimed that his liquid more swollen in 2 months has chronic right foot ulcer care at [facility] wound care.  Lives by himself with no children and wife ED Course: Blood pressure borderline low, no tachycardia afebrile.  CT head and neck no acute findings.  WBC 10.0, K4.1, creatinine 1.6 compared to baseline 1.7, bicarb 22. Patient was found to have fluid overload, and 40 mg Lasix given in the ED Patient was admitted for near syncope and fall, orthostatic vitals ordered, also admitted with right-sided CHF significant peripheral edema given Lasix, echo ordered renal function repeated  Echo EF 40-45%, diuresed and leg edema improved overall feeling much better weight down. Seen by PT OT wound care.  Plan is for skilled nursing facility once bed available  Subjective: Seen this morning, doing well neck pain better,his friend is at the bedside.   Orthostatic vitals were negative yesterday  Assessment and Plan: Principal Problem:   CHF (congestive heart failure) (HCC) Active Problems:   Acute on chronic systolic CHF (congestive heart failure) (HCC)   Impaired ambulation  Near syncope and fall Multiple falls at home: Likely multifactorial in the setting of patient's congestive heart failure, leg edema, wound and  deconditioning.orthostatic positive placed on midodrine and compression stocking.  Metoprolol dose has been taken days.  Continue to mobilize with PT OT. TSH nl, MAL B12 on lower side added b12 supplement, recent vita d normal.  Continue PT OT and planning for SNF.  Repeat orthostatics 12/14 stable .continue midodrine  Acute systolic CHF with right-sided heart failure Acute on chronic systolic CHF EF at [id number] now Edema: BNP elevated 741 with worsening peripheral edema, chest x-ray low volume chest with mild atelectasis.Echo shows EF 40-45%, global hypokinesia, normal RV systolic function.  Echo from 2021 with EF 50-50%.  Volume status has improved, continue current low-dose Lasix 20 mg, weight has nicely downtrended.  It appears she was on torsemide and for some reason he was not taking at home. He is followed by Dr. Ola Spurr at [facility] and will need close outpatient follow-up soon. Net IO Since Admission: -7,923 mL [06/26/22 1155]  Filed Weights   06/20/22 1008 06/24/22 0513 06/25/22 0635  Weight: 99.8 kg 85.9 kg 87.4 kg   Acute hypoxic respiratory failure : Resolved doing well on room air  Troponin 27-28 flat likely demand ischemia from CHF to CHF.  He never had chest pain. Hypokalemia: Resolved. Hypertension: BP mostly soft, currently stable continue current midodrine and metoprolol  PAF on Coumadin: Metoprolol dose decreased,Off Cardizem due to soft BP. INR being monitored by pharmacy as below Recent Labs  Lab 06/22/22 0238 06/23/22 0131 06/24/22 0111 06/25/22 0544 06/26/22 0712  INR 3.2* 3.2* 2.9* 1.9* 1.8*   Elevated intact PTH 110, a month ago CKD stage IIIb: Baseline creatinine 1.6-1.8 in careeverywhere  since 2022> now improved to 1.3.Monitor Recent Labs  06/20/22 0940 06/21/22 3154 06/22/22 0238 06/23/22 0131 06/24/22 0136 06/25/22 0544 06/26/22 0712  BUN 30* 28* 32* 27* 30* 28* 28*  CREATININE 1.66* 1.84* 1.78* 1.47* 1.44* 1.49* 1.37*   Chronic right leg  wound:cont wound care.Appreciate input. Neck Pain -musculoskeletal, he attributes to neck collar in ambulance : X-ray shows multilevel degenerative disc disease and facet arthropathy. Pain is better. Cont Robaxin Norco, lidocaine patch.  DVT prophylaxis: Place TED hose Start: 06/23/22 1512coumadin Code Status:   Code Status: DNR Family Communication: plan of care discussed with patient at bedside. Updated his friend- he has no family  Patient status is: Inpatient because of congestive failure management Level of care: Telemetry Medical   Dispo: The patient is from: Home lives alone            Anticipated disposition: SNF to [facility] burn once available  Objective: Vitals last 24 hrs: Vitals:   06/25/22 2128 06/26/22 0447 06/26/22 0843 06/26/22 0900  BP: 98/61 120/74 101/63 101/63  Pulse: 61 67 88 (!) 59  Resp:  17 (!) 22 20  Temp:  (!) 97.2 F (36.2 C) (!) 96.5 F (35.8 C) (!) 97.5 F (36.4 C)  TempSrc:  Oral Oral Oral  SpO2:  96%  98%  Weight:      Height:       Weight change:   Physical Examination: General exam: AAOX3, weak,older appearing HEENT:Oral mucosa moist, Ear/Nose WNL grossly, dentition normal. Respiratory system: bilaterally CLEAR BS, no use of accessory muscle Cardiovascular system: S1 & S2 +, regular rate, JVD neg. Gastrointestinal system: Abdomen soft, NT,ND,BS+ Nervous System:Alert, awake, moving extremities and grossly nonfocal Extremities: LE ankle edema neg, lower extremities warm Skin: No rashes,no icterus. MSK: Normal muscle bulk,tone, power   Medications reviewed:  Scheduled Meds:  furosemide  20 mg Oral Daily   lidocaine  1 patch Transdermal Q24H   metoprolol tartrate  25 mg Oral BID   midodrine  5 mg Oral TID WC   mupirocin ointment   Topical BID   potassium chloride  20 mEq Oral Daily   pravastatin  20 mg Oral q1800   sodium chloride flush  3 mL Intravenous Q12H   Warfarin - Pharmacist Dosing Inpatient   Does not apply q1600  Continuous  Infusions:  sodium chloride      Diet Order             Diet Heart Room service appropriate? Yes; Fluid consistency: Thin; Fluid restriction: 2000 mL Fluid  Diet effective now                  Intake/Output Summary (Last 24 hours) at 06/26/2022 1155 Last data filed at 06/26/2022 0533 Gross per 24 hour  Intake 490 ml  Output 1600 ml  Net -1110 ml    Net IO Since Admission: -7,923 mL [06/26/22 1155]  Wt Readings from Last 3 Encounters:  06/25/22 87.4 kg  12/19/21 97 kg  12/18/21 97.5 kg     Unresulted Labs (From admission, onward)     Start     Ordered   06/25/22 0500  Basic metabolic panel  Daily at 5am,   R     Question:  Specimen collection method  Answer:  Lab=Lab collect   06/24/22 0622   06/21/22 0500  Protime-INR  Daily at 5am,   R      06/20/22 1610          Data Reviewed: I have personally reviewed following labs and imaging studies CBC: Recent  Labs  Lab 06/20/22 0940 06/21/22 0619 06/22/22 0238 06/23/22 0131 06/25/22 0544  WBC 10.0 11.2* 10.5 9.3 8.5  NEUTROABS 7.8*  --   --   --   --   HGB 12.5* 12.7* 11.8* 12.4* 12.5*  HCT 37.2* 39.4 35.0* 38.2* 36.5*  MCV 103.6* 103.7* 100.3* 101.9* 99.7  PLT 266 287 254 255 [id number]    Basic Metabolic Panel: Recent Labs  Lab 06/21/22 0619 06/22/22 0238 06/22/22 0531 06/23/22 0131 06/24/22 0136 06/25/22 0544 06/26/22 0712  NA 141 141  --  137 137 137 136  K 3.7 3.2*  --  3.6 3.8 4.2 4.0  CL 102 100  --  104 104 105 106  CO2 26 28  --  25 24 24 25   GLUCOSE 105* 119*  --  109* 106* 105* 92  BUN 28* 32*  --  27* 30* 28* 28*  CREATININE 1.84* 1.78*  --  1.47* 1.44* 1.49* 1.37*  CALCIUM 8.9 8.6*  --  8.4* 8.4* 8.2* 8.3*  MG 2.2  --  2.0  --   --   --   --   PHOS 3.1  --   --   --   --   --   --    [id number] Creatinine Clearance: 42.8 mL/min (A) (by C-G formula based on SCr of 1.37 mg/dL (H)).Liver Function Tests: Recent Labs  Lab 06/20/22 0940  AST 27  ALT 17  ALKPHOS 30*  BILITOT 0.9  PROT  6.0*  ALBUMIN 3.5   Culture/Microbiology No results found for: "SDES", "SPECREQUEST", "CULT", "REPTSTATUS"  Other culture-see note  Radiology Studies: No results found.  LOS: 6 days  Antonieta Pert, MD [facility]  06/26/2022, 11:55 AM

## 2022-06-26 NOTE — Progress Notes (Signed)
ANTICOAGULATION CONSULT NOTE  Pharmacy Consult for Warfarin Indication: atrial fibrillation  No Known Allergies  Patient Measurements: Height: 6\' 3"  (190.5 cm) Weight: 87.4 kg (192 lb 11.2 oz) IBW/kg (Calculated) : 84.5  Vital Signs: Temp: 97.2 F (36.2 C) (12/05 0447) Temp Source: Oral (12/05 0447) BP: 120/74 (12/05 0447) Pulse Rate: 67 (12/05 0447)  Labs: Recent Labs    06/24/22 0111 06/24/22 0136 06/25/22 0544 06/26/22 0712  HGB  --   --  12.5*  --   HCT  --   --  36.5*  --   PLT  --   --  286  --   LABPROT 30.2*  --  21.5* 20.5*  INR 2.9*  --  1.9* 1.8*  CREATININE  --  1.44* 1.49* 1.37*     Estimated Creatinine Clearance: 42.8 mL/min (A) (by C-G formula based on SCr of 1.37 mg/dL (H)).  Assessment: 88 yo M presented to ED as a level 2 trauma s/p a mechanical fall, followed by a second fall secondarty to dizziness and weakness on 06/20/22. Abrasions noted to posterior head and bilateral elbows and no active bleeding noted on presentation to ED. Pt has hx atrial fibrillation and on Warfarin PTA, which has been continued per Pharmacy dosing.  INR is subtherapeutic at 1.8 CBC stable. No s/sx of bleeding.   PTA regimen: 2.5 mg daily except 1.25 mg on Thursdays.  Goal of Therapy:  INR 2-3 Monitor platelets by anticoagulation protocol: Yes   Plan:  Warfarin 3 mg tonight  Daily PT/INR  12-23-1979 PharmD., BCPS Clinical Pharmacist 06/26/2022 8:22 AM

## 2022-06-26 NOTE — Progress Notes (Signed)
Assumed patient care at 0730hrs and handed off patient at 1015hrs, report given to [facility] RN.

## 2022-06-27 DIAGNOSIS — I5021 Acute systolic (congestive) heart failure: Secondary | ICD-10-CM | POA: Diagnosis not present

## 2022-06-27 DIAGNOSIS — R262 Difficulty in walking, not elsewhere classified: Secondary | ICD-10-CM | POA: Diagnosis not present

## 2022-06-27 LAB — PROTIME-INR
INR: 2.1 — ABNORMAL HIGH (ref 0.8–1.2)
Prothrombin Time: 23.2 seconds — ABNORMAL HIGH (ref 11.4–15.2)

## 2022-06-27 LAB — BASIC METABOLIC PANEL
Anion gap: 8 (ref 5–15)
BUN: 27 mg/dL — ABNORMAL HIGH (ref 8–23)
CO2: 25 mmol/L (ref 22–32)
Calcium: 8.3 mg/dL — ABNORMAL LOW (ref 8.9–10.3)
Chloride: 100 mmol/L (ref 98–111)
Creatinine, Ser: 1.61 mg/dL — ABNORMAL HIGH (ref 0.61–1.24)
GFR, Estimated: 40 mL/min — ABNORMAL LOW (ref >60)
Glucose, Bld: 98 mg/dL (ref 70–99)
Potassium: 4.1 mmol/L (ref 3.5–5.1)
Sodium: 133 mmol/L — ABNORMAL LOW (ref 135–145)

## 2022-06-27 MED ORDER — WARFARIN SODIUM 2.5 MG PO TABS
2.5000 mg | ORAL_TABLET | Freq: Once | ORAL | Status: AC
  Administered 2022-06-27: 2.5 mg via ORAL
  Filled 2022-06-27: qty 1

## 2022-06-27 NOTE — Progress Notes (Signed)
  Mobility Specialist Progress Note  06/27/22 1552  Mobility  Activity Transferred to/from [facility]  Level of Assistance +2 (takes two people)  Games developer wheel walker  Activity Response Tolerated well  Mobility Referral Yes  $Mobility charge 1 Mobility   Pt received on BSC need assistance back to bed. Pt was a +2 to stand and ModA to pivot back to bed. Pt was left with all needs met and NT present.  [name]  Mobility Specialist Please contact via Solicitor or Rehab office at [phone]

## 2022-06-27 NOTE — TOC Progression Note (Addendum)
Transition of Care [facility]) - Progression Note    Patient Details  Name: Hector Vazquez MRN: [id number] Date of Birth: [birth year]  Transition of Care [facility]) CM/SW Contact  Delilah Shan, LCSWA Phone Number: 06/27/2022, 12:11 PM  Clinical Narrative:     Patients insurance authorization is currently pending for SNF placement. CSW updated patient and patients friend [name].Patient has SN[facility] bed at [facility]. CSW will continue to follow and assist with patients dc planning needs.  Expected Discharge Plan:  (Home interested in hh services) Barriers to Discharge: Continued Medical Work up  Expected Discharge Plan and Services Expected Discharge Plan:  (Home interested in hh services) In-house Referral: Clinical Social Work     Living arrangements for the past 2 months: Single Family Home                                       Social Determinants of Health (SDOH) Interventions    Readmission Risk Interventions     No data to display

## 2022-06-27 NOTE — Progress Notes (Signed)
ANTICOAGULATION CONSULT NOTE  Pharmacy Consult for Warfarin Indication: atrial fibrillation  No Known Allergies  Patient Measurements: Height: 6\' 3"  (190.5 cm) Weight: 87.4 kg (192 lb 11.2 oz) IBW/kg (Calculated) : 84.5  Vital Signs: Temp: 97.8 F (36.6 C) (12/06 0759) Temp Source: Oral (12/06 0759) BP: 111/62 (12/06 0907) Pulse Rate: 94 (12/06 0907)  Labs: Recent Labs    06/25/22 0544 06/26/22 0712 06/27/22 0616  HGB 12.5*  --   --   HCT 36.5*  --   --   PLT 286  --   --   LABPROT 21.5* 20.5* 23.2*  INR 1.9* 1.8* 2.1*  CREATININE 1.49* 1.37* 1.61*     Estimated Creatinine Clearance: 36.4 mL/min (A) (by C-G formula based on SCr of 1.61 mg/dL (H)).  Assessment: 88 yo M presented to ED as a level 2 trauma s/p a mechanical fall, followed by a second fall secondarty to dizziness and weakness on 06/20/22. Abrasions noted to posterior head and bilateral elbows and no active bleeding noted on presentation to ED. Pt has hx atrial fibrillation and on Warfarin PTA, which has been continued per Pharmacy dosing.  INR is therapeutic at 2.1. CBC stable on last check. No s/sx of bleeding.   PTA regimen: 2.5 mg daily except 1.25 mg on Thursdays.  Goal of Therapy:  INR 2-3 Monitor platelets by anticoagulation protocol: Yes   Plan:  Warfarin 2.5 mg tonight as per PTA regimen Daily PT/INR  12-23-1979, PharmD, BCCCP Clinical Pharmacist  Phone: [phone] 06/27/2022 10:58 AM  Please check AMION for all [facility] Pharmacy phone numbers After 10:00 PM, call Main Pharmacy [phone]

## 2022-06-27 NOTE — Progress Notes (Signed)
[facility] PROGRESS NOTE  Mcgregor Schnoor (DOB: Mar 24, 1930) VZD:[id number] PCP: Randel Pigg, Dorma Russell, MD  Brief Narrative: 88 y.o.m w/ history of CAD, PAF on Coumadin, HLD, CKD stage IIIb, peripheral neuropathy, peripheral edema secondary to venous insufficiency, chronic ambulatory dysfunction at baseline using cane to ambulate presented with multiple fall He has had multiple falls this year with multiple ED visits patient started to feel "my legs are so heavy" night before and next morning 06/20/22 became lightheaded standing up and fell down on his back and hit his neck and head without loss of consciousness.  Denies any preceding palpitation shortness of breath.  Claimed that his liquid more swollen in 2 months has chronic right foot ulcer care at [facility] wound care.  Lives by himself with no children and wife ED Course: Blood pressure borderline low, no tachycardia afebrile.  CT head and neck no acute findings.  WBC 10.0, K4.1, creatinine 1.6 compared to baseline 1.7, bicarb 22. Patient was found to have fluid overload, and 40 mg Lasix given in the ED Patient was admitted for near syncope and fall, orthostatic vitals ordered, also admitted with right-sided CHF significant peripheral edema given Lasix, echo ordered renal function repeated  Echo EF 40-45%, diuresed and leg edema improved overall feeling much better weight down. Seen by PT OT wound care.  Plan is for skilled nursing facility for rehabilitation. He is medically stable for discharge, but delayed waiting on insurance authorization.  Subjective: Feels diffusely weak, but regaining strength slowly as he gets OOB more. No dizziness, chest pain or shortness of breath. Neck pain stable, no weakness or numbness in arms.  Objective: BP (!) 95/53 (BP Location: Right Arm)   Pulse 67   Temp 98 F (36.7 C) (Oral)   Resp 18   Ht 6\' 3"  (1.905 m)   Wt 87.4 kg   SpO2 99%   BMI 24.09 kg/m   Gen: No distress, elderly Pulm: Clear,  nonlabored  CV: RRR, no MRG. Trace pitting edema. GI: Soft, NT, ND, +BS  Neuro: Alert and oriented. No new focal deficits.   Assessment & Plan: Near syncope and fall Multiple falls at home: Likely multifactorial in the setting of patient's congestive heart failure, leg edema, wound and deconditioning.orthostatic positive placed on midodrine and compression stocking.   - Continue to mobilize with PT OT. TSH nl, MAL B12 - Continue B12 supplement.  - Continue PT OT and planning for SNF.   - Repeat orthostatics 12/14 stable .continue midodrine   Acute on chronic HFrEF. LVEF at 40-45%, global hypokinesis.  BNP elevated 741 with worsening peripheral edema, chest x-ray low volume chest with mild atelectasis.  - Volume status has improved, creatinine bumped 12/6, will hold lasix 20 mg starting 12/7. - Follow up with cardiologist, Dr. 14/7 after discharge.  - Further GDMT is limited by soft BP on midodrine. Continue metoprolol, can consolidate to succinate at discharge.    Acute hypoxic respiratory failure : Resolved doing well on room air   Troponin 27-28 flat likely demand ischemia from CHF to CHF.  He never had chest pain. and no regional WMA on echo.  - Follow up with Dr. 10-20-2001.  Hypokalemia: Resolved. - Continue standing supplement  Hypertension: BP mostly soft, currently stable continue current midodrine and metoprolol   PAF on Coumadin: Metoprolol dose decreased,Off Cardizem due to soft BP. INR being monitored by pharmacy as below  Elevated intact PTH 110, a month ago  CKD stage IIIb: Baseline creatinine 1.6-1.8 in careeverywhere  since 2022> now improved to 1.3.Monitor  Chronic right leg wound: cont wound care.Appreciate input.  Neck Pain -musculoskeletal, he attributes to neck collar in ambulance : X-ray shows multilevel degenerative disc disease and facet arthropathy. Pain is better. Cont Robaxin Norco, lidocaine patch.  Tyrone Nine, MD [facility] [email or web address] 06/27/2022, 4:45 PM

## 2022-06-28 DIAGNOSIS — R262 Difficulty in walking, not elsewhere classified: Secondary | ICD-10-CM | POA: Diagnosis not present

## 2022-06-28 DIAGNOSIS — I5021 Acute systolic (congestive) heart failure: Secondary | ICD-10-CM | POA: Diagnosis not present

## 2022-06-28 LAB — PROTIME-INR
INR: 2.5 — ABNORMAL HIGH (ref 0.8–1.2)
Prothrombin Time: 26.9 seconds — ABNORMAL HIGH (ref 11.4–15.2)

## 2022-06-28 MED ORDER — WARFARIN 1.25 MG HALF TABLET
1.2500 mg | ORAL_TABLET | Freq: Once | ORAL | Status: AC
  Administered 2022-06-28: 1.25 mg via ORAL
  Filled 2022-06-28: qty 1

## 2022-06-28 MED ORDER — NITROGLYCERIN 0.4 MG SL SUBL
0.4000 mg | SUBLINGUAL_TABLET | Freq: Once | SUBLINGUAL | Status: DC
  Filled 2022-06-28: qty 1

## 2022-06-28 NOTE — Care Management Important Message (Signed)
Important Message  Patient Details  Name: Hector Vazquez MRN: [id number] Date of Birth: 12-05-1929   Medicare Important Message Given:  Yes     Renie Ora 06/28/2022, 1:02 PM

## 2022-06-28 NOTE — Progress Notes (Signed)
Patient co of chest pain, midsternal.  EKG and Vitals obtained.  Dr Jarvis Newcomer notified.  Order received to give one nitro and get a troponin.

## 2022-06-28 NOTE — Progress Notes (Signed)
[facility] PROGRESS NOTE  Hector Vazquez (DOB: 08-Sep-1929) [id number] PCP: Randel Pigg, Dorma Russell, MD  Brief Narrative: 88 y.o.m w/ history of CAD, PAF on Coumadin, HLD, CKD stage IIIb, peripheral neuropathy, peripheral edema secondary to venous insufficiency, chronic ambulatory dysfunction at baseline using cane to ambulate presented with multiple fall He has had multiple falls this year with multiple ED visits patient started to feel "my legs are so heavy" night before and next morning 06/20/22 became lightheaded standing up and fell down on his back and hit his neck and head without loss of consciousness.  Denies any preceding palpitation shortness of breath.  Claimed that his liquid more swollen in 2 months has chronic right foot ulcer care at [facility] wound care.  Lives by himself with no children and wife ED Course: Blood pressure borderline low, no tachycardia afebrile.  CT head and neck no acute findings.  WBC 10.0, K4.1, creatinine 1.6 compared to baseline 1.7, bicarb 22. Patient was found to have fluid overload, and 40 mg Lasix given in the ED Patient was admitted for near syncope and fall, orthostatic vitals ordered, also admitted with right-sided CHF significant peripheral edema given Lasix, echo ordered renal function repeated  Echo EF 40-45%, diuresed and leg edema improved overall feeling much better weight down. Seen by PT OT wound care.  Plan is for skilled nursing facility for rehabilitation. He is medically stable for discharge, but delayed waiting on insurance authorization.  Subjective: No new complaints, eager to get to SNF.  Objective: BP (!) 86/68   Pulse 89   Temp (!) 97.5 F (36.4 C) (Oral)   Resp 17   Ht 6\' 3"  (1.905 m)   Wt 81.8 kg   SpO2 100%   BMI 22.54 kg/m   Gen: Elderly pleasant male in no distress Pulm: Clear, nonlabored CV: RRR, no MRG or pitting edema GI: +BS, soft, NT, ND  Assessment & Plan: Near syncope and fall Multiple falls at  home: Likely multifactorial in the setting of patient's congestive heart failure, leg edema, wound and deconditioning.orthostatic positive placed on midodrine and compression stocking.   - Continue to mobilize with PT OT. TSH nl, MAL B12 - Continue B12 supplement.  - Continue PT OT and planning for SNF.   - Repeat orthostatics 12/14 stable .continue midodrine   Acute on chronic HFrEF. LVEF at 40-45%, global hypokinesis.  BNP elevated 741 with worsening peripheral edema, chest x-ray low volume chest with mild atelectasis.  - Volume status has improved, creatinine bumped 12/6, will hold lasix 20 mg starting 12/7. - Follow up with cardiologist, Dr. 14/7 after discharge.  - Further GDMT is limited by soft BP on midodrine. Continue metoprolol, can consolidate to succinate at discharge.    Acute hypoxic respiratory failure : Resolved doing well on room air   Troponin 27-28 flat likely demand ischemia from CHF to CHF.  He never had chest pain. and no regional WMA on echo.  - Follow up with Dr. 10-20-2001.  Hypokalemia: Resolved. - Continue standing supplement  Hypertension: BP mostly soft, currently stable continue current midodrine and metoprolol   PAF on Coumadin: Metoprolol dose decreased,Off Cardizem due to soft BP. INR being monitored by pharmacy as below  Elevated intact PTH 110, a month ago  CKD stage IIIb: Baseline creatinine 1.6-1.8 in careeverywhere  since 2022> now improved to 1.3. Recheck in the next week, will order for AM if stays here.  Chronic right leg wound: cont wound care. Appreciate input.  Neck Pain: musculoskeletal,  he attributes to neck collar in ambulance : X-ray shows multilevel degenerative disc disease and facet arthropathy. Pain is better. Cont Robaxin Norco, lidocaine patch. No meningismus.   Tyrone Nine, MD [facility] [email or web address] 06/28/2022, 1:13 PM

## 2022-06-28 NOTE — Progress Notes (Signed)
Upon returning to room to give nitro, patient is now stating it is not chest pain but back pain.  Spoke to Dr. Jarvis Newcomer, he states to cancel troponin and not give nitro

## 2022-06-28 NOTE — TOC Progression Note (Signed)
Transition of Care [facility]) - Progression Note    Patient Details  Name: Hector Vazquez MRN: [id number] Date of Birth: 1929-08-24  Transition of Care [facility]) CM/SW Contact  Delilah Shan, LCSWA Phone Number: 06/28/2022, 11:24 AM  Clinical Narrative:     CSW spoke with [place] from [place] who confirmed patients insurance authorization is still pending. CSW informed MD. CSW will continue to follow and assist with patients dc planning needs.  Expected Discharge Plan:  (Home interested in hh services) Barriers to Discharge: Continued Medical Work up  Expected Discharge Plan and Services Expected Discharge Plan:  (Home interested in hh services) In-house Referral: Clinical Social Work     Living arrangements for the past 2 months: Single Family Home Expected Discharge Date: 06/28/22                                     Social Determinants of Health (SDOH) Interventions    Readmission Risk Interventions     No data to display

## 2022-06-28 NOTE — Progress Notes (Signed)
ANTICOAGULATION CONSULT NOTE  Pharmacy Consult for Warfarin Indication: atrial fibrillation  No Known Allergies  Patient Measurements: Height: 6\' 3"  (190.5 cm) Weight: 81.8 kg (180 lb 4.8 oz) IBW/kg (Calculated) : 84.5  Vital Signs: Temp: 97.5 F (36.4 C) (12/07 0749) Temp Source: Oral (12/07 0749) BP: 105/58 (12/07 0749) Pulse Rate: 60 (12/07 0749)  Labs: Recent Labs    06/26/22 0712 06/27/22 0616 06/28/22 0228  LABPROT 20.5* 23.2* 26.9*  INR 1.8* 2.1* 2.5*  CREATININE 1.37* 1.61*  --      Estimated Creatinine Clearance: 35.3 mL/min (A) (by C-G formula based on SCr of 1.61 mg/dL (H)).  Assessment: 88 yo M presented to ED as a level 2 trauma s/p a mechanical fall, followed by a second fall secondarty to dizziness and weakness on 06/20/22. Abrasions noted to posterior head and bilateral elbows and no active bleeding noted on presentation to ED. Pt has hx atrial fibrillation and on Warfarin PTA, which has been continued per Pharmacy dosing.  INR is therapeutic at 2.5 with trend up.   PTA regimen: 2.5 mg daily except 1.25 mg on Thursdays.  Goal of Therapy:  INR 2-3 Monitor platelets by anticoagulation protocol: Yes   Plan:  Warfarin 1.25 mg tonight as per PTA regimen Daily PT/INR  12-23-1979, PharmD Clinical Pharmacist **Pharmacist phone directory can now be found on [email or web address] ([email or web address]).  Listed under [facility].

## 2022-06-29 DIAGNOSIS — I5021 Acute systolic (congestive) heart failure: Secondary | ICD-10-CM | POA: Diagnosis not present

## 2022-06-29 DIAGNOSIS — R262 Difficulty in walking, not elsewhere classified: Secondary | ICD-10-CM | POA: Diagnosis not present

## 2022-06-29 LAB — BASIC METABOLIC PANEL
Anion gap: 9 (ref 5–15)
BUN: 32 mg/dL — ABNORMAL HIGH (ref 8–23)
CO2: 22 mmol/L (ref 22–32)
Calcium: 8.5 mg/dL — ABNORMAL LOW (ref 8.9–10.3)
Chloride: 105 mmol/L (ref 98–111)
Creatinine, Ser: 1.64 mg/dL — ABNORMAL HIGH (ref 0.61–1.24)
GFR, Estimated: 39 mL/min — ABNORMAL LOW (ref >60)
Glucose, Bld: 99 mg/dL (ref 70–99)
Potassium: 4.3 mmol/L (ref 3.5–5.1)
Sodium: 136 mmol/L (ref 135–145)

## 2022-06-29 LAB — PROTIME-INR
INR: 2.5 — ABNORMAL HIGH (ref 0.8–1.2)
Prothrombin Time: 26.5 seconds — ABNORMAL HIGH (ref 11.4–15.2)

## 2022-06-29 MED ORDER — WARFARIN SODIUM 2.5 MG PO TABS
2.5000 mg | ORAL_TABLET | ORAL | Status: DC
  Administered 2022-06-29 – 2022-07-01 (×3): 2.5 mg via ORAL
  Filled 2022-06-29 (×3): qty 1

## 2022-06-29 MED ORDER — WARFARIN 1.25 MG HALF TABLET: 1.2500 mg | ORAL_TABLET | ORAL | Status: DC

## 2022-06-29 NOTE — Progress Notes (Signed)
Occupational Therapy Treatment Patient Details Name: Hector Vazquez MRN: [id number] DOB: 1930/06/20 Today's Date: 06/29/2022   History of present illness Hector Vazquez is a 88 y.o. male with medical history significant of CAD, PAF on Coumadin, HLD, CKD stage IIIb, peripheral neuropathy, peripheral edema secondary to venous insufficiency, came with frequent falls.   OT comments  Pt eager to get to rehab. Does better with ambulation with shoes donned. Stood from elevated surface with min assist, ambulated with min assist to use BSC. Pt needing some assistance for posterior pericare. Completed seated grooming with set up . HR to 136 with activity.    Recommendations for follow up therapy are one component of a multi-disciplinary discharge planning process, led by the attending physician.  Recommendations may be updated based on patient status, additional functional criteria and insurance authorization.    Follow Up Recommendations  Skilled nursing-short term rehab (<3 hours/day)     Assistance Recommended at Discharge Frequent or constant Supervision/Assistance  Patient can return home with the following  A little help with walking and/or transfers;A little help with bathing/dressing/bathroom;Assistance with cooking/housework;Direct supervision/assist for medications management;Direct supervision/assist for financial management;Assist for transportation;Help with stairs or ramp for entrance   Equipment Recommendations  Other (comment) (defer to next venue)    Recommendations for Other Services      Precautions / Restrictions Precautions Precautions: Fall Other Brace: wears heel cups in his shoes due to heel pain; R foot with ulcer under first met head Restrictions Weight Bearing Restrictions: No       Mobility Bed Mobility Overal bed mobility: Needs Assistance Bed Mobility: Supine to Sit     Supine to sit: Supervision          Transfers Overall transfer level: Needs  assistance Equipment used: Rolling walker (2 wheels) Transfers: Sit to/from Stand Sit to Stand: Min assist           General transfer comment: repeated cues for hand placement, elevated bed     Balance Overall balance assessment: History of Falls, Needs assistance   Sitting balance-Leahy Scale: Fair     Standing balance support: Bilateral upper extremity supported, During functional activity Standing balance-Leahy Scale: Poor                             ADL either performed or assessed with clinical judgement   ADL Overall ADL's : Needs assistance/impaired     Grooming: Oral care;Wash/dry hands;Wash/dry face;Brushing hair;Sitting;Supervision/safety                   Toilet Transfer: Minimal assistance;Ambulation;BSC/3in1;Rolling walker (2 wheels)   Toileting- Clothing Manipulation and Hygiene: Sitting/lateral lean;Moderate assistance              Extremity/Trunk Assessment              Vision       Perception     Praxis      Cognition Arousal/Alertness: Awake/alert Behavior During Therapy: WFL for tasks assessed/performed Overall Cognitive Status: Impaired/Different from baseline Area of Impairment: Following commands, Memory                     Memory: Decreased short-term memory Following Commands: Follows one step commands with increased time (multimodal cues at times)                Exercises      Shoulder Instructions       General Comments  Pertinent Vitals/ Pain       Pain Assessment Pain Assessment: No/denies pain  Home Living                                          Prior Functioning/Environment              Frequency  Min 2X/week        Progress Toward Goals  OT Goals(current goals can now be found in the care plan section)  Progress towards OT goals: Progressing toward goals  Acute Rehab OT Goals OT Goal Formulation: With patient Time For Goal  Achievement: 07/06/22 Potential to Achieve Goals: Good  Plan Discharge plan remains appropriate;Frequency remains appropriate    Co-evaluation                 AM-PAC OT "6 Clicks" Daily Activity     Outcome Measure   Help from another person eating meals?: None Help from another person taking care of personal grooming?: A Little Help from another person toileting, which includes using toliet, bedpan, or urinal?: A Lot Help from another person bathing (including washing, rinsing, drying)?: A Lot Help from another person to put on and taking off regular upper body clothing?: A Little Help from another person to put on and taking off regular lower body clothing?: A Lot 6 Click Score: 16    End of Session Equipment Utilized During Treatment: Gait belt;Rolling walker (2 wheels)  OT Visit Diagnosis: Unsteadiness on feet (R26.81);Muscle weakness (generalized) (M62.81);Other abnormalities of gait and mobility (R26.89);Other symptoms and signs involving cognitive function;Pain   Activity Tolerance Patient tolerated treatment well   Patient Left in bed;with call bell/phone within reach;with bed alarm set   Nurse Communication          Time: [phone] OT Time Calculation (min): 43 min  Charges: OT General Charges $OT Visit: 1 Visit OT Treatments $Self Care/Home Management : 38-52 mins  Cleta Alberts, OTR/L Acute Rehabilitation Services Office: [phone]   Malka So 06/29/2022, 11:01 AM

## 2022-06-29 NOTE — Progress Notes (Signed)
Physical Therapy Treatment Patient Details Name: Hector Vazquez MRN: [id number] DOB: 09-01-1929 Today's Date: 06/29/2022   History of Present Illness 88 y.o. male with medical history significant of CAD, PAF on Coumadin, HLD, CKD stage IIIb, peripheral neuropathy, peripheral edema secondary to venous insufficiency, came with frequent falls.    PT Comments    Pt sitting up in recliner finishing lunch on entry. Eager to get up and work with therapy. Pt is min guard for transfer to RW. Pt refuses walking in hallway, however agreeable to walking in room. Pt requires minA and constant cuing for proximity to RW. Pt reports eagerness to go to [place] to return to independence. Plan for transfer to [facility] Monday.     Recommendations for follow up therapy are one component of a multi-disciplinary discharge planning process, led by the attending physician.  Recommendations may be updated based on patient status, additional functional criteria and insurance authorization.  Follow Up Recommendations  Skilled nursing-short term rehab (<3 hours/day) Can patient physically be transported by private vehicle: No   Assistance Recommended at Discharge Frequent or constant Supervision/Assistance  Patient can return home with the following A lot of help with walking and/or transfers;A lot of help with bathing/dressing/bathroom;Assistance with cooking/housework;Direct supervision/assist for medications management;Direct supervision/assist for financial management;Assist for transportation;Help with stairs or ramp for entrance   Equipment Recommendations  None recommended by PT       Precautions / Restrictions Precautions Precautions: Fall Precaution Comments: recent falls Other Brace: wears heel cups in his shoes due to heel pain; R foot with ulcer under first met head Restrictions Weight Bearing Restrictions: No     Mobility  Bed Mobility               General bed mobility comments: up in  recliner    Transfers Overall transfer level: Needs assistance Equipment used: Rolling walker (2 wheels) Transfers: Sit to/from Stand Sit to Stand: Min guard           General transfer comment: min guard for safety, increased time and effort to power up with B UE from recliner and reach up to RW    Ambulation/Gait Ambulation/Gait assistance: Min assist Gait Distance (Feet): 30 Feet Assistive device: Rolling walker (2 wheels) Gait Pattern/deviations: Step-to pattern, Decreased stride length, Wide base of support Gait velocity: reduced Gait velocity interpretation: <1.31 ft/sec, indicative of household ambulator   General Gait Details: requests to have shoes on for ambulation, able to don with minA, able to ambulate with light min A for safety, constant cuing for proximity to RW to keep from tripping         Balance Overall balance assessment: History of Falls, Needs assistance Sitting-balance support: Feet supported Sitting balance-Leahy Scale: Fair   Postural control: Posterior lean Standing balance support: Bilateral upper extremity supported, During functional activity Standing balance-Leahy Scale: Poor Standing balance comment: able to self steady at [employer] Arousal/Alertness: Awake/alert Behavior During Therapy: Agitated, Anxious Overall Cognitive Status: Impaired/Different from baseline Area of Impairment: Following commands, Memory                     Memory: Decreased short-term memory Following Commands: Follows one step commands with increased time       General Comments: follows directions however quickly forgets and needs recuing  General Comments General comments (skin integrity, edema, etc.): HR 80s at rest, max noted 125bpm with ambulation, SpO2 on RA >90%O2      Pertinent Vitals/Pain Pain Assessment Pain Assessment: No/denies pain     PT Goals (current goals can now be found  in the care plan section) Acute Rehab PT Goals Patient Stated Goal: agreeable to short term rehab; hopeful to return to independent PT Goal Formulation: With patient Time For Goal Achievement: 07/05/22 Potential to Achieve Goals: Fair Progress towards PT goals: Progressing toward goals    Frequency    Min 3X/week      PT Plan Current plan remains appropriate       AM-PAC PT "6 Clicks" Mobility   Outcome Measure  Help needed turning from your back to your side while in a flat bed without using bedrails?: A Little Help needed moving from lying on your back to sitting on the side of a flat bed without using bedrails?: A Lot Help needed moving to and from a bed to a chair (including a wheelchair)?: A Lot Help needed standing up from a chair using your arms (e.g., wheelchair or bedside chair)?: A Little Help needed to walk in hospital room?: A Little Help needed climbing 3-5 steps with a railing? : Total 6 Click Score: 14    End of Session Equipment Utilized During Treatment: Gait belt Activity Tolerance: Patient tolerated treatment well Patient left: with call bell/phone within reach;in chair;with chair alarm set Nurse Communication: Mobility status PT Visit Diagnosis: Other abnormalities of gait and mobility (R26.89);Repeated falls (R29.6);History of falling (Z91.81)     Time: [id number] PT Time Calculation (min) (ACUTE ONLY): 22 min  Charges:  $Gait Training: 8-22 mins                     Beverley Sherrard B. Migdalia Dk PT, DPT Acute Rehabilitation Services Please use secure chat or  Call Office [phone]    [place] 06/29/2022, 2:45 PM

## 2022-06-29 NOTE — TOC Progression Note (Addendum)
Transition of Care [facility]) - Progression Note    Patient Details  Name: Hector Vazquez MRN: [id number] Date of Birth: [birth year]  Transition of Care [facility]) CM/SW Contact  Delilah Shan, LCSWA Phone Number: 06/29/2022, 10:46 AM  Clinical Narrative:     CSW was informed by [place] with [facility] that patients insurance authorization was approved. [place] informed CSW that  they currently do not have a SNF bed available today. [place] bed was needed last night at facility, currently bed not available today. [place] informed CSW SNF Bed will be available on Monday. CSW informed Brayton Caves with [facility] leadership. CSW informed MD. CSW will update patient and patients friend [name].CSW will continue to follow and assist with patients dc planning needs.  Expected Discharge Plan:  (Home interested in hh services) Barriers to Discharge: Continued Medical Work up  Expected Discharge Plan and Services Expected Discharge Plan:  (Home interested in hh services) In-house Referral: Clinical Social Work     Living arrangements for the past 2 months: Single Family Home Expected Discharge Date: 06/29/22                                     Social Determinants of Health (SDOH) Interventions    Readmission Risk Interventions     No data to display

## 2022-06-29 NOTE — Progress Notes (Signed)
[facility] PROGRESS NOTE  Kasim Wirthlin (DOB: 06/15/1930) [id number]:[id number] PCP: Randel Pigg, Dorma Russell, MD  Brief Narrative: 88 y.o.m w/ history of CAD, PAF on Coumadin, HLD, CKD stage IIIb, peripheral neuropathy, peripheral edema secondary to venous insufficiency, chronic ambulatory dysfunction at baseline using cane to ambulate presented with multiple fall He has had multiple falls this year with multiple ED visits patient started to feel "my legs are so heavy" night before and next morning 06/20/22 became lightheaded standing up and fell down on his back and hit his neck and head without loss of consciousness.  Denies any preceding palpitation shortness of breath.  Claimed that his liquid more swollen in 2 months has chronic right foot ulcer care at [facility] wound care.  Lives by himself with no children and wife ED Course: Blood pressure borderline low, no tachycardia afebrile.  CT head and neck no acute findings.  WBC 10.0, K4.1, creatinine 1.6 compared to baseline 1.7, bicarb 22. Patient was found to have fluid overload, and 40 mg Lasix given in the ED Patient was admitted for near syncope and fall, orthostatic vitals ordered, also admitted with right-sided CHF significant peripheral edema given Lasix, echo ordered renal function repeated  Echo EF 40-45%, diuresed and leg edema improved overall feeling much better weight down. Seen by PT OT wound care.  Plan is for skilled nursing facility for rehabilitation.   He has remained medically stable for discharge, but delays in insurance authorization for several days and now by bed availability at [facility] have delayed discharge.   Subjective: Frustrated with delay in SNF transfer, trying to get stronger here but it's hard. No chest pain, denies having chest pain yesterday, says it was a misunderstanding. No shortness of breath. Standing with walker with PT today. HR goes up acutely but returns back to normal quickly.  Objective: BP 105/76    Pulse 81   Temp (!) 97.4 F (36.3 C) (Oral)   Resp 17   Ht 6\' 3"  (1.905 m)   Wt 81.4 kg   SpO2 100%   BMI 22.42 kg/m   Gen: Elderly pleasant male in no acute distress working with therapy Pulm: Clear, nonlabored  CV: Irreg irreg without murmur, trace pitting edema when standing upright. GI: Soft, NT, ND, +BS  Neuro: Alert and oriented. No new focal deficits. Ext: Warm, no deformities Skin: No new rashes, lesions or ulcers on visualized skin   Assessment & Plan: Near syncope and fall Multiple falls at home: Likely multifactorial in the setting of patient's congestive heart failure, leg edema, wound and deconditioning.orthostatic positive placed on midodrine and compression stocking.   - Continue to mobilize with PT OT. TSH nl, MAL B12 - Continue B12 supplement.  - Continue PT OT as much as possible as SNF has been delayed by insurance authorization, now to be delayed over the weekend due to that bed being taken last night, no other beds available until Monday per CSW.   - Repeat orthostatics 12/14 stable. Continue midodrine   Acute on chronic HFrEF. LVEF at 40-45%, global hypokinesis.  BNP elevated 741 with worsening peripheral edema, chest x-ray low volume chest with mild atelectasis.  - Volume status has improved, creatinine bumped 12/6, holding lasix 20 mg starting 12/7. Since stabilized. - Follow up with cardiologist, Dr. 14/7 after discharge.  - Further GDMT is limited by soft BP on midodrine. Continue metoprolol, can consolidate to succinate at discharge.    Acute hypoxic respiratory failure : Resolved doing well on room air  Troponin 27-28 flat likely demand ischemia from CHF to CHF.  He never had chest pain. and no regional WMA on echo.  - Follow up with Dr. Ola Spurr.  Hypokalemia: Resolved. - Continue standing supplement  Hypertension: BP mostly soft, currently stable continue current midodrine and metoprolol. Will follow trends and adjust as indicated.  PAF  on Coumadin: Remains in atrial fibrillation with controlled ventricular response. Metoprolol dose decreased, Off Cardizem due to soft BP. INR being monitored by pharmacy as below  Elevated intact PTH 110, a month ago  CKD stage IIIb: Baseline creatinine 1.6-1.8 in careeverywhere  since 2022> now improved to 1.3. Recheck in the next week, will order for AM if stays here.  Chronic right leg wound: cont wound care. Appreciate input.  Neck Pain: Musculoskeletal, he attributes to neck collar in ambulance : X-ray shows multilevel degenerative disc disease and facet arthropathy. Pain is better. Cont Robaxin Norco, lidocaine patch. No meningismus. He mentioned chest pain 12/7 but quickly confirmed it was back pain, not exertional or pleuritic. That pain is chronic and intermittent and has not recurred. ECG ordered and stable from priors without ST elevations.  Patrecia Pour, MD [facility] [email or web address] 06/29/2022, 10:56 AM

## 2022-06-29 NOTE — Progress Notes (Signed)
ANTICOAGULATION CONSULT NOTE  Pharmacy Consult for Warfarin Indication: atrial fibrillation  No Known Allergies  Patient Measurements: Height: 6\' 3"  (190.5 cm) Weight: 81.4 kg (179 lb 6.4 oz) IBW/kg (Calculated) : 84.5  Vital Signs: Temp: 97.8 F (36.6 C) (12/08 0550) Temp Source: Oral (12/08 0550) BP: 101/71 (12/08 0550) Pulse Rate: 62 (12/08 0550)  Labs: Recent Labs    06/27/22 0616 06/28/22 0228 06/29/22 0236  LABPROT 23.2* 26.9* 26.5*  INR 2.1* 2.5* 2.5*  CREATININE 1.61*  --  1.64*     Estimated Creatinine Clearance: 34.5 mL/min (A) (by C-G formula based on SCr of 1.64 mg/dL (H)).  Assessment: 88 yo M presented to ED as a level 2 trauma s/p a mechanical fall, followed by a second fall secondarty to dizziness and weakness on 06/20/22. Abrasions noted to posterior head and bilateral elbows and no active bleeding noted on presentation to ED. Pt has hx atrial fibrillation and on Warfarin PTA, which has been continued per Pharmacy dosing.  INR is therapeutic at 2.5 with trend up.   PTA regimen: 2.5 mg daily except 1.25 mg on Thursdays.  Goal of Therapy:  INR 2-3 Monitor platelets by anticoagulation protocol: Yes   Plan:  Warfarin 2.5 mg daily except 1.25 mg on Thursdays. Daily PT/INR  12-23-1979, PharmD Clinical Pharmacist **Pharmacist phone directory can now be found on [email or web address] ([email or web address]).  Listed under [facility].

## 2022-06-29 NOTE — Progress Notes (Signed)
Mobility Specialist - Progress Note   06/29/22 1604  Mobility  Activity Transferred from chair to bed  Level of Assistance Minimal assist, patient does 75% or more  Assistive Device None  Activity Response Tolerated well  Mobility Referral Yes  $Mobility charge 1 Mobility   Pt received trying to get out of the chair to get back to bed. Pt was agreeable to assistance bacl tp bed. Pt was MinA for transfer back. Pt was left in bed with all needs met and RN notified.  [name]  Mobility Specialist Please contact via Solicitor or Rehab office at [phone]

## 2022-06-30 DIAGNOSIS — I5021 Acute systolic (congestive) heart failure: Secondary | ICD-10-CM | POA: Diagnosis not present

## 2022-06-30 DIAGNOSIS — R262 Difficulty in walking, not elsewhere classified: Secondary | ICD-10-CM | POA: Diagnosis not present

## 2022-06-30 LAB — PROTIME-INR
INR: 2.4 — ABNORMAL HIGH (ref 0.8–1.2)
Prothrombin Time: 26.3 seconds — ABNORMAL HIGH (ref 11.4–15.2)

## 2022-06-30 NOTE — Progress Notes (Signed)
ANTICOAGULATION CONSULT NOTE  Pharmacy Consult for Warfarin Indication: atrial fibrillation  No Known Allergies  Patient Measurements: Height: 6\' 3"  (190.5 cm) Weight: 81.1 kg (178 lb 14.4 oz) IBW/kg (Calculated) : 84.5  Vital Signs: Temp: 97.6 F (36.4 C) (12/09 0716) Temp Source: Oral (12/09 0716) BP: 89/71 (12/09 0716) Pulse Rate: 84 (12/09 0716)  Labs: Recent Labs    06/28/22 0228 06/29/22 0236 06/30/22 0133  LABPROT 26.9* 26.5* 26.3*  INR 2.5* 2.5* 2.4*  CREATININE  --  1.64*  --      Estimated Creatinine Clearance: 34.3 mL/min (A) (by C-G formula based on SCr of 1.64 mg/dL (H)).  Assessment: 88 yo M presented to ED as a level 2 trauma s/p a mechanical fall, followed by a second fall secondary to dizziness and weakness on 06/20/22. Abrasions noted to posterior head and bilateral elbows and no active bleeding noted on presentation to ED. Pt has hx atrial fibrillation and on Warfarin PTA, which has been continued per Pharmacy dosing.  INR is therapeutic at 2.4 with trend up.   PTA regimen: 2.5 mg daily except 1.25 mg on Thursdays.  Goal of Therapy:  INR 2-3 Monitor platelets by anticoagulation protocol: Yes   Plan:  Warfarin 2.5 mg daily except 1.25 mg on Thursdays. Daily PT/INR  12-23-1979, PharmD Pharmacy Resident  06/30/2022 9:01 AM

## 2022-06-30 NOTE — TOC Progression Note (Signed)
Transition of Care [facility]) - Progression Note    Patient Details  Name: Hector Vazquez MRN: [id number] Date of Birth: 1930/07/21  Transition of Care [facility]) CM/SW Contact  Patrice Paradise, LCSW Phone Number: 06/30/2022, 11:34 AM  Clinical Narrative:     CSW attempted to speak with [place] at [facility] to confirm bed availability, had to leave voice mail.  TOC team will continue to assist with discharge planning needs.   Expected Discharge Plan:  (Home interested in hh services) Barriers to Discharge: Continued Medical Work up  Expected Discharge Plan and Services Expected Discharge Plan:  (Home interested in hh services) In-house Referral: Clinical Social Work     Living arrangements for the past 2 months: Single Family Home Expected Discharge Date: 06/29/22                                     Social Determinants of Health (SDOH) Interventions    Readmission Risk Interventions   No data to display

## 2022-06-30 NOTE — Progress Notes (Signed)
[facility] PROGRESS NOTE  Hector Vazquez (DOB: 1929/09/15) [id number] PCP: Randel Pigg, Dorma Russell, MD  Brief Narrative: 88 y.o.m w/ history of CAD, PAF on Coumadin, HLD, CKD stage IIIb, peripheral neuropathy, peripheral edema secondary to venous insufficiency, chronic ambulatory dysfunction at baseline using cane to ambulate presented with multiple fall He has had multiple falls this year with multiple ED visits patient started to feel "my legs are so heavy" night before and next morning 06/20/22 became lightheaded standing up and fell down on his back and hit his neck and head without loss of consciousness.  Denies any preceding palpitation shortness of breath.  Claimed that his liquid more swollen in 2 months has chronic right foot ulcer care at [facility] wound care.  Lives by himself with no children and wife ED Course: Blood pressure borderline low, no tachycardia afebrile.  CT head and neck no acute findings.  WBC 10.0, K4.1, creatinine 1.6 compared to baseline 1.7, bicarb 22. Patient was found to have fluid overload, and 40 mg Lasix given in the ED Patient was admitted for near syncope and fall, orthostatic vitals ordered, also admitted with right-sided CHF significant peripheral edema given Lasix, echo ordered renal function repeated  Echo EF 40-45%, diuresed and leg edema improved overall feeling much better weight down. Seen by PT OT wound care.  Plan is for skilled nursing facility for rehabilitation.   He has remained medically stable for discharge, but delays in insurance authorization for several days and now by bed availability at [facility] have delayed discharge.   Subjective: No new complaints or issues to speak of.  Objective: BP 96/66   Pulse 84   Temp 97.6 F (36.4 C) (Oral)   Resp 19   Ht 6\' 3"  (1.905 m)   Wt 81.1 kg   SpO2 100%   BMI 22.36 kg/m   Elderly pleasant male in no distress No meningismus or palpable deformity Irreg irreg with trace pitting edema,  no JVD Clear, nonlabored No focal deficits  Assessment & Plan: Near syncope and fall Multiple falls at home: Likely multifactorial in the setting of patient's congestive heart failure, leg edema, wound and deconditioning.orthostatic positive placed on midodrine and compression stocking.   - Continue to mobilize with PT OT. TSH nl, MAL B12 - Continue B12 supplement.  - Continue PT OT as much as possible as SNF has been delayed by insurance authorization, now to be delayed over the weekend due to that bed being taken last night, no other beds available until Monday per CSW.   - Repeat orthostatics 12/14 stable. Continue midodrine   Acute on chronic HFrEF. LVEF at 40-45%, global hypokinesis.  BNP elevated 741 with worsening peripheral edema, chest x-ray low volume chest with mild atelectasis.  - Volume status has improved, creatinine bumped 12/6, holding lasix 20 mg starting 12/7. Since stabilized. Recheck early this coming week, either at facility (hopefully) or here. - Follow up with cardiologist, Dr. 14/7 after discharge.  - Further GDMT is limited by soft BP on midodrine. Continue metoprolol, can consolidate to succinate at discharge.    Acute hypoxic respiratory failure : Resolved doing well on room air   Troponin 27-28 flat likely demand ischemia from CHF to CHF.  He never had chest pain. and no regional WMA on echo.  - Follow up with Dr. 10-20-2001.  Hypokalemia: Resolved. - Continue standing supplement  Hypertension: BP mostly soft, currently stable continue current midodrine and metoprolol. Will follow trends and adjust as indicated.  PAF on Coumadin:  Remains in atrial fibrillation with controlled ventricular response. Metoprolol dose decreased, Off Cardizem due to soft BP. INR being monitored by pharmacy as below  Elevated intact PTH 110, a month ago  CKD stage IIIb: Baseline creatinine 1.6-1.8 in careeverywhere  since 2022> now improved to 1.3. Recheck in the next week,  will order for AM if stays here.  Chronic right leg wound: cont wound care. Appreciate input.  Neck Pain: Musculoskeletal, he attributes to neck collar in ambulance : X-ray shows multilevel degenerative disc disease and facet arthropathy. Pain is better. Cont Robaxin Norco, lidocaine patch. No meningismus. He mentioned chest pain 12/7 but quickly confirmed it was back pain, not exertional or pleuritic. That pain is chronic and intermittent and has not recurred. ECG ordered and stable from priors without ST elevations.  Tyrone Nine, MD [facility] [email or web address] 06/30/2022, 10:57 AM

## 2022-07-01 DIAGNOSIS — I5021 Acute systolic (congestive) heart failure: Secondary | ICD-10-CM | POA: Diagnosis not present

## 2022-07-01 DIAGNOSIS — R262 Difficulty in walking, not elsewhere classified: Secondary | ICD-10-CM | POA: Diagnosis not present

## 2022-07-01 LAB — BASIC METABOLIC PANEL
Anion gap: 9 (ref 5–15)
BUN: 32 mg/dL — ABNORMAL HIGH (ref 8–23)
CO2: 22 mmol/L (ref 22–32)
Calcium: 8.6 mg/dL — ABNORMAL LOW (ref 8.9–10.3)
Chloride: 106 mmol/L (ref 98–111)
Creatinine, Ser: 1.65 mg/dL — ABNORMAL HIGH (ref 0.61–1.24)
GFR, Estimated: 39 mL/min — ABNORMAL LOW (ref >60)
Glucose, Bld: 111 mg/dL — ABNORMAL HIGH (ref 70–99)
Potassium: 4.6 mmol/L (ref 3.5–5.1)
Sodium: 137 mmol/L (ref 135–145)

## 2022-07-01 LAB — PROTIME-INR
INR: 2.3 — ABNORMAL HIGH (ref 0.8–1.2)
Prothrombin Time: 25.4 seconds — ABNORMAL HIGH (ref 11.4–15.2)

## 2022-07-01 NOTE — Progress Notes (Signed)
[facility] PROGRESS NOTE  Hector Vazquez (DOB: 23-Aug-1929) [id number] PCP: Randel Pigg, Dorma Russell, MD  Brief Narrative: 88 y.o.m w/ history of CAD, PAF on Coumadin, HLD, CKD stage IIIb, peripheral neuropathy, peripheral edema secondary to venous insufficiency, chronic ambulatory dysfunction at baseline using cane to ambulate presented with multiple fall He has had multiple falls this year with multiple ED visits patient started to feel "my legs are so heavy" night before and next morning 06/20/22 became lightheaded standing up and fell down on his back and hit his neck and head without loss of consciousness.  Denies any preceding palpitation shortness of breath.  Claimed that his liquid more swollen in 2 months has chronic right foot ulcer care at [facility] wound care.  Lives by himself with no children and wife ED Course: Blood pressure borderline low, no tachycardia afebrile.  CT head and neck no acute findings.  WBC 10.0, K4.1, creatinine 1.6 compared to baseline 1.7, bicarb 22. Patient was found to have fluid overload, and 40 mg Lasix given in the ED Patient was admitted for near syncope and fall, orthostatic vitals ordered, also admitted with right-sided CHF significant peripheral edema given Lasix, echo ordered renal function repeated  Echo EF 40-45%, diuresed and leg edema improved overall feeling much better weight down. Seen by PT OT wound care.  Plan is for skilled nursing facility for rehabilitation.   He has remained medically stable for discharge, but delays in insurance authorization for several days and now by bed availability at [facility] have delayed discharge.   Subjective: Wants to get rehab, no physical complaints, feels better and stronger than at arrival.   Objective: BP 116/69 (BP Location: Left Arm)   Pulse 88   Temp 97.7 F (36.5 C) (Oral)   Resp 20   Ht 6\' 3"  (1.905 m)   Wt 81.4 kg   SpO2 100%   BMI 22.43 kg/m   Gen: Elderly male in no distress Pulm:  Clear, nonlabored  CV: Irreg irreg, no JVD, mild pitting edema GI: Soft, NT, ND, +BS  Neuro: Alert and oriented. No new focal deficits. Ext: Warm, no deformities Skin: No new rashes, lesions or ulcers on visualized skin   Assessment & Plan: Near syncope and fall Multiple falls at home: Likely multifactorial in the setting of patient's congestive heart failure, leg edema, wound and deconditioning.orthostatic positive placed on midodrine and compression stocking.   - Continue to mobilize with PT OT. TSH nl, MAL B12 - Continue B12 supplement.  - Continue PT OT as much as possible as SNF has been delayed by insurance authorization, now to be delayed over the weekend due to that bed being taken last night, no other beds available until Monday per CSW.   - Repeat orthostatics 12/14 stable. Continue midodrine   Acute on chronic HFrEF. LVEF at 40-45%, global hypokinesis.  BNP elevated 741 with worsening peripheral edema, chest x-ray low volume chest with mild atelectasis.  - Volume status has improved, creatinine bumped 12/6, holding lasix 20 mg starting 12/7. Not on diuretic PTA. - Follow up with cardiologist, Dr. 14/7 after discharge.  - Further GDMT is limited by soft BP on midodrine. Continue metoprolol, can consolidate to succinate at discharge.    Acute hypoxic respiratory failure : Resolved doing well on room air   Troponin 27-28 flat likely demand ischemia from CHF to CHF.  He never had chest pain. and no regional WMA on echo.  - Follow up with Dr. 10-20-2001.  Hypokalemia: Resolved. - Continue  standing supplement  Hypertension: BP mostly soft, currently stable.  - Continue current midodrine and metoprolol. Will follow trends and adjust as indicated.  PAF on Coumadin: Remains in atrial fibrillation with controlled ventricular response.  - Metoprolol dose decreased due to hypotension. Holding diltiazem with soft BP and LV systolic dysfunction. - INR remaining therapeutic on  home regimen, continue coumadin 2.5mg  daily except 1.25mg  on Thursdays.   Elevated intact PTH 110, a month ago  CKD stage IIIb: Baseline creatinine 1.6-1.8 in care everywhere  since 2022. He remains at that baseline, appears euvolemic. - Avoid nephrotoxins and monitor intermittently.  Chronic neuropathic right foot wound: No infection suspected at this time.  - Cover silver hydrofiber and foam dressing change QOD.  - continue routine follow up with [facility] wound care center/podiatry.  Neck Pain: Musculoskeletal, he attributes to neck collar in ambulance : X-ray shows multilevel degenerative disc disease and facet arthropathy. Pain is better. Cont Robaxin Norco, lidocaine patch. No meningismus. He mentioned chest pain 12/7 but quickly confirmed it was back pain, not exertional or pleuritic. That pain is chronic and intermittent and has not recurred. ECG ordered and stable from priors without ST elevations.  Tyrone Nine, MD [facility] [email or web address] 07/01/2022, 10:24 AM

## 2022-07-01 NOTE — Progress Notes (Signed)
ANTICOAGULATION CONSULT NOTE  Pharmacy Consult for Warfarin Indication: atrial fibrillation  No Known Allergies  Patient Measurements: Height: 6\' 3"  (190.5 cm) Weight: 81.4 kg (179 lb 7.3 oz) IBW/kg (Calculated) : 84.5  Vital Signs: Temp: 97.7 F (36.5 C) (12/10 0820) Temp Source: Oral (12/10 0820) BP: 116/69 (12/10 0820) Pulse Rate: 88 (12/10 0820)  Labs: Recent Labs    06/29/22 0236 06/30/22 0133 07/01/22 0133  LABPROT 26.5* 26.3* 25.4*  INR 2.5* 2.4* 2.3*  CREATININE 1.64*  --  1.65*     Estimated Creatinine Clearance: 34.3 mL/min (A) (by C-G formula based on SCr of 1.65 mg/dL (H)).  Assessment: 88 yo M presented to ED as a level 2 trauma s/p a mechanical fall, followed by a second fall secondary to dizziness and weakness on 06/20/22. Abrasions noted to posterior head and bilateral elbows and no active bleeding noted on presentation to ED. Pt has hx atrial fibrillation and on Warfarin PTA, which has been continued per Pharmacy dosing.  INR is therapeutic at 2.3. Last CBC 12/04 - stable.   PTA regimen: 2.5 mg daily except 1.25 mg on Thursdays.  Goal of Therapy:  INR 2-3 Monitor platelets by anticoagulation protocol: Yes   Plan:  Warfarin 2.5 mg daily except 1.25 mg on Thursdays. Daily PT/INR  12-23-1979, PharmD Pharmacy Resident  07/01/2022 9:31 AM

## 2022-07-02 DIAGNOSIS — R262 Difficulty in walking, not elsewhere classified: Secondary | ICD-10-CM | POA: Diagnosis not present

## 2022-07-02 DIAGNOSIS — I5021 Acute systolic (congestive) heart failure: Secondary | ICD-10-CM | POA: Diagnosis not present

## 2022-07-02 LAB — PROTIME-INR
INR: 2.2 — ABNORMAL HIGH (ref 0.8–1.2)
Prothrombin Time: 24 seconds — ABNORMAL HIGH (ref 11.4–15.2)

## 2022-07-02 MED ORDER — MIDODRINE HCL 5 MG PO TABS: 5.0000 mg | ORAL_TABLET | Freq: Three times a day (TID) | ORAL | Status: DC

## 2022-07-02 NOTE — Plan of Care (Signed)

## 2022-07-02 NOTE — Care Management Important Message (Signed)
Important Message  Patient Details  Name: Hector Vazquez MRN: [id number] Date of Birth: 1930/05/24   Medicare Important Message Given:  Yes     Renie Ora 07/02/2022, 8:49 AM

## 2022-07-02 NOTE — TOC Transition Note (Signed)
Transition of Care [facility]) - CM/SW Discharge Note   Patient Details  Name: Hector Vazquez MRN: [id number] Date of Birth: February 13, 1930  Transition of Care [facility]) CM/SW Contact:  Delilah Shan, LCSWA Phone Number: 07/02/2022, 11:03 AM   Clinical Narrative:     Patient will DC to: [facility]   Anticipated DC date: 07/02/2022  Family notified: Geologist, engineering by: Sharin Mons  ?  Per MD patient ready for DC to [facility] . RN, patient, patient's family, and facility notified of DC. Discharge Summary sent to facility. RN given number for report tele# [phone] RM# [id number]. DC packet on chart. DNR signed by MD attached to patients DC packet. Ambulance transport requested for patient.  CSW signing off.   Final next level of care: Skilled Nursing Facility Barriers to Discharge: No Barriers Identified   Patient Goals and CMS Choice Patient states their goals for this hospitalization and ongoing recovery are:: SNF CMS Medicare.gov Compare Post Acute Care list provided to:: Patient (patient and friend [name]) Choice offered to / list presented to : Patient  Discharge Placement              Patient chooses bed at: [facility] Patient to be transferred to facility by: [facility] Name of family member notified: [name] Patient and family notified of of transfer: 07/02/22  Discharge Plan and Services In-house Referral: Clinical Social Work                                   Social Determinants of Health (SDOH) Interventions     Readmission Risk Interventions     No data to display

## 2022-07-02 NOTE — Progress Notes (Signed)
Report called to [facility] room 604b. Pt transported with all personal belongings, AVS and DNAR paperwork with clinical transporters. Vitals are stable. Pt denies pain or distress at this time.

## 2022-07-02 NOTE — Discharge Summary (Signed)
Physician Discharge Summary   Patient: Hector Vazquez MRN: [id number] DOB: [birth year]  Admit date:     06/20/2022  Discharge date: 07/02/22  Discharge Physician: Patrecia Pour   PCP: Patrecia Pour, Christean Grief, MD   Recommendations at discharge:  Follow up with his excellent PCP in 1-2 weeks.  Follow up with cardiology, Dr. Ola Spurr after returning home from [facility] for management of AFib, CHF.   Discharge Diagnoses: Principal Problem:   CHF (congestive heart failure) (HCC) Active Problems:   Acute on chronic systolic CHF (congestive heart failure) (HCC)   Impaired ambulation  Resolved Problems:   * No resolved hospital problems. [facility] Course: 88 y.o.m w/ history of CAD, PAF on Coumadin, HLD, CKD stage IIIb, peripheral neuropathy, peripheral edema secondary to venous insufficiency, chronic ambulatory dysfunction at baseline using cane to ambulate presented with multiple fall He has had multiple falls this year with multiple ED visits patient started to feel "my legs are so heavy" night before and next morning 06/20/22 became lightheaded standing up and fell down on his back and hit his neck and head without loss of consciousness.  Denies any preceding palpitation shortness of breath.  Claimed that his liquid more swollen in 2 months has chronic right foot ulcer care at [facility] wound care.  Lives by himself with no children and wife  In the ED, blood pressure borderline low, no tachycardia afebrile.  CT head and neck no acute findings.  WBC 10.0, K4.1, creatinine 1.6 compared to baseline 1.7, bicarb 22. Patient was found to have fluid overload, and 40 mg Lasix given in the ED Patient was admitted for near syncope and fall, orthostatic vitals ordered, also admitted with right-sided CHF significant peripheral edema given Lasix, echo ordered renal function repeated  Echo EF 40-45%, diuresed and leg edema improved overall feeling much better weight down. Seen by PT OT wound care.  Plan  is for skilled nursing facility for rehabilitation.    He has remained medically stable for discharge, but delays in insurance authorization for several days and now by bed availability at [facility] have delayed discharge.   Assessment and Plan: Near syncope and fall Multiple falls at home: Likely multifactorial in the setting of patient's congestive heart failure, leg edema, wound and deconditioning.orthostatic positive placed on midodrine and compression stocking.   - Continue to mobilize with PT OT. TSH nl, MAL B12 - Continue B12 supplement.  - Continue PT OT at SNF. - Repeat orthostatics 12/14 stable. Continue midodrine.   Acute on chronic HFrEF. LVEF at 40-45%, global hypokinesis.  BNP elevated 741 with worsening peripheral edema, chest x-ray low volume chest with mild atelectasis.  - Volume status has improved, creatinine bumped 12/6, holding lasix 20 mg starting 12/7. Not on diuretic PTA. - Follow up with cardiologist, Dr. Ola Spurr after discharge.  - Further GDMT is limited by soft BP on midodrine. Continue metoprolol, can consolidate to succinate at discharge.     Acute hypoxic respiratory failure : Resolved doing well on room air    Troponin 27-28 flat likely demand ischemia from CHF to CHF.  He never had chest pain. and no regional WMA on echo or new ischemic findings by ECG.  - Follow up with Dr. Ola Spurr. - Continue statin, anticoagulation   Hypokalemia: Resolved. - Continue standing supplement   Hypertension: BP mostly soft, currently stable.  - Continue current midodrine and metoprolol. Will follow trends and adjust as indicated.   PAF on Coumadin: Remains in atrial fibrillation with controlled  ventricular response.  - Metoprolol dose decreased due to hypotension. Holding diltiazem with soft BP and LV systolic dysfunction. - INR remaining therapeutic on home regimen, continue coumadin 2.5mg  daily except 1.25mg  on Thursdays.    Elevated intact PTH 110, a month  ago   CKD stage IIIb: Baseline creatinine 1.6-1.8 in care everywhere  since 2022. He remains at that baseline, appears euvolemic. - Avoid nephrotoxins and monitor intermittently.   Chronic neuropathic right foot wound: No infection suspected at this time.  - Cover silver hydrofiber and foam dressing change QOD.  - continue routine follow up with [facility] wound care center/podiatry.   Neck Pain: Musculoskeletal, he attributes to neck collar in ambulance : X-ray shows multilevel degenerative disc disease and facet arthropathy. Pain is better. Cont Robaxin Norco, lidocaine patch. No meningismus. He mentioned chest pain 12/7 but quickly confirmed it was back pain, not exertional or pleuritic. That pain is chronic and intermittent and has not recurred. ECG ordered and stable from priors without ST elevations.  Consultants: None Procedures performed: None  Disposition: Skilled nursing facility Diet recommendation:  Cardiac diet DISCHARGE MEDICATION: Allergies as of 07/02/2022   No Known Allergies      Medication List     STOP taking these medications    diltiazem 240 MG 24 hr capsule Commonly known as: CARDIZEM CD       TAKE these medications    b complex vitamins capsule Take 1 capsule by mouth daily.   Cholecalciferol 25 MCG (1000 UT) capsule Take 1,000 Units by mouth daily.   diclofenac Sodium 1 % Gel Commonly known as: VOLTAREN Apply 4 g topically 4 (four) times daily.   lovastatin 20 MG tablet Commonly known as: MEVACOR Take 20 mg by mouth daily.   metoprolol succinate 25 MG 24 hr tablet Commonly known as: TOPROL-XL Take 25 mg by mouth daily.   midodrine 5 MG tablet Commonly known as: PROAMATINE Take 1 tablet (5 mg total) by mouth 3 (three) times daily with meals.   warfarin 2.5 MG tablet Commonly known as: COUMADIN Take 1.25-2.5 mg by mouth See admin instructions. Pt takes 2.5mg  every day of the week except for Thursdays, patient takes 1.25mg .         Contact information for follow-up providers     Patrecia Pour, Christean Grief, MD. Schedule an appointment as soon as possible for a visit.   Specialty: Family Medicine Contact information: [place] [address] [phone]         Ebbie Ridge, MD. Schedule an appointment as soon as possible for a visit.   Specialty: Cardiology Why: as soon as possible Contact information: [place] [address] [phone]              Contact information for after-discharge care     Destination     [facility] .   Service: Skilled Nursing Contact information: [address] [place] [phone]                    Discharge Exam: Filed Weights   06/29/22 0550 06/30/22 0432 07/01/22 0011  Weight: 81.4 kg 81.1 kg 81.4 kg  BP 115/61   Pulse 80   Temp (!) 97.5 F (36.4 C) (Oral)   Resp 20   Ht 6\' 3"  (1.905 m)   Wt 81.4 kg   SpO2 98%   BMI 22.43 kg/m   Gen: No distress,  sitting in chair Pulm: Clear, nonlabored  CV: Irreg, rate is in 80's, no LE edema or JVD GI: Soft, NT, ND, +BS  Neuro: Alert and oriented. No new focal deficits. Ext: Warm, no deformities Skin: No new rashes, lesions or ulcers on visualized skin   Condition at discharge: stable  The results of significant diagnostics from this hospitalization (including imaging, microbiology, ancillary and laboratory) are listed below for reference.   Imaging Studies: DG Cervical Spine 2 or 3 views  Result Date: 06/22/2022 CLINICAL DATA:  Neck pain EXAM: CERVICAL SPINE - 2-3 VIEW COMPARISON:  None Available. FINDINGS: There is no radiographic evidence of cervical spine fracture. Straightening of the cervical lordosis with slight reversal centered at C3-C4. There is mild to moderate multilevel degenerative disc disease and moderate-severe multilevel facet arthropathy. IMPRESSION: No acute osseous abnormality.  Multilevel degenerative disc disease and facet arthropathy. Electronically Signed   By: Caprice Renshaw M.D.   On: 06/22/2022 12:13   ECHOCARDIOGRAM COMPLETE  Result Date: 06/21/2022    ECHOCARDIOGRAM REPORT   Patient Name:   Pope Lingafelter Date of Exam: 06/21/2022 Medical Rec #:  [id number]   Height:       75.0 in Accession #:    [id number]  Weight:       220.0 lb Date of Birth:  Jun 06, 1930    BSA:          2.285 m Patient Age:    88 years    BP:           116/88 mmHg Patient Gender: M           HR:           75 bpm. Exam Location:  Inpatient Procedure: 2D Echo and Intracardiac Opacification Agent Indications:    CHF  History:        Patient has no prior history of Echocardiogram examinations.  Sonographer:    Cathie Hoops Referring Phys: [id number] PING T Chipper Herb  Sonographer Comments: Technically difficult study due to poor echo windows and no subcostal window. IMPRESSIONS  1. EF challenging in the setting of atrial fibrillation. SVi is low 21 cc/m2. Left ventricular ejection fraction, by estimation, is 40 to 45%. The left ventricle has mildly decreased function. The left ventricle demonstrates global hypokinesis. Left ventricular diastolic parameters are indeterminate.  2. Right ventricular systolic function is normal. The right ventricular size is mildly enlarged. There is normal pulmonary artery systolic pressure.  3. Left atrial size was moderately dilated.  4. The mitral valve was not well visualized. Mild mitral valve regurgitation.  5. The aortic valve is tricuspid. Aortic valve regurgitation is mild.  6. Aneurysm of the aortic root, measuring 40 mm.  7. The inferior vena cava is normal in size with greater than 50% respiratory variability, suggesting right atrial pressure of 3 mmHg. FINDINGS  Left Ventricle: EF challenging in the setting of atrial fibrillation. SVi is low 21 cc/m2. Left ventricular ejection fraction, by estimation, is 40 to 45%. The left ventricle has mildly decreased function. The left ventricle  demonstrates global hypokinesis. The left ventricular internal cavity size was normal in size. There is no left ventricular hypertrophy. Left ventricular diastolic parameters are indeterminate. Right Ventricle: The right ventricular size is mildly enlarged. Right ventricular systolic function is normal. There is normal pulmonary artery systolic pressure. The tricuspid regurgitant velocity is 2.69 m/s, and with an assumed right atrial pressure of 3 mmHg, the estimated right ventricular systolic pressure is 31.9 mmHg. Left Atrium: Left atrial size  was moderately dilated. Right Atrium: Right atrial size was normal in size. Pericardium: There is no evidence of pericardial effusion. Mitral Valve: The mitral valve was not well visualized. Mild mitral valve regurgitation. Tricuspid Valve: Tricuspid valve regurgitation is mild. Aortic Valve: The aortic valve is tricuspid. Aortic valve regurgitation is mild. Aortic regurgitation PHT measures 614 msec. Aortic valve mean gradient measures 3.0 mmHg. Aortic valve peak gradient measures 4.8 mmHg. Aortic valve area, by VTI measures 2.37 cm. Pulmonic Valve: Pulmonic valve regurgitation is not visualized. Aorta: There is an aneurysm involving the aortic root measuring 40 mm. Venous: The inferior vena cava is normal in size with greater than 50% respiratory variability, suggesting right atrial pressure of 3 mmHg. IAS/Shunts: No atrial level shunt detected by color flow Doppler.  LEFT VENTRICLE PLAX 2D LVIDd:         5.00 cm     Diastology LVIDs:         4.00 cm     LV e' medial:    10.20 cm/s LV PW:         1.00 cm     LV E/e' medial:  8.0 LV IVS:        1.00 cm     LV e' lateral:   11.10 cm/s LVOT diam:     2.20 cm     LV E/e' lateral: 7.4 LV SV:         48 LV SV Index:   21 LVOT Area:     3.80 cm  LV Volumes (MOD) LV vol d, MOD A2C: 86.9 ml LV vol d, MOD A4C: 98.6 ml LV vol s, MOD A2C: 54.2 ml LV vol s, MOD A4C: 58.4 ml LV SV MOD A2C:     32.7 ml LV SV MOD A4C:     98.6 ml LV SV  MOD BP:      34.8 ml RIGHT VENTRICLE RV Basal diam:  4.40 cm RV Mid diam:    4.40 cm RV S prime:     13.30 cm/s TAPSE (M-mode): 2.1 cm LEFT ATRIUM              Index        RIGHT ATRIUM           Index LA diam:        4.60 cm  2.01 cm/m   RA Area:     20.90 cm LA Vol (A2C):   110.0 ml 48.14 ml/m  RA Volume:   53.60 ml  23.46 ml/m LA Vol (A4C):   75.3 ml  32.95 ml/m LA Biplane Vol: 94.2 ml  41.22 ml/m  AORTIC VALVE                    PULMONIC VALVE AV Area (Vmax):    2.72 cm     PV Vmax:       0.95 m/s AV Area (Vmean):   2.65 cm     PV Peak grad:  3.6 mmHg AV Area (VTI):     2.37 cm AV Vmax:           109.00 cm/s AV Vmean:          74.900 cm/s AV VTI:            0.202 m AV Peak Grad:      4.8 mmHg AV Mean Grad:      3.0 mmHg LVOT Vmax:         78.00 cm/s LVOT Vmean:  52.200 cm/s LVOT VTI:          0.126 m LVOT/AV VTI ratio: 0.62 AI PHT:            614 msec  AORTA Ao Root diam: 4.00 cm Ao Asc diam:  3.60 cm MITRAL VALVE               TRICUSPID VALVE MV Area (PHT): 4.60 cm    TR Peak grad:   28.9 mmHg MV Decel Time: 165 msec    TR Vmax:        269.00 cm/s MR Peak grad: 81.4 mmHg MR Vmax:      451.00 cm/s  SHUNTS MV E velocity: 82.10 cm/s  Systemic VTI:  0.13 m MV A velocity: 59.80 cm/s  Systemic Diam: 2.20 cm MV E/A ratio:  1.37 Placido Sou signed by Phineas Inches Signature Date/Time: 06/21/2022/2:08:13 PM    Final    CT CERVICAL SPINE WO CONTRAST  Result Date: 06/20/2022 CLINICAL DATA:  Polytrauma, blunt.  Fall. EXAM: CT CERVICAL SPINE WITHOUT CONTRAST TECHNIQUE: Multidetector CT imaging of the cervical spine was performed without intravenous contrast. Multiplanar CT image reconstructions were also generated. RADIATION DOSE REDUCTION: This exam was performed according to the departmental dose-optimization program which includes automated exposure control, adjustment of the mA and/or kV according to patient size and/or use of iterative reconstruction technique. COMPARISON:  None  Available. FINDINGS: Alignment: Normal Skull base and vertebrae: No acute fracture. No primary bone lesion or focal pathologic process. Soft tissues and spinal canal: No prevertebral fluid or swelling. No visible canal hematoma. Disc levels: Mild degenerative disc disease, most pronounced at C5-6 and C6-7 with disc space narrowing and spurring. Advanced degenerative facet disease diffusely, left greater than right. Upper chest: No acute findings Other: None IMPRESSION: Degenerative disc and facet disease. No acute bony abnormality. Electronically Signed   By: Rolm Baptise M.D.   On: 06/20/2022 10:27   CT HEAD WO CONTRAST (5MM)  Result Date: 06/20/2022 CLINICAL DATA:  Head trauma, minor (Age >= 65y).  Fall EXAM: CT HEAD WITHOUT CONTRAST TECHNIQUE: Contiguous axial images were obtained from the base of the skull through the vertex without intravenous contrast. RADIATION DOSE REDUCTION: This exam was performed according to the departmental dose-optimization program which includes automated exposure control, adjustment of the mA and/or kV according to patient size and/or use of iterative reconstruction technique. COMPARISON:  None Available. FINDINGS: Brain: Age related volume loss/atrophy. No acute intracranial abnormality. Specifically, no hemorrhage, hydrocephalus, mass lesion, acute infarction, or significant intracranial injury. Vascular: No hyperdense vessel or unexpected calcification. Skull: No acute calvarial abnormality. Sinuses/Orbits: No acute findings Other: None IMPRESSION: No acute intracranial abnormality. Electronically Signed   By: Rolm Baptise M.D.   On: 06/20/2022 10:23   DG Pelvis Portable  Result Date: 06/20/2022 CLINICAL DATA:  Fall EXAM: PORTABLE PELVIS 1-2 VIEWS COMPARISON:  None Available. FINDINGS: Surgical changes of right total hip arthroplasty are identified. Normal alignment. No acute fracture or dislocation. Numerous surgical clips are noted within the pelvis bilaterally.  IMPRESSION: 1. Right total hip arthroplasty. No acute fracture or dislocation. Electronically Signed   By: Fidela Salisbury M.D.   On: 06/20/2022 10:11   DG Elbow Complete Left  Result Date: 06/20/2022 CLINICAL DATA:  Status post fall, elbow pain EXAM: LEFT ELBOW - COMPLETE 3+ VIEW COMPARISON:  None Available. FINDINGS: No acute fracture or dislocation. No aggressive osseous lesion. Normal alignment. Enthesopathic changes at the triceps tendon insertion. Soft tissue are unremarkable. No radiopaque  foreign body or soft tissue emphysema. IMPRESSION: 1. No acute osseous injury of the left elbow. Electronically Signed   By: Kathreen Devoid M.D.   On: 06/20/2022 09:59   DG Chest Portable 1 View  Result Date: 06/20/2022 CLINICAL DATA:  Fall EXAM: PORTABLE CHEST 1 VIEW COMPARISON:  07/02/2008 FINDINGS: Generous heart size accentuated by rotation and low volumes. Widening of the upper right mediastinum that is stable and likely from ectatic vessels. Mild linear opacity at the right base. Lung volumes are low. No edema, effusion, or pneumothorax. No visible fracture IMPRESSION: Low volume chest with mild atelectasis.  No visible injury. Electronically Signed   By: Jorje Guild M.D.   On: 06/20/2022 09:56    Microbiology: No results found for this or any previous visit.  Labs: CBC: No results for input(s): "WBC", "NEUTROABS", "HGB", "HCT", "MCV", "PLT" in the last 168 hours. Basic Metabolic Panel: Recent Labs  Lab 06/26/22 0712 06/27/22 0616 06/29/22 0236 07/01/22 0133  NA 136 133* 136 137  K 4.0 4.1 4.3 4.6  CL 106 100 105 106  CO2 25 25 22 22   GLUCOSE 92 98 99 111*  BUN 28* 27* 32* 32*  CREATININE 1.37* 1.61* 1.64* 1.65*  CALCIUM 8.3* 8.3* 8.5* 8.6*   Liver Function Tests: No results for input(s): "AST", "ALT", "ALKPHOS", "BILITOT", "PROT", "ALBUMIN" in the last 168 hours. CBG: No results for input(s): "GLUCAP" in the last 168 hours.  Discharge time spent: greater than 30  minutes.  Signed: Patrecia Pour, MD [facility] 07/02/2022

## 2022-07-02 NOTE — Progress Notes (Signed)
Mobility Specialist - Progress Note   07/02/22 1211  Mobility  Activity Stood at bedside  Level of Assistance Moderate assist, patient does 50-74%  Assistive Device Front wheel walker  Activity Response Tolerated fair  Mobility Referral Yes  $Mobility charge 1 Mobility   Pt received in chair and agreeable to session. Pt was able to complete 3 bouts of sit to stands, needing ModA to stand. Pt declined further ambulation stating he was leaving soon and did not want to tire himself out. Pt was left in chair with all needs met.  [name]  Mobility Specialist Please contact via Solicitor or Rehab office at [phone]

## 2022-07-14 DIAGNOSIS — I130 Hypertensive heart and chronic kidney disease with heart failure and stage 1 through stage 4 chronic kidney disease, or unspecified chronic kidney disease: Secondary | ICD-10-CM

## 2022-07-14 DIAGNOSIS — N1832 Chronic kidney disease, stage 3b: Secondary | ICD-10-CM

## 2022-07-14 DIAGNOSIS — I5023 Acute on chronic systolic (congestive) heart failure: Secondary | ICD-10-CM

## 2022-07-14 DIAGNOSIS — L89896 Pressure-induced deep tissue damage of other site: Secondary | ICD-10-CM

## 2022-08-13 ENCOUNTER — Emergency Department (HOSPITAL_COMMUNITY): Payer: Medicare HMO

## 2022-08-13 ENCOUNTER — Other Ambulatory Visit: Payer: Self-pay

## 2022-08-13 ENCOUNTER — Inpatient Hospital Stay (HOSPITAL_COMMUNITY)
Admission: EM | Admit: 2022-08-13 | Discharge: 2022-08-23 | DRG: 464 | Disposition: A | Discharge status: Skilled Nursing Facility | Payer: Medicare HMO | Attending: Family Medicine | Admitting: Family Medicine

## 2022-08-13 ENCOUNTER — Observation Stay (HOSPITAL_COMMUNITY): Payer: Medicare HMO

## 2022-08-13 ENCOUNTER — Encounter (HOSPITAL_COMMUNITY): Payer: Self-pay | Admitting: Internal Medicine

## 2022-08-13 DIAGNOSIS — I130 Hypertensive heart and chronic kidney disease with heart failure and stage 1 through stage 4 chronic kidney disease, or unspecified chronic kidney disease: Secondary | ICD-10-CM | POA: Diagnosis present

## 2022-08-13 DIAGNOSIS — I5042 Chronic combined systolic (congestive) and diastolic (congestive) heart failure: Secondary | ICD-10-CM | POA: Diagnosis present

## 2022-08-13 DIAGNOSIS — L89619 Pressure ulcer of right heel, unspecified stage: Secondary | ICD-10-CM | POA: Insufficient documentation

## 2022-08-13 DIAGNOSIS — M86671 Other chronic osteomyelitis, right ankle and foot: Secondary | ICD-10-CM | POA: Diagnosis present

## 2022-08-13 DIAGNOSIS — M86271 Subacute osteomyelitis, right ankle and foot: Principal | ICD-10-CM | POA: Diagnosis present

## 2022-08-13 DIAGNOSIS — Z602 Problems related to living alone: Secondary | ICD-10-CM | POA: Diagnosis present

## 2022-08-13 DIAGNOSIS — I480 Paroxysmal atrial fibrillation: Secondary | ICD-10-CM | POA: Insufficient documentation

## 2022-08-13 DIAGNOSIS — Z79899 Other long term (current) drug therapy: Secondary | ICD-10-CM

## 2022-08-13 DIAGNOSIS — M19071 Primary osteoarthritis, right ankle and foot: Secondary | ICD-10-CM | POA: Diagnosis present

## 2022-08-13 DIAGNOSIS — L89610 Pressure ulcer of right heel, unstageable: Secondary | ICD-10-CM | POA: Diagnosis present

## 2022-08-13 DIAGNOSIS — N1831 Chronic kidney disease, stage 3a: Secondary | ICD-10-CM | POA: Diagnosis present

## 2022-08-13 DIAGNOSIS — B965 Pseudomonas (aeruginosa) (mallei) (pseudomallei) as the cause of diseases classified elsewhere: Secondary | ICD-10-CM | POA: Diagnosis present

## 2022-08-13 DIAGNOSIS — I4821 Permanent atrial fibrillation: Secondary | ICD-10-CM | POA: Diagnosis present

## 2022-08-13 DIAGNOSIS — L089 Local infection of the skin and subcutaneous tissue, unspecified: Secondary | ICD-10-CM

## 2022-08-13 DIAGNOSIS — I429 Cardiomyopathy, unspecified: Secondary | ICD-10-CM | POA: Diagnosis present

## 2022-08-13 DIAGNOSIS — N179 Acute kidney failure, unspecified: Secondary | ICD-10-CM | POA: Diagnosis not present

## 2022-08-13 DIAGNOSIS — I96 Gangrene, not elsewhere classified: Secondary | ICD-10-CM | POA: Diagnosis present

## 2022-08-13 DIAGNOSIS — I5040 Unspecified combined systolic (congestive) and diastolic (congestive) heart failure: Secondary | ICD-10-CM | POA: Diagnosis present

## 2022-08-13 DIAGNOSIS — L03115 Cellulitis of right lower limb: Secondary | ICD-10-CM | POA: Diagnosis present

## 2022-08-13 DIAGNOSIS — E876 Hypokalemia: Secondary | ICD-10-CM | POA: Diagnosis present

## 2022-08-13 DIAGNOSIS — I2510 Atherosclerotic heart disease of native coronary artery without angina pectoris: Secondary | ICD-10-CM | POA: Diagnosis present

## 2022-08-13 DIAGNOSIS — L97412 Non-pressure chronic ulcer of right heel and midfoot with fat layer exposed: Secondary | ICD-10-CM

## 2022-08-13 DIAGNOSIS — G629 Polyneuropathy, unspecified: Secondary | ICD-10-CM | POA: Diagnosis present

## 2022-08-13 DIAGNOSIS — M869 Osteomyelitis, unspecified: Secondary | ICD-10-CM | POA: Diagnosis present

## 2022-08-13 DIAGNOSIS — E860 Dehydration: Secondary | ICD-10-CM | POA: Diagnosis present

## 2022-08-13 DIAGNOSIS — I10 Essential (primary) hypertension: Secondary | ICD-10-CM | POA: Diagnosis present

## 2022-08-13 DIAGNOSIS — H9190 Unspecified hearing loss, unspecified ear: Secondary | ICD-10-CM

## 2022-08-13 DIAGNOSIS — Z608 Other problems related to social environment: Secondary | ICD-10-CM | POA: Diagnosis present

## 2022-08-13 DIAGNOSIS — M009 Pyogenic arthritis, unspecified: Secondary | ICD-10-CM | POA: Insufficient documentation

## 2022-08-13 DIAGNOSIS — K5900 Constipation, unspecified: Secondary | ICD-10-CM | POA: Diagnosis not present

## 2022-08-13 LAB — PROTIME-INR
INR: 1.2 (ref 0.8–1.2)
Prothrombin Time: 14.8 seconds (ref 11.4–15.2)

## 2022-08-13 LAB — BASIC METABOLIC PANEL
Anion gap: 15 (ref 5–15)
BUN: 68 mg/dL — ABNORMAL HIGH (ref 8–23)
CO2: 22 mmol/L (ref 22–32)
Calcium: 8.6 mg/dL — ABNORMAL LOW (ref 8.9–10.3)
Chloride: 100 mmol/L (ref 98–111)
Creatinine, Ser: 2.56 mg/dL — ABNORMAL HIGH (ref 0.61–1.24)
GFR, Estimated: 23 mL/min — ABNORMAL LOW (ref >60)
Glucose, Bld: 107 mg/dL — ABNORMAL HIGH (ref 70–99)
Potassium: 3.4 mmol/L — ABNORMAL LOW (ref 3.5–5.1)
Sodium: 137 mmol/L (ref 135–145)

## 2022-08-13 LAB — CBC WITH DIFFERENTIAL/PLATELET
Abs Immature Granulocytes: 0.04 10*3/uL (ref 0.00–0.07)
Basophils Absolute: 0.1 10*3/uL (ref 0.0–0.1)
Basophils Relative: 1 %
Eosinophils Absolute: 0.1 10*3/uL (ref 0.0–0.5)
Eosinophils Relative: 1 %
HCT: 38.1 % — ABNORMAL LOW (ref 39.0–52.0)
Hemoglobin: 12.4 g/dL — ABNORMAL LOW (ref 13.0–17.0)
Immature Granulocytes: 0 %
Lymphocytes Relative: 16 %
Lymphs Abs: 1.5 10*3/uL (ref 0.7–4.0)
MCH: 33.5 pg (ref 26.0–34.0)
MCHC: 32.5 g/dL (ref 30.0–36.0)
MCV: 103 fL — ABNORMAL HIGH (ref 80.0–100.0)
Monocytes Absolute: 1.1 10*3/uL — ABNORMAL HIGH (ref 0.1–1.0)
Monocytes Relative: 12 %
Neutro Abs: 6.5 10*3/uL (ref 1.7–7.7)
Neutrophils Relative %: 70 %
Platelets: 246 10*3/uL (ref 150–400)
RBC: 3.7 MIL/uL — ABNORMAL LOW (ref 4.22–5.81)
RDW: 14.2 % (ref 11.5–15.5)
WBC: 9.3 10*3/uL (ref 4.0–10.5)
nRBC: 0 % (ref 0.0–0.2)

## 2022-08-13 LAB — C-REACTIVE PROTEIN: CRP: 3.8 mg/dL — ABNORMAL HIGH (ref <1.0)

## 2022-08-13 LAB — SEDIMENTATION RATE: Sed Rate: 30 mm/hr — ABNORMAL HIGH (ref 0–16)

## 2022-08-13 MED ORDER — SODIUM CHLORIDE 0.9 % IV BOLUS
1000.0000 mL | Freq: Once | INTRAVENOUS | Status: AC
  Administered 2022-08-13: 1000 mL via INTRAVENOUS

## 2022-08-13 MED ORDER — CIPROFLOXACIN HCL 500 MG PO TABS
500.0000 mg | ORAL_TABLET | Freq: Every day | ORAL | Status: DC
  Administered 2022-08-14 (×2): 500 mg via ORAL
  Filled 2022-08-13 (×2): qty 1

## 2022-08-13 MED ORDER — GADOBUTROL 1 MMOL/ML IV SOLN
8.0000 mL | Freq: Once | INTRAVENOUS | Status: AC | PRN
  Administered 2022-08-13: 8 mL via INTRAVENOUS

## 2022-08-13 NOTE — Assessment & Plan Note (Addendum)
Patient started on Ciprofloxacin secondary to outpatient cultures growing pseudomonas. MRI obtained this admission and was significant for septic arthritis of first MTP joint with osteomyelitis of medial sesamoid and early osteomyelitis of the periarticular first metatarsal head and proximal phalanx. Podiatry consulted and performed I&D with excisional debridement on 08/16/22. ID consulted and have recommended Ciprofloxacin 500 mg (renally dosed from original 750 mg recommendation) for a total of 6 weeks of treatment from date of surgery. Continue Ciprofloxacin 500 mg BID x6 weeks. End date 09/28/2022

## 2022-08-13 NOTE — Assessment & Plan Note (Addendum)
Creatinine of 2.56 on admission. Resolved with IV fluids.

## 2022-08-13 NOTE — Assessment & Plan Note (Addendum)
Baseline creatinine of about 1.6.

## 2022-08-13 NOTE — Subjective & Objective (Signed)
CC: right foot wound HPI: 89 yo WM with hx of PAF(not on AC due to fall risk), HTN, CKD stage 3a, with chronic osteomyelitis of the right calcaneus and recent osteomyelitis of the first metatarsal head, presents to the ER today.  He states that he has been followed at wound care clinic for some time now.  He has been changing his dressings twice a day on his feet.  He lives at home by himself.  His wife of many years died a couple years ago.  He has no social support system at home.  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  He states that his wound care doctor at [facility] directed him to come to [facility] for evaluation.  In fact all of his care is performed at [facility].  He lives in [place].  His last office visit wound care clinic was on 18 January.  During that visit, his previous wound cultures grew Pseudomonas aeruginosa.  This was sensitive to Cipro.  He also had x-rays taken which showed osteomyelitis of the calcaneus.  He was placed empirically on Doxy the week before.  Wound care doctor documents that the patient has exposed bone of the right first metatarsal head.  Patient is walking on the bone.  There is no signs of acute soft tissue infection.  His wound care doctor wanted him to get a MRI and to be nonweightbearing on the right leg.  This is difficult for the patient as he lives by himself.  He was referred to infectious disease and podiatry but is not yet seen them.  He has good peripheral pulses.  On arrival temp 97.6 heart rate 88 blood pressure 115/64 satting 99% on room air.  Labs show white count 9.3, hemoglobin 12.4, platelets of 246  Sodium 137, potassium 3.4, BUN of 68, creatinine 2.5  Right foot x-ray demonstrates subtle osteomyelitis of the plantar aspect of the posterior calcaneus similar to prior x-rays.  [facility] contacted for admission.

## 2022-08-13 NOTE — H&P (Signed)
History and Physical    Hector Vazquez KXF:[id number] DOB: 05/12/1930 DOA: 08/13/2022  DOS: the patient was seen and examined on 08/13/2022  PCP: Garwin Brothers, MD   Patient coming from: Home  I have personally briefly reviewed patient's old medical records in [place]  CC: right foot wound HPI: 89 yo WM with hx of PAF(not on AC due to fall risk), HTN, CKD stage 3a, with chronic osteomyelitis of the right calcaneus and recent osteomyelitis of the first metatarsal head, presents to the ER today.  He states that he has been followed at wound care clinic for some time now.  He has been changing his dressings twice a day on his feet.  He lives at home by himself.  His wife of many years died a couple years ago.  He has no social support system at home.  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  He states that his wound care doctor at [facility] directed him to come to [facility] for evaluation.  In fact all of his care is performed at [place] in [facility].  He lives in [place].  His last office visit wound care clinic was on 18 January.  During that visit, his previous wound cultures grew Pseudomonas aeruginosa.  This was sensitive to Cipro.  He also had x-rays taken which showed osteomyelitis of the calcaneus.  He was placed empirically on Doxy the week before.  Wound care doctor documents that the patient has exposed bone of the right first metatarsal head.  Patient is walking on the bone.  There is no signs of acute soft tissue infection.  His wound care doctor wanted him to get a MRI and to be nonweightbearing on the right leg.  This is difficult for the patient as he lives by himself.  He was referred to infectious disease and podiatry but is not yet seen them.  He has good peripheral pulses.  On arrival temp 97.6 heart rate 88 blood pressure 115/64 satting 99% on room air.  Labs show white count 9.3, hemoglobin 12.4, platelets of 246  Sodium 137,  potassium 3.4, BUN of 68, creatinine 2.5  Right foot x-ray demonstrates subtle osteomyelitis of the plantar aspect of the posterior calcaneus similar to prior x-rays.  [facility] hospitalist contacted for admission.   ED Course: mild AKI. Scr 2.56. baseline 1.6.  WBC 9.3  Review of Systems:  Review of Systems  Constitutional: Negative.   HENT: Negative.    Eyes: Negative.   Respiratory: Negative.    Cardiovascular: Negative.   Gastrointestinal: Negative.   Genitourinary: Negative.   Musculoskeletal: Negative.   Skin:        Chronic wound on right plantar surface of foot.  Neurological:  Positive for weakness.  Endo/Heme/Allergies: Negative.   Psychiatric/Behavioral: Negative.    All other systems reviewed and are negative.   Past Medical History:  Diagnosis Date   Coronary artery disease     History reviewed. No pertinent surgical history.   reports that he has never smoked. He has never used smokeless tobacco. He reports that he does not drink alcohol and does not use drugs.  No Known Allergies  No family history on file.  Prior to Admission medications   Medication Sig Start Date End Date Taking? Authorizing Provider  b complex vitamins capsule Take 1 capsule by mouth daily. 09/03/18   [provider]  Cholecalciferol 25 MCG (1000 UT) capsule Take 1,000 Units  by mouth daily. 11/17/13   [provider]  diclofenac Sodium (VOLTAREN) 1 % GEL Apply 4 g topically 4 (four) times daily. Patient not taking: Reported on 06/20/2022 12/18/21   Melene Plan, DO  lovastatin (MEVACOR) 20 MG tablet Take 20 mg by mouth daily. 12/12/20   [provider]  metoprolol succinate (TOPROL-XL) 25 MG 24 hr tablet Take 25 mg by mouth daily. 02/17/21   [provider]  midodrine (PROAMATINE) 5 MG tablet Take 1 tablet (5 mg total) by mouth 3 (three) times daily with meals. 07/02/22   Tyrone Nine, MD  warfarin (COUMADIN) 2.5 MG tablet Take 1.25-2.5 mg by mouth See admin  instructions. Pt takes 2.5mg  every day of the week except for Thursdays, patient takes 1.25mg . 02/03/21   [provider]    Physical Exam: Vitals:   08/13/22 1956 08/13/22 2000 08/13/22 2030 08/13/22 2130  BP:  115/62 98/63 105/74  Pulse:  88 95 97  Resp:  15 (!) 25 17  Temp:  97.6 F (36.4 C)    SpO2:  99% 98% 97%  Weight: 82 kg     Height: 6\' 3"  (1.905 m)       Physical Exam Vitals and nursing note reviewed.  Constitutional:      Comments: Elderly male. No distress Hair is neatly groomed  HENT:     Head: Normocephalic and atraumatic.     Nose: Nose normal.  Cardiovascular:     Rate and Rhythm: Normal rate and regular rhythm.     Pulses: Normal pulses.  Pulmonary:     Effort: Pulmonary effort is normal.     Breath sounds: Normal breath sounds.  Abdominal:     General: Bowel sounds are normal. There is no distension.     Palpations: Abdomen is soft.  Musculoskeletal:     Right lower leg: Edema present.     Left lower leg: Edema present.     Comments: Bilateral pretibial edema +1-2. Chronic venous stasis changes.    Skin:    Comments: Ulceration at ball of right foot. Exposed bone. Surrounding wound base is dry. No erythema.  Purple coloration on right heel. Pt states this is a medication that wound care clinic applied to his right heel wound  Neurological:     Mental Status: He is alert and oriented to person, place, and time.     Comments: Hard of hearing      Labs on Admission: I have personally reviewed following labs and imaging studies  CBC: Recent Labs  Lab 08/13/22 2008  WBC 9.3  NEUTROABS 6.5  HGB 12.4*  HCT 38.1*  MCV 103.0*  PLT 246   Basic Metabolic Panel: Recent Labs  Lab 08/13/22 2008  NA 137  K 3.4*  CL 100  CO2 22  GLUCOSE 107*  BUN 68*  CREATININE 2.56*  CALCIUM 8.6*   GFR: Estimated Creatinine Clearance: 22.2 mL/min (A) (by C-G formula based on SCr of 2.56 mg/dL (H)). Liver Function Tests: No results for input(s):  "AST", "ALT", "ALKPHOS", "BILITOT", "PROT", "ALBUMIN" in the last 168 hours. No results for input(s): "LIPASE", "AMYLASE" in the last 168 hours. No results for input(s): "AMMONIA" in the last 168 hours. Coagulation Profile: Recent Labs  Lab 08/13/22 2008  INR 1.2   Cardiac Enzymes: No results for input(s): "CKTOTAL", "CKMB", "CKMBINDEX", "TROPONINI", "TROPONINIHS" in the last 168 hours. BNP (last 3 results) No results for input(s): "PROBNP" in the last 8760 hours. HbA1C: No results for input(s): "HGBA1C"  in the last 72 hours. CBG: No results for input(s): "GLUCAP" in the last 168 hours. Lipid Profile: No results for input(s): "CHOL", "HDL", "LDLCALC", "TRIG", "CHOLHDL", "LDLDIRECT" in the last 72 hours. Thyroid Function Tests: No results for input(s): "TSH", "T4TOTAL", "FREET4", "T3FREE", "THYROIDAB" in the last 72 hours. Anemia Panel: No results for input(s): "VITAMINB12", "FOLATE", "FERRITIN", "TIBC", "IRON", "RETICCTPCT" in the last 72 hours. Urine analysis:    Component Value Date/Time   COLORURINE YELLOW 06/20/2022 [place] 06/20/2022 1407   LABSPEC 1.012 06/20/2022 1407   PHURINE 6.0 06/20/2022 1407   GLUCOSEU NEGATIVE 06/20/2022 1407   HGBUR MODERATE (A) 06/20/2022 1407   BILIRUBINUR NEGATIVE 06/20/2022 1407   KETONESUR 5 (A) 06/20/2022 1407   PROTEINUR NEGATIVE 06/20/2022 1407   NITRITE NEGATIVE 06/20/2022 1407   LEUKOCYTESUR NEGATIVE 06/20/2022 1407    Radiological Exams on Admission: I have personally reviewed images DG Foot Complete Right  Result Date: 08/13/2022 CLINICAL DATA:  Plantar ulcers at heel and first metatarsal EXAM: RIGHT FOOT COMPLETE - 3+ VIEW COMPARISON:  1124 FINDINGS: Osseous demineralization. Advanced degenerative changes of the ankle joint. Remaining joint spaces preserved. Questionable loss of cortical margination at the plantar aspect of the posterior calcaneus, concerning for subtle osteomyelitis. No acute fracture,  dislocation, or additional bone destruction. Specifically, no definite bone destruction is seen at the plantar aspect of the first MTP joint at site of ulcer. IMPRESSION: Question subtle osteomyelitis of the plantar surface of the posterior calcaneus, similar to prior exam; this could be better assessed by MR. Advanced degenerative changes at ankle. Electronically Signed   By: Lavonia Dana M.D.   On: 08/13/2022 20:43    EKG: My personal interpretation of EKG shows: afib      Assessment/Plan Principal Problem:   Subacute osteomyelitis of right foot (HCC) Active Problems:   AKI (acute kidney injury) ([place])   Essential hypertension   Stage 3a chronic kidney disease (CKD) (HCC) - baseline SCr 1.6   Unspecified hearing loss, unspecified ear   Heart failure with reduced ejection fraction and diastolic dysfunction (HCC)   PAF (paroxysmal atrial fibrillation) (HCC)    Assessment and Plan: * Subacute osteomyelitis of right foot ([place]) Observation med/surg bed. Wound cultures from Wound clinic grew pansensitive pseudomonas. Pt without leukocytosis. Wound clinic had ordered outpatient MRI right foot to better evaluate osteo of right forefoot and right heel. Will obtain MRI. Start po cipro. Pt with good distal pedal pulses. I think pt's biggest problem is that he lives alone. He cannot be NWB on his left foot as he only as a limited hours with a caretaker. He has poor social support structure. He will likely need several weeks of abx. Maybe short term SNF vs ALF placement. Will need TOC consult to assess.  AKI (acute kidney injury) (HCC) Mild. Baseline Scr 1.6.  start gentle IVF hydration. Avoid nephrotoxic agents.  Heart failure with reduced ejection fraction and diastolic dysfunction (HCC) Mildly dehydration. LVEF 40-45% by echo 05-2022. Has cardiologist in [place] with [facility]. In fact, all his medical care is at [place]. Pt states his [facility] Wound clinic doctor told him to come to [facility] for  care instead of [facility].  Unspecified hearing loss, unspecified ear Chronic.  Stage 3a chronic kidney disease (CKD) (HCC) - baseline SCr 1.6 Acutely worse due to dehydration. Start IVF. Monitor Scr.  Essential hypertension Stable.  PAF (paroxysmal atrial fibrillation) ([place]) Not taking coumadin any longer. Presumably due to high fall risk. Continue  toprol-xl.   DVT prophylaxis: SQ Heparin Code Status: Full Code Family Communication: no family at bedside  Disposition Plan: return home with [facility] vs SNF  Consults called: none  Admission status: Observation, Med-Surg   Carollee Herter, DO [facility] 08/13/2022, 10:40 PM

## 2022-08-13 NOTE — ED Triage Notes (Signed)
Patient BIB EMS from home c/o infection. Patient report his Primary care office called him today to come in because of abnormal blood work and X rays that was done yesterday. Per report pt had osteomyelitis on his right foot. Pt denies N/V. Pt a/ox4.

## 2022-08-13 NOTE — ED Provider Notes (Signed)
[place] EMERGENCY DEPARTMENT AT [facility] Provider Note   CSN: [id number] Arrival date & time: 08/13/22  1946     History  Chief Complaint  Patient presents with   Osteomyelitis     Hector Vazquez is a 89 y.o. male.  Patient here with concern for right foot osteomyelitis.  He has had a chronic right foot wound for several years that is being followed by wound care team at [facility].  He had some imaging done recently and blood work done recently and they were concerned that he needed immediate workup for osteomyelitis.  Per his preference he came here for evaluation.  He denies any fevers.  He states that he has been on antibiotic for the last week plus for thought that there was acute infection going on the right foot.  Sounds like he is on ciprofloxacin.  He has a history of A-fib but no longer on anticoagulation.  Denies history of diabetes.  Has neuropathy in his feet.  Denies any fevers or chills.  Denies any nausea or vomiting.  Denies any trauma.  The history is provided by the patient.       Home Medications Prior to Admission medications   Medication Sig Start Date End Date Taking? Authorizing Provider  b complex vitamins capsule Take 1 capsule by mouth daily. 09/03/18   [provider]  Cholecalciferol 25 MCG (1000 UT) capsule Take 1,000 Units by mouth daily. 11/17/13   [provider]  diclofenac Sodium (VOLTAREN) 1 % GEL Apply 4 g topically 4 (four) times daily. Patient not taking: Reported on 06/20/2022 12/18/21   Deno Etienne, DO  lovastatin (MEVACOR) 20 MG tablet Take 20 mg by mouth daily. 12/12/20   [provider]  metoprolol succinate (TOPROL-XL) 25 MG 24 hr tablet Take 25 mg by mouth daily. 02/17/21   [provider]  midodrine (PROAMATINE) 5 MG tablet Take 1 tablet (5 mg total) by mouth 3 (three) times daily with meals. 07/02/22   Patrecia Pour, MD  warfarin (COUMADIN) 2.5 MG tablet Take 1.25-2.5 mg by mouth See  admin instructions. Pt takes 2.5mg  every day of the week except for Thursdays, patient takes 1.25mg . 02/03/21   [provider]      Allergies    Patient has no known allergies.    Review of Systems   Review of Systems  Physical Exam Updated Vital Signs BP 98/63   Pulse 95   Temp 97.6 F (36.4 C)   Resp (!) 25   Ht 6\' 3"  (1.905 m)   Wt 82 kg   SpO2 98%   BMI 22.60 kg/m  Physical Exam Vitals and nursing note reviewed.  Constitutional:      General: He is not in acute distress.    Appearance: He is well-developed. He is not ill-appearing.  HENT:     Head: Normocephalic and atraumatic.  Eyes:     Conjunctiva/sclera: Conjunctivae normal.  Cardiovascular:     Rate and Rhythm: Normal rate and regular rhythm.     Pulses: Normal pulses.     Heart sounds: Normal heart sounds. No murmur heard. Pulmonary:     Effort: Pulmonary effort is normal. No respiratory distress.     Breath sounds: Normal breath sounds.  Abdominal:     Palpations: Abdomen is soft.     Tenderness: There is no abdominal tenderness.  Musculoskeletal:        General: No swelling.     Cervical back: Neck  supple.  Skin:    General: Skin is warm and dry.     Capillary Refill: Capillary refill takes less than 2 seconds.     Comments: He is got some redness to the right shin but overall there is no major redness to the right foot, there are 2 ulcers on the bottom of his right foot that appear to be fairly deep and possibly with some exposed bone, no obvious purulent drainage  Neurological:     General: No focal deficit present.     Mental Status: He is alert.  Psychiatric:        Mood and Affect: Mood normal.     ED Results / Procedures / Treatments   Labs (all labs ordered are listed, but only abnormal results are displayed) Labs Reviewed  CBC WITH DIFFERENTIAL/PLATELET - Abnormal; Notable for the following components:      Result Value   RBC 3.70 (*)    Hemoglobin 12.4 (*)    HCT 38.1 (*)     MCV 103.0 (*)    Monocytes Absolute 1.1 (*)    All other components within normal limits  BASIC METABOLIC PANEL - Abnormal; Notable for the following components:   Potassium 3.4 (*)    Glucose, Bld 107 (*)    BUN 68 (*)    Creatinine, Ser 2.56 (*)    Calcium 8.6 (*)    GFR, Estimated 23 (*)    All other components within normal limits  PROTIME-INR  C-REACTIVE PROTEIN  SEDIMENTATION RATE    EKG None  Radiology DG Foot Complete Right  Result Date: 08/13/2022 CLINICAL DATA:  Plantar ulcers at heel and first metatarsal EXAM: RIGHT FOOT COMPLETE - 3+ VIEW COMPARISON:  1124 FINDINGS: Osseous demineralization. Advanced degenerative changes of the ankle joint. Remaining joint spaces preserved. Questionable loss of cortical margination at the plantar aspect of the posterior calcaneus, concerning for subtle osteomyelitis. No acute fracture, dislocation, or additional bone destruction. Specifically, no definite bone destruction is seen at the plantar aspect of the first MTP joint at site of ulcer. IMPRESSION: Question subtle osteomyelitis of the plantar surface of the posterior calcaneus, similar to prior exam; this could be better assessed by MR. Advanced degenerative changes at ankle. Electronically Signed   By: Lavonia Dana M.D.   On: 08/13/2022 20:43    Procedures Procedures    Medications Ordered in ED Medications  sodium chloride 0.9 % bolus 1,000 mL (has no administration in time range)    ED Course/ Medical Decision Making/ A&P                             Medical Decision Making Amount and/or Complexity of Data Reviewed Labs: ordered. Radiology: ordered.  Risk Decision regarding hospitalization.   Hector Vazquez is here because he was sent by his primary care doctor for concern for osteomyelitis of his right foot.  Per my chart review it appears that he had a foot x-ray around 11 days ago that showed signs concerning for osteomyelitis of the right foot.  He had elevated CRP  and ESR at outpatient clinic.  Was sent for further workup.  He denies any fevers or chills.  He is currently on ciprofloxacin for antibiotics.  He has a history of A-fib but no longer anticoagulation.  No other major medical problems.  Denies diabetes.  He is got strong pulses in his lower extremities.  Overall he is got a chronic appearing ulcer  x 2 on the bottom of his right foot, no obvious purulent drainage or major redness.  Will recheck labs including CBC and BMP and ESR and CRP.  Will get an x-ray of the right foot.  X-ray per radiology report with concern for osteomyelitis of the right foot as well.  Continues to have AKI with a creatinine of 2.56.  Suspect maybe this is some dehydration.  Will give him a fluid bolus.  He does not have significant leukocytosis or anemia.  ESR and CRP are pending but have already been elevated outpatient from blood work couple days ago.  Ultimately think he benefit from hydration, MRI to see if he has osteomyelitis as he may need more aggressive care.  This chart was dictated using voice recognition software.  Despite best efforts to proofread,  errors can occur which can change the documentation meaning.         Final Clinical Impression(s) / ED Diagnoses Final diagnoses:  AKI (acute kidney injury) (HCC)  Right foot infection    Rx / DC Orders ED Discharge Orders     None         Virgina Norfolk, DO 08/13/22 2104

## 2022-08-13 NOTE — Assessment & Plan Note (Addendum)
Patient is no longer on Coumadin for anticoagulation presumably secondary to fall risk. He is on diltiazem and Toprol XL. On thorough chart review, patient was last seen by his Cardiologist on 03/14/22 with history of preserved EF. Patient recently discharged from hospital on 07/02/22 with newly reduced EF of 40-45% and diltiazem held at that time. Rate is controlled this admission on Toprol XL. -Continue Toprol XL; increase as needed -Outpatient cardiology follow-up

## 2022-08-13 NOTE — Assessment & Plan Note (Addendum)
Continue metoprolol succinate 12.5 mg daily

## 2022-08-13 NOTE — Assessment & Plan Note (Signed)
Chronic. 

## 2022-08-13 NOTE — Assessment & Plan Note (Addendum)
Stable and without evidence of acute overload. Patient is on metoprolol succinate and torsemide as an outpatient. It appears patient is on diltiazem as an outpatient for his atrial fibrillation which was held on admission. -Continue metoprolol succinate

## 2022-08-13 NOTE — ED Notes (Signed)
Patient transported to MRI 

## 2022-08-14 ENCOUNTER — Encounter (HOSPITAL_COMMUNITY): Payer: Self-pay | Admitting: Internal Medicine

## 2022-08-14 ENCOUNTER — Observation Stay (HOSPITAL_BASED_OUTPATIENT_CLINIC_OR_DEPARTMENT_OTHER): Payer: Medicare HMO

## 2022-08-14 DIAGNOSIS — L0390 Cellulitis, unspecified: Secondary | ICD-10-CM

## 2022-08-14 DIAGNOSIS — H9190 Unspecified hearing loss, unspecified ear: Secondary | ICD-10-CM

## 2022-08-14 DIAGNOSIS — M86271 Subacute osteomyelitis, right ankle and foot: Secondary | ICD-10-CM | POA: Diagnosis not present

## 2022-08-14 DIAGNOSIS — N179 Acute kidney failure, unspecified: Secondary | ICD-10-CM | POA: Diagnosis not present

## 2022-08-14 DIAGNOSIS — I480 Paroxysmal atrial fibrillation: Secondary | ICD-10-CM | POA: Diagnosis not present

## 2022-08-14 DIAGNOSIS — L089 Local infection of the skin and subcutaneous tissue, unspecified: Secondary | ICD-10-CM

## 2022-08-14 LAB — CBC WITH DIFFERENTIAL/PLATELET
Abs Immature Granulocytes: 0.04 10*3/uL (ref 0.00–0.07)
Basophils Absolute: 0 10*3/uL (ref 0.0–0.1)
Basophils Relative: 1 %
Eosinophils Absolute: 0.1 10*3/uL (ref 0.0–0.5)
Eosinophils Relative: 2 %
HCT: 34.8 % — ABNORMAL LOW (ref 39.0–52.0)
Hemoglobin: 11.5 g/dL — ABNORMAL LOW (ref 13.0–17.0)
Immature Granulocytes: 1 %
Lymphocytes Relative: 22 %
Lymphs Abs: 1.7 10*3/uL (ref 0.7–4.0)
MCH: 33.8 pg (ref 26.0–34.0)
MCHC: 33 g/dL (ref 30.0–36.0)
MCV: 102.4 fL — ABNORMAL HIGH (ref 80.0–100.0)
Monocytes Absolute: 0.9 10*3/uL (ref 0.1–1.0)
Monocytes Relative: 12 %
Neutro Abs: 4.9 10*3/uL (ref 1.7–7.7)
Neutrophils Relative %: 62 %
Platelets: 218 10*3/uL (ref 150–400)
RBC: 3.4 MIL/uL — ABNORMAL LOW (ref 4.22–5.81)
RDW: 14.4 % (ref 11.5–15.5)
WBC: 7.7 10*3/uL (ref 4.0–10.5)
nRBC: 0 % (ref 0.0–0.2)

## 2022-08-14 LAB — COMPREHENSIVE METABOLIC PANEL
ALT: 15 U/L (ref 0–44)
AST: 20 U/L (ref 15–41)
Albumin: 2.8 g/dL — ABNORMAL LOW (ref 3.5–5.0)
Alkaline Phosphatase: 29 U/L — ABNORMAL LOW (ref 38–126)
Anion gap: 11 (ref 5–15)
BUN: 51 mg/dL — ABNORMAL HIGH (ref 8–23)
CO2: 26 mmol/L (ref 22–32)
Calcium: 8 mg/dL — ABNORMAL LOW (ref 8.9–10.3)
Chloride: 103 mmol/L (ref 98–111)
Creatinine, Ser: 2.13 mg/dL — ABNORMAL HIGH (ref 0.61–1.24)
GFR, Estimated: 29 mL/min — ABNORMAL LOW (ref >60)
Glucose, Bld: 93 mg/dL (ref 70–99)
Potassium: 3.1 mmol/L — ABNORMAL LOW (ref 3.5–5.1)
Sodium: 140 mmol/L (ref 135–145)
Total Bilirubin: 0.6 mg/dL (ref 0.3–1.2)
Total Protein: 5.7 g/dL — ABNORMAL LOW (ref 6.5–8.1)

## 2022-08-14 LAB — MAGNESIUM: Magnesium: 2.4 mg/dL (ref 1.7–2.4)

## 2022-08-14 LAB — VAS US ABI WITH/WO TBI
Left ABI: 1.25
Right ABI: 1.25

## 2022-08-14 LAB — PHOSPHORUS: Phosphorus: 2.9 mg/dL (ref 2.5–4.6)

## 2022-08-14 MED ORDER — SODIUM CHLORIDE 0.9 % IV SOLN
2.0000 g | INTRAVENOUS | Status: DC
  Administered 2022-08-14: 2 g via INTRAVENOUS
  Filled 2022-08-14: qty 12.5

## 2022-08-14 MED ORDER — ALBUMIN HUMAN 25 % IV SOLN
25.0000 g | Freq: Once | INTRAVENOUS | Status: AC
  Administered 2022-08-14: 25 g via INTRAVENOUS
  Filled 2022-08-14: qty 100

## 2022-08-14 MED ORDER — HEPARIN SODIUM (PORCINE) 5000 UNIT/ML IJ SOLN
5000.0000 [IU] | Freq: Three times a day (TID) | INTRAMUSCULAR | Status: DC
  Administered 2022-08-14 – 2022-08-23 (×26): 5000 [IU] via SUBCUTANEOUS
  Filled 2022-08-14 (×26): qty 1

## 2022-08-14 MED ORDER — ONDANSETRON HCL 4 MG/2ML IJ SOLN: 4.0000 mg | Freq: Four times a day (QID) | INTRAMUSCULAR | Status: DC | PRN

## 2022-08-14 MED ORDER — ASPIRIN 81 MG PO TBEC
81.0000 mg | DELAYED_RELEASE_TABLET | Freq: Every day | ORAL | Status: DC
  Administered 2022-08-14 – 2022-08-22 (×9): 81 mg via ORAL
  Filled 2022-08-14 (×9): qty 1

## 2022-08-14 MED ORDER — ACETAMINOPHEN 325 MG PO TABS
650.0000 mg | ORAL_TABLET | Freq: Four times a day (QID) | ORAL | Status: DC | PRN
  Administered 2022-08-17 – 2022-08-19 (×3): 650 mg via ORAL
  Filled 2022-08-14 (×3): qty 2

## 2022-08-14 MED ORDER — METOPROLOL SUCCINATE ER 25 MG PO TB24
25.0000 mg | ORAL_TABLET | Freq: Every day | ORAL | Status: DC
  Administered 2022-08-14: 25 mg via ORAL
  Filled 2022-08-14: qty 1

## 2022-08-14 MED ORDER — ACETAMINOPHEN 650 MG RE SUPP: 650.0000 mg | Freq: Four times a day (QID) | RECTAL | Status: DC | PRN

## 2022-08-14 MED ORDER — ONDANSETRON HCL 4 MG PO TABS: 4.0000 mg | ORAL_TABLET | Freq: Four times a day (QID) | ORAL | Status: DC | PRN

## 2022-08-14 MED ORDER — POTASSIUM CHLORIDE CRYS ER 20 MEQ PO TBCR
40.0000 meq | EXTENDED_RELEASE_TABLET | Freq: Two times a day (BID) | ORAL | Status: AC
  Administered 2022-08-14 (×2): 40 meq via ORAL
  Filled 2022-08-14 (×2): qty 2

## 2022-08-14 MED ORDER — METOPROLOL SUCCINATE ER 25 MG PO TB24
12.5000 mg | ORAL_TABLET | Freq: Every day | ORAL | Status: DC
  Administered 2022-08-15 – 2022-08-23 (×9): 12.5 mg via ORAL
  Filled 2022-08-14 (×9): qty 1

## 2022-08-14 NOTE — Progress Notes (Signed)
Hector Vazquez ZOX:[id number] DOB: 1929-12-12 DOA: 08/13/2022 PCP: Garwin Brothers, MD   Subj: 89 yo WM with PMHx PAF(not on AC due to fall risk), HTN, CKD stage 3a, with chronic osteomyelitis of the right calcaneus and recent osteomyelitis of the first metatarsal head,   Presents to the ER today.  He states that he has been followed at wound care clinic for some time now.  He has been changing his dressings twice a day on his feet.  He lives at home by himself.  His wife of many years died a couple years ago.  He has no social support system at home.  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  He states that his wound care doctor at [place] directed him to come to [facility] for evaluation.  In fact all of his care is performed at [place] in [facility].  He lives in [place].   His last office visit wound care clinic was on 18 January.  During that visit, his previous wound cultures grew Pseudomonas aeruginosa.  This was sensitive to Cipro.  He also had x-rays taken which showed osteomyelitis of the calcaneus.  He was placed empirically on Doxy the week before.   Wound care doctor documents that the patient has exposed bone of the right first metatarsal head.  Patient is walking on the bone.  There is no signs of acute soft tissue infection.  His wound care doctor wanted him to get a MRI and to be nonweightbearing on the right leg.  This is difficult for the patient as he lives by himself.  He was referred to infectious disease and podiatry but is not yet seen them.  He has good peripheral pulses.   On arrival temp 97.6 heart rate 88 blood pressure 115/64 satting 99% on room air.   Labs show white count 9.3, hemoglobin 12.4, platelets of 246   Sodium 137, potassium 3.4, BUN of 68, creatinine 2.5   Right foot x-ray demonstrates subtle osteomyelitis of the plantar aspect of the posterior calcaneus similar to prior x-rays.   Obj: A/O x 4, states the Pseudomonas  culture from his foot was from the January 18 visit to his physician he was informed to come to the ED for admission.  Started on ciprofloxacin.  Had previously been in SNF then transition home.  Believes he was on doxycycline at that time.  Patient lives alone.  Support system consists of friends who have been present at his admission and are currently present.    Objective: VITAL SIGNS: Temp: 97.8 F (36.6 C) (01/23 0740) Temp Source: Oral (01/23 0740) BP: 98/53 (01/23 0740) Pulse Rate: 42 (01/23 0740)   VENTILATOR SETTINGS: **   Intake/Output Summary (Last 24 hours) at 08/14/2022 0741 Last data filed at 08/14/2022 0500 Gross per 24 hour  Intake --  Output 700 ml  Net -700 ml     Exam: Physical Exam:  General: A/O x 4, No acute respiratory distress Eyes: negative scleral hemorrhage, negative anisocoria, negative icterus ENT: Negative Runny nose, negative gingival bleeding, Neck:  Negative scars, masses, torticollis, lymphadenopathy, JVD Lungs: Clear to auscultation bilaterally without wheezes or crackles Cardiovascular: Regular rate and rhythm without murmur gallop or rub normal S1 and S2 Abdomen: negative abdominal pain, nondistended, positive soft, bowel sounds, no rebound, no ascites, no appreciable mass Extremities: No significant cyanosis, clubbing, or edema bilateral lower extremities Skin: See pictures of right foot below  Psychiatric:  Negative  depression, negative anxiety, negative fatigue, negative mania  Central nervous system:  Cranial nerves II through XII intact, tongue/uvula midline, all extremities muscle strength 5/5, sensation intact throughout, negative dysarthria, negative expressive aphasia, negative receptive aphasia.   .     Mobility Assessment (last 72 hours)     Mobility Assessment     Row Name 08/14/22 0100 08/14/22 0020         Does patient have an order for bedrest or is patient medically unstable No - Continue assessment No - Continue  assessment      What is the highest level of mobility based on the progressive mobility assessment? Level 5 (Walks with assist in room/hall) - Balance while stepping forward/back and can walk in room with assist - Complete Level 5 (Walks with assist in room/hall) - Balance while stepping forward/back and can walk in room with assist - Complete                 DVT prophylaxis: SQ Heparin Code Status: Full Code Family Communication: no family at bedside  Disposition Plan: return home with [facility] vs SNF               Patient currently is not medically stable to d/c.    Procedures/Significant Events: 1/23 MRI right ankle/Foot -Septic arthritis of the first MTP joint with osteomyelitis of the medial sesamoid and early osteomyelitis of the periarticular first metatarsal head and proximal phalanx. Adjacent soft tissue cellulitis and plantar ulcer. -Hindfoot valgus with findings of lateral hindfoot impingement. No convincing evidence of osteomyelitis in the hindfoot.  -Plantar heel soft tissue cellulitis with possible tiny sinus tract extending in close proximity to the central band plantar fascia. No evidence of a well-defined, drainable soft tissue abscess in the hindfoot. 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 -Severe tibiotalar osteoarthritis. Mild-to-moderate posterior middle subtalar osteoarthritis.  -Small tibiotalar and moderate posterior subtalar effusions. -Torn anterior talofibular ligament, calcaneofibular ligament, and deep deltoid ligament.   Consultants:  Podiatry Louann Sjogren ID Mayanka Thedore Mins   Cultures 1/23 right foot calcaneal ulcer pending   Antimicrobials: Anti-infectives (From admission, onward)    Start     Ordered Stop   08/14/22 1700  ceFEPIme (MAXIPIME) 2 g in sodium chloride 0.9 % 100 mL IVPB        08/14/22 1641     08/13/22 2230  ciprofloxacin (CIPRO) tablet 500 mg  Status:  Discontinued        08/13/22 2209 08/14/22 1619          Assessment & Plan: Covid vaccination;   Principal Problem:   Subacute osteomyelitis of right foot (HCC) Active Problems:   AKI (acute kidney injury) (HCC)   Essential hypertension   Stage 3a chronic kidney disease (CKD) (HCC) - baseline SCr 1.6   Unspecified hearing loss, unspecified ear   Heart failure with reduced ejection fraction and diastolic dysfunction (HCC)   PAF (paroxysmal atrial fibrillation) (HCC)   Subacute osteomyelitis of right foot (HCC) - Wound cultures from Wound clinic grew pansensitive pseudomonas. Pt without leukocytosis. Wound clinic had ordered outpatient MRI right foot to better evaluate osteo of right forefoot and right heel. Will obtain MRI. Start po cipro. Pt with good distal pedal pulses. I think pt's biggest problem is that he lives alone. He cannot be NWB on his left foot as he only as a limited hours with a caretaker. He has poor social support structure. He will likely need several weeks of abx. Maybe  short term SNF vs ALF placement. Will need TOC consult to assess. -Wound care at [facility] wound care clinic.  States wound care physician told him to come to: Hospital for admission instead of [facility]. -1/23 discussed case with Dr. Lajoyce Corners vascular surgery, he recommendedI reviewed his MRI scan which does show osteomyelitis of the first metatarsal head right foot. With his current social situation recommend oral antibiotics and I can follow-up in the office.  -1/23 discussed case with Dr. Louann Sjogren podiatry and she will see patient either this evening or first thing in the a.m. will await recommendations -1/23 Wound Culture right foot -1/23 discussed case with pharmacy clinic will call him: Will change patient to cefepime per pharmacy - 1/23 spoke with nursing staff concerning dressing changes, continue per WOC, except add and surgery gel and Telfon pad on heel ulcer prior to wrapping the foot.  Heel ulcer bleeds extensively when undressed.     Acute on CKD stage IIIa (Baseline Scr 1.6).  -1/23 hold fluids for now appears to have worsened kidney function. - 1/23 Avoid nephrotoxic agents. -1/23 avoid hypotension -1/23 Albumin 25 g x 1   Chronic systolic and diastolic CHF (HCC) -05/2022 LVEF 40-45% by echo  -Cardiologist in [place] with [facility]. -Strict in and out - Daily weight - 1/23 decrease Toprol 12.5 mg daily (home dose 25 mg)  Essential hypertension -1/23 slightly hypotensive -1/23 see CHF   PAF (paroxysmal atrial fibrillation) (HCC) -Currently NSR - Not on Coumadin secondary to fall risk   Unspecified hearing loss, unspecified ear -Chronic, stable per neighbors present at bedside   Hypokalemia - Potassium goal> 4 - 1/23 K-Dur 40 mill equivalent x 2 dose   Pressure injury RIGHT heel unstageable Pressure Injury 08/14/22 Heel Right Unstageable - Full thickness tissue loss in which the base of the injury is covered by slough (yellow, tan, gray, green or brown) and/or eschar (tan, brown or black) in the wound bed. (Active)  08/14/22 0020  Location: Heel  Location Orientation: Right  Staging: Unstageable - Full thickness tissue loss in which the base of the injury is covered by slough (yellow, tan, gray, green or brown) and/or eschar (tan, brown or black) in the wound bed.  Wound Description (Comments):   Present on Admission: Yes  Dressing Type None 08/14/22 0020     Pressure Injury 08/14/22 Other (Comment) Right -First metatarsal (Active)  08/14/22 0020  Location: Other (Comment) (Ball of foot)  Location Orientation: Right  Staging:   Wound Description (Comments): -First metatarsal  Present on Admission: Yes          Mobility Assessment (last 72 hours)     Mobility Assessment     Row Name 08/14/22 0100 08/14/22 0020         Does patient have an order for bedrest or is patient medically unstable No - Continue assessment No - Continue assessment      What is the highest level of mobility  based on the progressive mobility assessment? Level 5 (Walks with assist in room/hall) - Balance while stepping forward/back and can walk in room with assist - Complete Level 5 (Walks with assist in room/hall) - Balance while stepping forward/back and can walk in room with assist - Complete                    Time: 50 minutes         Care during the described time interval was provided by me .  I have reviewed this patient's available data, including medical history, events of note, physical examination, and all test results as part of my evaluation.

## 2022-08-14 NOTE — Plan of Care (Signed)

## 2022-08-14 NOTE — Progress Notes (Signed)
PT Cancellation Note  Patient Details Name: Hector Vazquez MRN: [id number] DOB: 1930-06-21   Cancelled Treatment:    Reason Eval/Treat Not Completed: Patient not medically ready. Per chart review, MRI results "torn anterior talofibular ligament, calcaneofibular ligament, and deep deltoid ligament; septic arthritis of the first MTP joint with osteomyelitis of the medial sesamoid and early osteomyelitis of the periarticular first metatarsal head and proximal phalanx. Adjacent soft tissue cellulitis and plantar ulcer." Awaiting further evaluation and direction from wound care and ortho for PT evaluation.    Talbot Grumbling PT, DPT 08/14/22, 9:37 AM

## 2022-08-14 NOTE — Progress Notes (Signed)
Pharmacy Antibiotic Note  Hector Vazquez is a 89 y.o. male admitted on 08/13/2022 with osteomyelitis. Pharmacy has been consulted for Cefepime dosing.  Plan: Cefepime 2g IV q24h Monitor renal function, cultures, clinical course  Height: 6\' 3"  (190.5 cm) Weight: 81.9 kg (180 lb 8.9 oz) IBW/kg (Calculated) : 84.5  Temp (24hrs), Avg:97.5 F (36.4 C), Min:97.4 F (36.3 C), Max:97.8 F (36.6 C)  Recent Labs  Lab 08/13/22 2008 08/14/22 0417  WBC 9.3 7.7  CREATININE 2.56* 2.13*    Estimated Creatinine Clearance: 26.7 mL/min (A) (by C-G formula based on SCr of 2.13 mg/dL (H)).    No Known Allergies  Antimicrobials this admission: PTA Cipro >> 1/23 1/23 Cefepime >>  Dose adjustments this admission: --  Microbiology results: 1/23 Right foot wound cx:   Thank you for allowing pharmacy to be a part of this patient's care.   Lindell Spar, PharmD, BCPS Clinical Pharmacist 08/14/2022 4:41 PM

## 2022-08-14 NOTE — Progress Notes (Signed)
ABI has been completed.   Preliminary results in CV Proc.   Hector Vazquez 08/14/2022 9:06 AM

## 2022-08-14 NOTE — Consult Note (Addendum)
[facility] Nurse Consult Note: Reason for Consult: Consult requested for right foot wounds.  Pt is followed as an outpatient by [facility] for chronic wounds which have been present 5 years, according to the patient.  They have ordered Venetian violet twice a day, informed patient we do not have this product in the [place] and we will substitute a similar product.  Wound type: Right anterior plantar foot with chronic full thickness wound, .8X.8X.8cm, bone palpable when probed with a swab.  Dark red-brown wound bed, no odor, drainage, or fluctuance.  Dry raised callous edges surrounding wound to 3 cm.  Right plantar heel with chronic full thickness wound, .5X.5X.2cm, Dark brown wound bed, no odor, drainage, or fluctuance.  Dry raised callous edges  Dressing procedure/placement/frequency: Topical treatment orders provided for bedside  nurses to perform as follows: Apply Aquacel to right plantar wounds in 2 locations Q day, then cover with ABD pad and kerlex.  Moisten with NS each time to assist with dressing removal and to cleanse wound.  According to MRI results: Septic arthritis of the first MTP joint with osteomyelitis of the medial sesamoid and early osteomyelitis of the periarticular first metatarsal head and proximal phalanx. Adjacent soft tissue cellulitis and plantar ulcer. Torn anterior talofibular ligament, calcaneofibular ligament, and deep deltoid ligament. These complex medical conditions are beyond the scope of practice for [facility] nurses.  Please consult ID and ortho teams for further plan of care.  Please re-consult if further assistance is needed.  Thank-you,  Julien Girt MSN, [place], [place], [place], [place]

## 2022-08-15 DIAGNOSIS — Z79899 Other long term (current) drug therapy: Secondary | ICD-10-CM | POA: Diagnosis not present

## 2022-08-15 DIAGNOSIS — I110 Hypertensive heart disease with heart failure: Secondary | ICD-10-CM | POA: Diagnosis not present

## 2022-08-15 DIAGNOSIS — I5040 Unspecified combined systolic (congestive) and diastolic (congestive) heart failure: Secondary | ICD-10-CM | POA: Diagnosis not present

## 2022-08-15 DIAGNOSIS — I5023 Acute on chronic systolic (congestive) heart failure: Secondary | ICD-10-CM | POA: Diagnosis not present

## 2022-08-15 DIAGNOSIS — M86171 Other acute osteomyelitis, right ankle and foot: Secondary | ICD-10-CM | POA: Diagnosis not present

## 2022-08-15 DIAGNOSIS — E876 Hypokalemia: Secondary | ICD-10-CM | POA: Diagnosis present

## 2022-08-15 DIAGNOSIS — N1831 Chronic kidney disease, stage 3a: Secondary | ICD-10-CM | POA: Diagnosis present

## 2022-08-15 DIAGNOSIS — M19071 Primary osteoarthritis, right ankle and foot: Secondary | ICD-10-CM | POA: Diagnosis present

## 2022-08-15 DIAGNOSIS — G629 Polyneuropathy, unspecified: Secondary | ICD-10-CM | POA: Diagnosis present

## 2022-08-15 DIAGNOSIS — I130 Hypertensive heart and chronic kidney disease with heart failure and stage 1 through stage 4 chronic kidney disease, or unspecified chronic kidney disease: Secondary | ICD-10-CM | POA: Diagnosis present

## 2022-08-15 DIAGNOSIS — I5042 Chronic combined systolic (congestive) and diastolic (congestive) heart failure: Secondary | ICD-10-CM | POA: Diagnosis present

## 2022-08-15 DIAGNOSIS — E860 Dehydration: Secondary | ICD-10-CM | POA: Diagnosis present

## 2022-08-15 DIAGNOSIS — M86271 Subacute osteomyelitis, right ankle and foot: Secondary | ICD-10-CM | POA: Diagnosis present

## 2022-08-15 DIAGNOSIS — H9190 Unspecified hearing loss, unspecified ear: Secondary | ICD-10-CM | POA: Diagnosis present

## 2022-08-15 DIAGNOSIS — M86671 Other chronic osteomyelitis, right ankle and foot: Secondary | ICD-10-CM | POA: Diagnosis present

## 2022-08-15 DIAGNOSIS — K5900 Constipation, unspecified: Secondary | ICD-10-CM | POA: Diagnosis not present

## 2022-08-15 DIAGNOSIS — Z602 Problems related to living alone: Secondary | ICD-10-CM | POA: Diagnosis present

## 2022-08-15 DIAGNOSIS — N179 Acute kidney failure, unspecified: Secondary | ICD-10-CM | POA: Diagnosis present

## 2022-08-15 DIAGNOSIS — I429 Cardiomyopathy, unspecified: Secondary | ICD-10-CM | POA: Diagnosis present

## 2022-08-15 DIAGNOSIS — I4821 Permanent atrial fibrillation: Secondary | ICD-10-CM | POA: Diagnosis present

## 2022-08-15 DIAGNOSIS — B965 Pseudomonas (aeruginosa) (mallei) (pseudomallei) as the cause of diseases classified elsewhere: Secondary | ICD-10-CM | POA: Diagnosis present

## 2022-08-15 DIAGNOSIS — L97412 Non-pressure chronic ulcer of right heel and midfoot with fat layer exposed: Secondary | ICD-10-CM | POA: Diagnosis present

## 2022-08-15 DIAGNOSIS — M869 Osteomyelitis, unspecified: Secondary | ICD-10-CM | POA: Diagnosis not present

## 2022-08-15 DIAGNOSIS — I10 Essential (primary) hypertension: Secondary | ICD-10-CM | POA: Diagnosis not present

## 2022-08-15 DIAGNOSIS — I4891 Unspecified atrial fibrillation: Secondary | ICD-10-CM | POA: Diagnosis not present

## 2022-08-15 DIAGNOSIS — L89610 Pressure ulcer of right heel, unstageable: Secondary | ICD-10-CM | POA: Diagnosis present

## 2022-08-15 DIAGNOSIS — I2510 Atherosclerotic heart disease of native coronary artery without angina pectoris: Secondary | ICD-10-CM | POA: Diagnosis present

## 2022-08-15 DIAGNOSIS — L03115 Cellulitis of right lower limb: Secondary | ICD-10-CM | POA: Diagnosis present

## 2022-08-15 DIAGNOSIS — Z01810 Encounter for preprocedural cardiovascular examination: Secondary | ICD-10-CM | POA: Diagnosis not present

## 2022-08-15 DIAGNOSIS — M009 Pyogenic arthritis, unspecified: Secondary | ICD-10-CM | POA: Diagnosis present

## 2022-08-15 DIAGNOSIS — I96 Gangrene, not elsewhere classified: Secondary | ICD-10-CM | POA: Diagnosis present

## 2022-08-15 DIAGNOSIS — L089 Local infection of the skin and subcutaneous tissue, unspecified: Secondary | ICD-10-CM | POA: Diagnosis not present

## 2022-08-15 NOTE — Plan of Care (Signed)

## 2022-08-15 NOTE — Progress Notes (Signed)
[facility]                                                                              Hector Vazquez, is a 89 y.o. male, DOB - [birth year], ZOX:[id number] Admit date - 08/13/2022    Outpatient Primary MD for the patient is [name], MD  LOS - 0  days  Chief Complaint  Patient presents with   Osteomyelitis        Brief summary   Patient is a 89 year old male with paroxysmal Afib not on anticoagulation due to fall risk, HTN, CKD stage IIIa, chronic osteomyelitis of the right calcaneus and recent osteomyelitis of first MTP head.  Patient has been followed at wound care clinic for some time and has been changing his dressings twice a day on his feet, lives at home by himself with no significant social support system at home. His last office visit wound care clinic [facility]) was on 1/18.  During that visit, his previous wound cultures grew Pseudomonas aeruginosa, sensitive to Cipro.  Xrays showed osteomyelitis of the calcaneus.  He was placed empirically on Doxy the week before.  Wound care MD documented that the patient has exposed bone of the right first metatarsal head, recommended MRI and nonweightbearing on the right leg.  Assessment & Plan    Principal Problem:   Subacute osteomyelitis of right foot (HCC)/septic arthritis -Wound cultures from the wound clinic grew pansensitive Pseudomonas and had recommended MRI of the right foot -MRI showed septic arthritis of the first MTP joint with osteomyelitis of the medial sesamoid and early osteomyelitis of the periarticular first metatarsal head and proximal phalanx.  Adjacent soft tissue cellulitis and plantar ulcer. 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) -2D echo 05/2022 had shown EF of 40 to 45%, global hypokinesis, indeterminate diastolic parameters -Currently euvolemic, cardiology has been consulted for operative clearance     PAF (paroxysmal atrial fibrillation) (HCC) -Heart rate controlled, continue beta-blocker -Not on anticoagulation due to risk of falls  Hypokalemia -Replaced  Pressure injury documentation -Right heel unstageable, POA   Code Status: Full CODE STATUS DVT Prophylaxis:  heparin injection 5,000 Units Start: 08/14/22 0600 SCDs Start: 08/14/22 0027   Level of Care: Level of care: Med-Surg Family Communication: Updated patient Disposition Plan:      Remains inpatient appropriate:   Procedures:  MRI right ankle/foot  Consultants:   ID Podiatry   Antimicrobials:   Anti-infectives (From admission, onward)    Start     Dose/Rate Route Frequency Ordered Stop   08/14/22 1700  ceFEPIme (MAXIPIME) 2 g in sodium chloride 0.9 % 100 mL IVPB  Status:  Discontinued        2 g 200 mL/hr over 30 Minutes Intravenous Every 24 hours 08/14/22 1641 08/15/22 1147   08/13/22 2230  ciprofloxacin (CIPRO) tablet 500 mg  Status:  Discontinued        500 mg Oral Daily 08/13/22 2209 08/14/22 1619          Medications  aspirin EC  81 mg Oral Daily   heparin  5,000 Units Subcutaneous Q8H   metoprolol succinate  12.5 mg Oral Daily      Subjective:   Hector Vazquez was seen and examined today.   Right ankle pain, chronic, no other acute complaints.  No fevers or chills, nausea vomiting, chest pain or shortness of breath.    Objective:   Vitals:   08/14/22 1930 08/15/22 0258 08/15/22 0535 08/15/22 1221  BP: (!) 90/54   113/61 98/61  Pulse: 95  84 69  Resp: 20   16  Temp: (!) 97.4 F (36.3 C)  98.2 F (36.8 C) (!) 97.5 F (36.4 C)  TempSrc: Oral  Oral Oral  SpO2: 95%  99% 98%  Weight:  82.8 kg    Height:        Intake/Output Summary (Last 24 hours) at 08/15/2022 1531 Last data filed at 08/15/2022 0534 Gross per 24 hour  Intake 220 ml  Output 600 ml  Net -380 ml     Wt Readings from Last 3 Encounters:  08/15/22 82.8 kg  07/01/22 81.4 kg  12/19/21 97 kg     Exam General: Alert and oriented x 3, NAD Cardiovascular: S1 S2 auscultated,  RRR Respiratory: Clear to auscultation bilaterally, no wheezing Gastrointestinal: Soft, nontender, nondistended, + bowel sounds Ext: no pedal edema bilaterally Neuro: no new deficits Skin: Dressing intact right foot Psych: Normal affect and demeanor, alert and oriented x3     Data Reviewed:  I have personally reviewed following labs    CBC Lab Results  Component Value Date   WBC 7.7 08/14/2022   RBC 3.40 (L) 08/14/2022   HGB 11.5 (L) 08/14/2022   HCT 34.8 (L) 08/14/2022   MCV 102.4 (H) 08/14/2022   MCH 33.8 08/14/2022   PLT 218 08/14/2022   MCHC 33.0 08/14/2022   RDW 14.4 08/14/2022   LYMPHSABS 1.7 08/14/2022   MONOABS 0.9 08/14/2022   EOSABS 0.1 08/14/2022   BASOSABS 0.0 08/14/2022     Last metabolic panel Lab Results  Component Value Date   NA 140 08/14/2022   K 3.1 (L) 08/14/2022   CL 103 08/14/2022   CO2 26 08/14/2022   BUN 51 (H) 08/14/2022   CREATININE 2.13 (H) 08/14/2022   GLUCOSE 93 08/14/2022   GFRNONAA 29 (L) 08/14/2022   CALCIUM 8.0 (L) 08/14/2022   PHOS 2.9 08/14/2022   PROT 5.7 (L) 08/14/2022   ALBUMIN 2.8 (L) 08/14/2022   BILITOT 0.6 08/14/2022   ALKPHOS 29 (L) 08/14/2022   AST 20 08/14/2022   ALT 15 08/14/2022   ANIONGAP 11 08/14/2022    CBG (last 3)  No results for input(s): "GLUCAP" in the last 72 hours.    Coagulation Profile: Recent Labs  Lab 08/13/22 2008  INR 1.2     Radiology Studies: I  have personally reviewed the imaging studies  VAS [place] ABI WITH/WO TBI  Result Date: 08/14/2022  LOWER EXTREMITY DOPPLER STUDY Patient Name:  Hector Vazquez  Date of Exam:   08/14/2022 Medical Rec #: [id number]    Accession #:    [id number] Date of Birth:  09-17-1929     Patient Gender: M Patient Age:   70 years Exam Location[facility]  [facility] Procedure:      VAS [place] ABI WITH/WO TBI Referring Phys: ERIC CHEN --------------------------------------------------------------------------------  Indications: Ulceration. High Risk Factors: Coronary artery disease.  Comparison Study: no prior Performing Technologist: [place] Ponder RVS  Examination Guidelines: A complete evaluation includes at minimum, Doppler waveform signals and systolic blood pressure reading at the level of bilateral brachial, anterior tibial, and posterior tibial arteries, when vessel segments are accessible. Bilateral testing is considered an integral part of a complete examination. Photoelectric Plethysmograph (PPG) waveforms and toe systolic pressure readings are included as required and additional duplex testing as needed. Limited examinations for reoccurring indications may be performed as noted.  ABI Findings: +---------+------------------+-----+---------+--------+ Right    Rt Pressure (mmHg)IndexWaveform Comment  +---------+------------------+-----+---------+--------+ Brachial 100                    triphasic         +---------+------------------+-----+---------+--------+ PTA      128               1.25 triphasic         +---------+------------------+-----+---------+--------+ DP       102               1.00 triphasic         +---------+------------------+-----+---------+--------+ Great Toe86                0.84 Normal            +---------+------------------+-----+---------+--------+ +---------+------------------+-----+---------+-------+ Left     Lt Pressure (mmHg)IndexWaveform Comment  +---------+------------------+-----+---------+-------+ Brachial 102                    triphasic        +---------+------------------+-----+---------+-------+ PTA      128               1.25 triphasic        +---------+------------------+-----+---------+-------+ DP       126               1.24 triphasic        +---------+------------------+-----+---------+-------+ Great Toe97                0.95 Normal           +---------+------------------+-----+---------+-------+ +-------+-----------+-----------+------------+------------+ ABI/TBIToday's ABIToday's TBIPrevious ABIPrevious TBI +-------+-----------+-----------+------------+------------+ Right  1.25       0.84                                +-------+-----------+-----------+------------+------------+ Left   1.25       0.95                                +-------+-----------+-----------+------------+------------+  Summary: Right: Resting right ankle-brachial index is within normal range. The right toe-brachial index is normal. Left: Resting left ankle-brachial index is within normal range. The left toe-brachial index is normal. *See table(s) above for measurements and observations.  Electronically signed by Waverly Ferrari MD on 08/14/2022 at 4:18:46 PM.    Final    MR FOOT RIGHT W WO CONTRAST  Result Date: 08/14/2022 CLINICAL DATA:  Osteomyelitis, foot right heel and right forefoot osteomyelitis EXAM: MRI OF THE RIGHT FOREFOOT WITHOUT AND WITH CONTRAST; MRI OF THE RIGHT ANKLE WITHOUT AND WITH CONTRAST TECHNIQUE: Multiplanar, multisequence MR imaging  of the right foot and ankle was performed before and after the administration of intravenous contrast. CONTRAST:  1mL GADAVIST GADOBUTROL 1 MMOL/ML IV SOLN COMPARISON:  Foot radiograph 08/13/2022. FINDINGS: MRI foot Bones/Joint/Cartilage There is marrow edema and confluent low T1 signal within the great toe medial sesamoid (axial T1 image 25). There is also marrow edema  within the first metatarsal head and plantar aspect of the base of the proximal phalanx. There is associated marrow enhancement. There is a first MTP joint effusion with lobular heterogeneous fluid and thick synovial enhancement. No other significant marrow signal alteration in the forefoot. Ligaments Intact Lisfranc ligament.  Probable great toe plantar plate tear. Muscles and Tendons Diffuse muscle atrophy consistent with chronic denervation. Severe tendinosis of the flexor hallucis tendon. Soft tissues Diffuse soft tissue swelling of the foot. Medial forefoot soft tissue enhancement and swelling with plantar ulcer (axial postcontrast image 27). No evidence of soft tissue abscess. MRI ankle TENDONS Peroneal: There is a complete tear of the peroneal brevis tendon at the lateral malleolus, with distal stump at the level of the lateral calcaneus and the proximal stump above the malleolus, tendon gap measuring approximately 5.7 cm, likely chronic. The peroneal longus tendon is intact. Minimal reactive peroneal tenosynovitis. Posteromedial: Intact tibialis posterior, flexor hallucis longus and flexor digitorum longus tendons. Anterior: Intact tibialis anterior, extensor hallucis longus and extensor digitorum longus tendons. Achilles: There is insertional Achilles tendinosis. No significant retrocalcaneal bursitis. Plantar Fascia: There is thickening of the central band plantar fascia compatible with chronic/prior plantar fasciitis. LIGAMENTS Lateral: Torn anterior talofibular ligament and calcaneofibular ligament. Posterior talofibular ligament intact. Anterior and posterior tibiofibular ligaments intact. Medial: Torn deep deltoid ligament.  Spring ligament intact. CARTILAGE Ankle Joint: Small tibiotalar joint effusion. Tibiotalar osteophyte formation with subchondral marrow edema and cystic change and high-grade cartilage loss with bony remodeling. Subtalar Joints/Sinus Tarsi: Mild-to-moderate posterior middle  subtalar osteoarthritis. Loss of fat signal in the sinus tarsi. Moderate joint effusion. Bones: Hindfoot valgus with extra-articular subcortical marrow edema and cystic change in the lateral hindfoot with sub fibular soft tissue edema. No convincing marrow signal abnormality suggesting osteomyelitis. Plantar calcaneal spur. Soft Tissue: Extensive soft tissue swelling of the ankle and foot. Soft tissue enhancement in the plantar hindfoot with potential tiny sinus tract noted (coronal postcontrast images 20 2-23, within the subcutaneous tissues extending in close proximity to the central band plantar fascia. There is no evidence of a well-defined/drainable soft tissue abscess in the hindfoot. Diffuse muscle atrophy consistent with chronic denervation. 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 Adjacent soft tissue cellulitis and plantar ulcer. Hindfoot valgus with findings of lateral hindfoot impingement. No convincing evidence of osteomyelitis in the hindfoot. Plantar heel soft tissue cellulitis with possible tiny sinus tract extending in close proximity to the central band plantar fascia. No evidence of a well-defined, drainable soft tissue abscess in the hindfoot. 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 Mild-to-moderate posterior middle subtalar osteoarthritis. Small tibiotalar and moderate posterior subtalar effusions. Torn anterior talofibular ligament, calcaneofibular ligament, and deep deltoid ligament. Electronically Signed   By: Maurine Simmering M.D.   On: 08/14/2022 08:32   MR ANKLE RIGHT W WO CONTRAST  Result Date: 08/14/2022 CLINICAL DATA:  Osteomyelitis, foot right heel and right forefoot osteomyelitis EXAM: MRI OF THE RIGHT FOREFOOT WITHOUT AND WITH CONTRAST; MRI OF THE RIGHT ANKLE WITHOUT AND WITH  CONTRAST  TECHNIQUE: Multiplanar, multisequence MR imaging of the right foot and ankle was performed before and after the administration of intravenous contrast. CONTRAST:  51mL GADAVIST GADOBUTROL 1 MMOL/ML IV SOLN COMPARISON:  Foot radiograph 08/13/2022. FINDINGS: MRI foot Bones/Joint/Cartilage There is marrow edema and confluent low T1 signal within the great toe medial sesamoid (axial T1 image 25). There is also marrow edema within the first metatarsal head and plantar aspect of the base of the proximal phalanx. There is associated marrow enhancement. There is a first MTP joint effusion with lobular heterogeneous fluid and thick synovial enhancement. No other significant marrow signal alteration in the forefoot. Ligaments Intact Lisfranc ligament.  Probable great toe plantar plate tear. Muscles and Tendons Diffuse muscle atrophy consistent with chronic denervation. Severe tendinosis of the flexor hallucis tendon. Soft tissues Diffuse soft tissue swelling of the foot. Medial forefoot soft tissue enhancement and swelling with plantar ulcer (axial postcontrast image 27). No evidence of soft tissue abscess. MRI ankle TENDONS Peroneal: There is a complete tear of the peroneal brevis tendon at the lateral malleolus, with distal stump at the level of the lateral calcaneus and the proximal stump above the malleolus, tendon gap measuring approximately 5.7 cm, likely chronic. The peroneal longus tendon is intact. Minimal reactive peroneal tenosynovitis. Posteromedial: Intact tibialis posterior, flexor hallucis longus and flexor digitorum longus tendons. Anterior: Intact tibialis anterior, extensor hallucis longus and extensor digitorum longus tendons. Achilles: There is insertional Achilles tendinosis. No significant retrocalcaneal bursitis. Plantar Fascia: There is thickening of the central band plantar fascia compatible with chronic/prior plantar fasciitis. LIGAMENTS Lateral: Torn anterior talofibular ligament and calcaneofibular  ligament. Posterior talofibular ligament intact. Anterior and posterior tibiofibular ligaments intact. Medial: Torn deep deltoid ligament.  Spring ligament intact. CARTILAGE Ankle Joint: Small tibiotalar joint effusion. Tibiotalar osteophyte formation with subchondral marrow edema and cystic change and high-grade cartilage loss with bony remodeling. Subtalar Joints/Sinus Tarsi: Mild-to-moderate posterior middle subtalar osteoarthritis. Loss of fat signal in the sinus tarsi. Moderate joint effusion. Bones: Hindfoot valgus with extra-articular subcortical marrow edema and cystic change in the lateral hindfoot with sub fibular soft tissue edema. No convincing marrow signal abnormality suggesting osteomyelitis. Plantar calcaneal spur. Soft Tissue: Extensive soft tissue swelling of the ankle and foot. Soft tissue enhancement in the plantar hindfoot with potential tiny sinus tract noted (coronal postcontrast images 20 2-23, within the subcutaneous tissues extending in close proximity to the central band plantar fascia. There is no evidence of a well-defined/drainable soft tissue abscess in the hindfoot. Diffuse muscle atrophy consistent with chronic denervation. 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 Adjacent soft tissue cellulitis and plantar ulcer. Hindfoot valgus with findings of lateral hindfoot impingement. No convincing evidence of osteomyelitis in the hindfoot. Plantar heel soft tissue cellulitis with possible tiny sinus tract extending in close proximity to the central band plantar fascia. No evidence of a well-defined, drainable soft tissue abscess in the hindfoot. 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 Mild-to-moderate posterior middle subtalar osteoarthritis. Small tibiotalar and moderate posterior subtalar  effusions. Torn anterior talofibular ligament, calcaneofibular ligament, and deep deltoid ligament. Electronically Signed   By: Maurine Simmering M.D.   On: 08/14/2022 08:32   DG Skull 1-3 Views  Result Date: 08/13/2022 CLINICAL DATA:  Osteomyelitis.  Metal foreign body screen. EXAM: SKULL - 1-3 VIEW COMPARISON:  None Available. FINDINGS: There is no evidence of  skull fracture or other focal bone lesions. Dental hardware noted. No unexpected intra orbital metallic foreign body IMPRESSION: 1. No unexpected intra orbital metallic foreign body. . Electronically Signed   By: Helyn Numbers M.D.   On: 08/13/2022 23:28   DG Abd Portable 1V  Result Date: 08/13/2022 CLINICAL DATA:  Osteomyelitis.  Metal screening for MRI. EXAM: PORTABLE ABDOMEN - 1 VIEW COMPARISON:  None Available. FINDINGS: Normal abdominal gas pattern. No gross free intraperitoneal gas. Surgical clips and surgical skin staples are noted within the lower quadrant the abdomen bilaterally. Right total hip arthroplasty has been performed. No acute bone abnormality. IMPRESSION: 1. Normal abdominal gas pattern. No gross free intraperitoneal gas. Surgical clips and skin staples possibly relate to bilateral inguinal hernia repair. No unexpected metallic foreign body within the abdomen. Electronically Signed   By: Helyn Numbers M.D.   On: 08/13/2022 23:27   DG CHEST PORT 1 VIEW  Result Date: 08/13/2022 CLINICAL DATA:  Metal screening, osteomyelitis EXAM: PORTABLE CHEST 1 VIEW COMPARISON:  None Available. FINDINGS: Lungs are clear. No pneumothorax or pleural effusion. Cardiac size within normal limits. Pulmonary vascularity is normal. No unexpected metallic foreign body noted within the thorax. IMPRESSION: 1. No active disease. No unexpected metallic foreign body. Electronically Signed   By: Helyn Numbers M.D.   On: 08/13/2022 23:26   DG Foot Complete Right  Result Date: 08/13/2022 CLINICAL DATA:  Plantar ulcers at heel and first metatarsal EXAM: RIGHT  FOOT COMPLETE - 3+ VIEW COMPARISON:  1124 FINDINGS: Osseous demineralization. Advanced degenerative changes of the ankle joint. Remaining joint spaces preserved. Questionable loss of cortical margination at the plantar aspect of the posterior calcaneus, concerning for subtle osteomyelitis. No acute fracture, dislocation, or additional bone destruction. Specifically, no definite bone destruction is seen at the plantar aspect of the first MTP joint at site of ulcer. IMPRESSION: Question subtle osteomyelitis of the plantar surface of the posterior calcaneus, similar to prior exam; this could be better assessed by MR. Advanced degenerative changes at ankle. Electronically Signed   By: Ulyses Southward M.D.   On: 08/13/2022 20:43       Britteney Ayotte M.D. [facility] 08/15/2022, 3:31 PM  Available via Epic secure chat 7am-7pm After 7 pm, please refer to night coverage provider listed on amion.

## 2022-08-15 NOTE — TOC Progression Note (Signed)
Transition of Care [facility]) - Progression Note    Patient Details  Name: Hector Vazquez MRN: [id number] Date of Birth: [birth year]  Transition of Care [facility]) CM/SW Contact  Henrietta Dine, RN Phone Number: 08/15/2022, 6:23 PM  Clinical Narrative:    Attempted to complete TOC assessment; pt working will PT; will attempt to complete later.       Expected Discharge Plan and Services                                               Social Determinants of Health (SDOH) Interventions SDOH Screenings   Food Insecurity: No Food Insecurity (08/14/2022)  Housing: Low Risk  (08/14/2022)  Transportation Needs: No Transportation Needs (08/14/2022)  Utilities: Not At Risk (08/14/2022)  Tobacco Use: Low Risk  (08/14/2022)    Readmission Risk Interventions     No data to display

## 2022-08-15 NOTE — Consult Note (Signed)
[facility]ious Disease    Date of Admission:  08/13/2022   Total days of inpatient antibiotics 2        Reason for Consult: Osteomyelitis    Principal Problem:   Subacute osteomyelitis of right foot ([place]) Active Problems:   Essential hypertension   Stage 3a chronic kidney disease (CKD) (HCC) - baseline SCr 1.6   Unspecified hearing loss, unspecified ear   Heart failure with reduced ejection fraction and diastolic dysfunction (HCC)   AKI (acute kidney injury) (HCC)   PAF (paroxysmal atrial fibrillation) (HCC)   Osteomyelitis (HCC)   Assessment: 89 year old male with PAF not on AC due to fall risk, CKD stage III admitted for right foot wound involving the first metatarsal head, calcaneus.  Patient is on cefepime. #Osteomyelitis of right foot involving 1st MTP and septic arthritis of 1st MTP #CKD -Vital stable on admission, no leukocytosis.  MRI of right foot showed septic arthritis of first MTP joint with osteomyelitis, adjacent soft tissue cellulitis plantar heel soft tissue cellulitis with possible tiny sinus tract distended close proximity of central band plantar fascia. -Patient is followed by [facility] wound care, Dr. Welton Flakes.  On 08/02/2022 noted that he was not wearing his postop shoe, wearing regular shoe.  Noted that first [place] wound was blistered.  Patient underwent debridement and cultures taken of tendon which grew Pseudomonas.  No visible bone.  But concern for osteomyelitis Recommendations:  -D/C cefepime, no concern at this point for surrounding skin soft tissue infection.  Bone is exposed. - Plan on OR for debridement tomorrow, please obtain cultures. Start cipro following OR. - As no bone was biopsied by wound care, will await bone biopsy prior to committing to a long course of fluoroquinolone versus antibiotics in a 89 year old male with multiple comorbidities.   I have personally spent 95 minutes involved in face-to-face and  non-face-to-face activities for this patient on the day of the visit. Professional time spent includes the following activities: Preparing to see the patient (review of tests), Obtaining and/or reviewing separately obtained history (admission/discharge record), Performing a medically appropriate examination and/or evaluation , Ordering medications/tests/procedures, referring and communicating with other health care professionals, Documenting clinical information in the EMR, Independently interpreting results (not separately reported), Communicating results to the patient/family/caregiver, Counseling and educating the patient/family/caregiver and Care coordination (not separately reported).   Microbiology:   Antibiotics: Ciprofloxacin 1/22-23 Cefepime 1/22-  Cultures:  Other 1/23 wound swab NG  HPI: Hector Vazquez is a 89 y.o. male with past medical history of PAF not on AC due to fall risk, hypertension CKD stage III, chronic osteomyelitis of the right calcaneus and recent osteomyelitis of the first metatarsal head admitted for subacute osteomyelitis of right foot.  He is followed by wound care at [facility], placed on ciprofloxacin based on wound cultures on 1/1 growing Pseudomonas.  He was on doxycycline week prior to that.  On arrival vitals stable, no leukocytosis.  Podiatry and infectious disease engaged for further management.   Review of Systems: Review of Systems  All other systems reviewed and are negative.   Past Medical History:  Diagnosis Date   Coronary artery disease     Social History   Tobacco Use   Smoking status: Never   Smokeless tobacco: Never  Vaping Use   Vaping Use: Never used  Substance Use Topics   Alcohol use: Never   Drug use: Never    History reviewed. No pertinent family history.  Scheduled Meds:  aspirin EC  81 mg Oral Daily   heparin  5,000 Units Subcutaneous Q8H   metoprolol succinate  12.5 mg Oral Daily   Continuous Infusions: PRN  Meds:.acetaminophen **OR** acetaminophen, ondansetron **OR** ondansetron (ZOFRAN) IV No Known Allergies  OBJECTIVE: Blood pressure 103/72, pulse 97, temperature 97.6 F (36.4 C), temperature source Oral, resp. rate 16, height 6\' 3"  (1.905 m), weight 82.8 kg, SpO2 98 %.  Physical Exam Constitutional:      General: He is not in acute distress.    Appearance: He is normal weight. He is not toxic-appearing.  HENT:     Head: Normocephalic and atraumatic.     Right Ear: External ear normal.     Left Ear: External ear normal.     Nose: No congestion or rhinorrhea.     Mouth/Throat:     Mouth: Mucous membranes are moist.     Pharynx: Oropharynx is clear.  Eyes:     Extraocular Movements: Extraocular movements intact.     Conjunctiva/sclera: Conjunctivae normal.     Pupils: Pupils are equal, round, and reactive to light.  Cardiovascular:     Rate and Rhythm: Normal rate and regular rhythm.     Heart sounds: No murmur heard.    No friction rub. No gallop.  Pulmonary:     Effort: Pulmonary effort is normal.     Breath sounds: Normal breath sounds.  Abdominal:     General: Abdomen is flat. Bowel sounds are normal.     Palpations: Abdomen is soft.  Musculoskeletal:        General: No swelling. Normal range of motion.     Cervical back: Normal range of motion and neck supple.  Skin:    General: Skin is warm and dry.  Neurological:     General: No focal deficit present.     Mental Status: He is oriented to person, place, and time.  Psychiatric:        Mood and Affect: Mood normal.     Lab Results Lab Results  Component Value Date   WBC 7.7 08/14/2022   HGB 11.5 (L) 08/14/2022   HCT 34.8 (L) 08/14/2022   MCV 102.4 (H) 08/14/2022   PLT 218 08/14/2022    Lab Results  Component Value Date   CREATININE 2.13 (H) 08/14/2022   BUN 51 (H) 08/14/2022   NA 140 08/14/2022   K 3.1 (L) 08/14/2022   CL 103 08/14/2022   CO2 26 08/14/2022    Lab Results  Component Value Date   ALT  15 08/14/2022   AST 20 08/14/2022   ALKPHOS 29 (L) 08/14/2022   BILITOT 0.6 08/14/2022       Laurice Record, [facility] 08/15/2022, 9:58 PM

## 2022-08-15 NOTE — Evaluation (Addendum)
Physical Therapy Evaluation Patient Details Name: Hector Vazquez MRN: [id number] DOB: 1930/02/09 Today's Date: 08/15/2022  History of Present Illness  Hector Vazquez is a 89 y.o. male admitted with subacute osteomyelitis of R foot. PMH: CHF, CKD stage 3, HTN, afib  Clinical Impression  Pt admitted with above diagnosis. Pt from home alone, 1 step to enter the home, using RW around the home, has housekeeper who also cooks meals sometimes. Pt needing mod A to power up from elevated bed, assist to come forward. Pt completes stand pivot to recliner with RW, min A, questionable NWB RLE status with post-op shoe on. Podiatry note reviewed, WB status WBAT with post-op shoe; followed NWB orders during eval. Pt able to perform seated exercises without pain or difficulty. Pt remains up in chair, bed noted to be soiled in urine from mal urinal use; RN aware no chair alarm in chair. Plan for surgery tomorrow 08/16/22, plan to progress pt as able; pt will need clear WB status orders post-op. Recommend CIR prior to return home alone, pt previously ind with RW and would benefit from intense therapy prior to return home alone with housekeeper cleaning the home and occasional cooking. Pt currently with functional limitations due to the deficits listed below (see PT Problem List). Pt will benefit from skilled PT to increase their independence and safety with mobility to allow discharge to the venue listed below.           Recommendations for follow up therapy are one component of a multi-disciplinary discharge planning process, led by the attending physician.  Recommendations may be updated based on patient status, additional functional criteria and insurance authorization.  Follow Up Recommendations Acute inpatient rehab (3hours/day) Can patient physically be transported by private vehicle: Yes    Assistance Recommended at Discharge Frequent or constant Supervision/Assistance  Patient can return home with the following  A  little help with walking and/or transfers;A little help with bathing/dressing/bathroom;Assistance with cooking/housework;Assist for transportation;Help with stairs or ramp for entrance    Equipment Recommendations Other (comment) (TBD)  Recommendations for Other Services       Functional Status Assessment Patient has had a recent decline in their functional status and demonstrates the ability to make significant improvements in function in a reasonable and predictable amount of time.     Precautions / Restrictions Precautions Precautions: Fall Precaution Comments: R foot wounds Restrictions Weight Bearing Restrictions: Yes RLE Weight Bearing:  (NWB per orders; WBAT with post op shoe per podiatry note 08/15/22)      Mobility  Bed Mobility Overal bed mobility: Needs Assistance Bed Mobility: Supine to Sit  Supine to sit: Min assist  General bed mobility comments: pt able to bring BLE to EOB, pulls on therapist's hands to upright trunk into sitting    Transfers Overall transfer level: Needs assistance Equipment used: Rolling walker (2 wheels) Transfers: Sit to/from Stand, Bed to chair/wheelchair/BSC Sit to Stand: Mod assist, Min assist, From elevated surface  Step pivot transfers: Min assist  General transfer comment: mod A to stand from elevated bed with initial rise, cues for weight shifting anterior; min A to power to stand from recliner with armrests; min A to steady with step pivot to recliner from EOB    Ambulation/Gait  General Gait Details: pt able to clear both feet from floor with RW for 2-3 steps to bedside recliner, questionable NWB LLE  Stairs            Wheelchair Mobility    Modified Rankin (Stroke  Patients Only)       Balance Overall balance assessment: Needs assistance  Sitting balance-Leahy Scale: Good  Standing balance support: Reliant on assistive device for balance, During functional activity, Bilateral upper extremity supported Standing  balance-Leahy Scale: Poor       Pertinent Vitals/Pain Pain Assessment Pain Assessment: No/denies pain    Home Living Family/patient expects to be discharged to:: Private residence Living Arrangements: Alone Available Help at Discharge: Other (Comment) (private pay assist 7 days a week for 1-3hrs to assist with household chores) Type of Home: House Home Access: Stairs to enter   [employer] of Steps: 1   Home Layout: One level Home Equipment: Conservation officer, nature (2 wheels);Cane - single point      Prior Function Prior Level of Function : Independent/Modified Independent;Needs assist  Mobility Comments: pt reports using RW around the home and limited community distances, denies any falls in last 6 months (noted to have frequent falls per chart review from previous admission) ADLs Comments: pt reports ind wtih self care, has housekeeper who does "some" cooking     Hand Dominance   Dominant Hand: Right    Extremity/Trunk Assessment   Upper Extremity Assessment Upper Extremity Assessment: Defer to OT evaluation    Lower Extremity Assessment Lower Extremity Assessment: RLE deficits/detail;LLE deficits/detail RLE Deficits / Details: AROM WFL, knee extension 4-/5, hip abduction 4-/5, hip adduction 4-/5, ankle NT with dressing on RLE Coordination: WNL LLE Deficits / Details: AROM WFL, knee extension 4-/5, hip abduction 4-/5, hip adduction 4-/5, ankle DF/PF 4-/5 LLE Coordination: WNL    Cervical / Trunk Assessment Cervical / Trunk Assessment: Kyphotic  Communication   Communication: HOH  Cognition Arousal/Alertness: Awake/alert Behavior During Therapy: WFL for tasks assessed/performed Overall Cognitive Status: No family/caregiver present to determine baseline cognitive functioning  General Comments: pt pleasant, aware he is in the hospital, follows commands, very HOH, determined to go home and repeats "I want to go home" frequently during eval        General  Comments      Exercises General Exercises - Lower Extremity Long Arc Quad: Seated, AROM, Strengthening, Both, 10 reps Straight Leg Raises: Seated, AROM, Strengthening, Both, 10 reps Hip Flexion/Marching: Seated, AROM, Strengthening, Both, 10 reps   Assessment/Plan    PT Assessment Patient needs continued PT services  PT Problem List Decreased strength;Decreased activity tolerance;Decreased balance;Decreased mobility;Decreased knowledge of use of DME;Decreased safety awareness;Decreased knowledge of precautions       PT Treatment Interventions DME instruction;Gait training;Functional mobility training;Therapeutic activities;Therapeutic exercise;Balance training;Neuromuscular re-education;Patient/family education    PT Goals (Current goals can be found in the Care Plan section)  Acute Rehab PT Goals Patient Stated Goal: "I want to go home" PT Goal Formulation: With patient Time For Goal Achievement: 08/29/22 Potential to Achieve Goals: Good    Frequency Min 3X/week     Co-evaluation               AM-PAC PT "6 Clicks" Mobility  Outcome Measure Help needed turning from your back to your side while in a flat bed without using bedrails?: A Little Help needed moving from lying on your back to sitting on the side of a flat bed without using bedrails?: A Little Help needed moving to and from a bed to a chair (including a wheelchair)?: A Little Help needed standing up from a chair using your arms (e.g., wheelchair or bedside chair)?: A Little Help needed to walk in hospital room?: A Lot Help needed climbing 3-5 steps with  a railing? : Total 6 Click Score: 15    End of Session Equipment Utilized During Treatment: Gait belt Activity Tolerance: Patient tolerated treatment well;No increased pain Patient left: in chair;with call bell/phone within reach Nurse Communication: Mobility status;Other (comment) (therapist found chair alarm cord hookup, but no chair monitors on floor -RN  notified) PT Visit Diagnosis: Unsteadiness on feet (R26.81);Other abnormalities of gait and mobility (R26.89);Muscle weakness (generalized) (M62.81)    Time: 1950-9326 PT Time Calculation (min) (ACUTE ONLY): 37 min   Charges:   PT Evaluation $PT Eval Moderate Complexity: 1 Mod PT Treatments $Therapeutic Exercise: 8-22 mins         Tori Rody Keadle PT, DPT 08/15/22, 3:34 PM

## 2022-08-15 NOTE — Progress Notes (Signed)
Inpatient Rehab Admissions Coordinator:   Per PT recommendations pt was screened for CIR candidacy by Shann Medal, PT, DPT.  [employer] Medicare unlikely to approve CIR for pt diagnosis.  Would recommend TOC investigate other rehab venues per post-op therapy recommendations.   Shann Medal, PT, DPT Admissions Coordinator [phone] 08/15/22  4:37 PM

## 2022-08-15 NOTE — Consult Note (Signed)
PODIATRY CONSULTATION  NAME Hector Vazquez MRN [id number] DOB [birth year] DOA 08/13/2022   Reason for consult:  Chief Complaint  Patient presents with   Osteomyelitis     Attending/Consulting physician: Rai MD  History of present illness: 89 y.o. male with Afib, HTN, CKD stage 3a, with chronic osteomyelitis of the right calcaneus and recent osteomyelitis of the first metatarsal head, with worsening or right foot ulcerations.  Lives alone.   He states that his wound care doctor at [place] directed him to come to [facility] for evaluation due to concern for bone infection. Prior cultures grew pseudomonas aeruginosa. He states he has ankle pain for a long time. Denies any N/V/F/C. He thinks the wounds were getting better previously.   Past Medical History:  Diagnosis Date   Coronary artery disease        Latest Ref Rng & Units 08/14/2022    4:17 AM 08/13/2022    8:08 PM 06/25/2022    5:44 AM  CBC  WBC 4.0 - 10.5 K/uL 7.7  9.3  8.5   Hemoglobin 13.0 - 17.0 g/dL 11.5  12.4  12.5   Hematocrit 39.0 - 52.0 % 34.8  38.1  36.5   Platelets 150 - 400 K/uL 218  246  286        Latest Ref Rng & Units 08/14/2022    4:17 AM 08/13/2022    8:08 PM 07/01/2022    1:33 AM  BMP  Glucose 70 - 99 mg/dL 93  107  111   BUN 8 - 23 mg/dL 51  68  32   Creatinine 0.61 - 1.24 mg/dL 2.13  2.56  1.65   Sodium 135 - 145 mmol/L 140  137  137   Potassium 3.5 - 5.1 mmol/L 3.1  3.4  4.6   Chloride 98 - 111 mmol/L 103  100  106   CO2 22 - 32 mmol/L 26  22  22    Calcium 8.9 - 10.3 mg/dL 8.0  8.6  8.6       Physical Exam: Lower Extremity Exam Vasc: R - PT palpable, DP palpable. Cap refill < 3 sec to digits  L - PT palpable, DP palpable. Cap refill <3 sec to digits  Derm: R - Ulceration at the plantar aspect of the 1st met head right foot 1 cm diameter with necrotic and fibrotic tissue surroudning. Probes to bone. Serosanguinous drainage. Ulceration superficial to sub q tissue plantar heel,  gentian violet surrounding the wound. No erythema but some edema of the right foot.        L - Normal temp/texture/turgor with no open lesion or clinical signs of infection   MSK:  R - Pes planus deformity  L -  Pes planus deformity  Neuro: R - Gross sensation diminished to fofrefoot. Gross motor function intac. Pain in right ankle due to arthritis.   L - Gross sensation diminished to forefoot. Gross motor function intact      ASSESSMENT/PLAN OF CARE 89 y.o. male with PMHx significant for  Afib, HTN, CKD3, Chronic OM R foot with concern for worsening of right foot ucleration and underlying osteomyelitis of the medial sesamoid and 1st met head s/p long term wound care. Also with Right heel ulceration, questionable calcaneal osteomyelitis on MRI.   - Tentative plan for OR tmrw for Right foot wound debridement, I&D 1st MPJ, medial sesamoid excision, possible wound graft - Recommend cardiology consult for operative clearance pre op - Abx per ID, currently  plan to hold until after OR cultures. - Anticoagulation: hold pre op - Wound care: Dry dressing to right foot pre op - WB status: WBAT to RLE in post op shoe - Will continue to follow   Thank you for the consult.  Please contact me directly with any questions or concerns.           Everitt Amber, DPM [facility]    [address], [place] [address]                Office [phone]  Fax [phone]

## 2022-08-16 ENCOUNTER — Encounter (HOSPITAL_COMMUNITY): Payer: Self-pay | Admitting: Internal Medicine

## 2022-08-16 ENCOUNTER — Encounter (HOSPITAL_COMMUNITY)
Admission: EM | Disposition: A | Discharge status: Skilled Nursing Facility | Payer: Self-pay | Source: Home / Self Care | Attending: Internal Medicine

## 2022-08-16 ENCOUNTER — Other Ambulatory Visit: Payer: Self-pay

## 2022-08-16 ENCOUNTER — Inpatient Hospital Stay (HOSPITAL_COMMUNITY): Payer: Medicare HMO | Admitting: Anesthesiology

## 2022-08-16 DIAGNOSIS — M86271 Subacute osteomyelitis, right ankle and foot: Secondary | ICD-10-CM | POA: Diagnosis not present

## 2022-08-16 DIAGNOSIS — I110 Hypertensive heart disease with heart failure: Secondary | ICD-10-CM

## 2022-08-16 DIAGNOSIS — Z01810 Encounter for preprocedural cardiovascular examination: Secondary | ICD-10-CM | POA: Diagnosis not present

## 2022-08-16 DIAGNOSIS — I429 Cardiomyopathy, unspecified: Secondary | ICD-10-CM | POA: Diagnosis not present

## 2022-08-16 DIAGNOSIS — N179 Acute kidney failure, unspecified: Secondary | ICD-10-CM | POA: Diagnosis not present

## 2022-08-16 DIAGNOSIS — L089 Local infection of the skin and subcutaneous tissue, unspecified: Secondary | ICD-10-CM | POA: Diagnosis not present

## 2022-08-16 DIAGNOSIS — I4821 Permanent atrial fibrillation: Secondary | ICD-10-CM | POA: Diagnosis not present

## 2022-08-16 DIAGNOSIS — L97412 Non-pressure chronic ulcer of right heel and midfoot with fat layer exposed: Secondary | ICD-10-CM

## 2022-08-16 DIAGNOSIS — I4891 Unspecified atrial fibrillation: Secondary | ICD-10-CM

## 2022-08-16 DIAGNOSIS — M869 Osteomyelitis, unspecified: Secondary | ICD-10-CM

## 2022-08-16 DIAGNOSIS — I5189 Other ill-defined heart diseases: Secondary | ICD-10-CM

## 2022-08-16 DIAGNOSIS — I10 Essential (primary) hypertension: Secondary | ICD-10-CM | POA: Diagnosis not present

## 2022-08-16 DIAGNOSIS — I5023 Acute on chronic systolic (congestive) heart failure: Secondary | ICD-10-CM

## 2022-08-16 HISTORY — PX: INCISION AND DRAINAGE OF WOUND: SHX1803

## 2022-08-16 LAB — MAGNESIUM: Magnesium: 2.3 mg/dL (ref 1.7–2.4)

## 2022-08-16 LAB — CBC WITH DIFFERENTIAL/PLATELET
Abs Immature Granulocytes: 0.03 10*3/uL (ref 0.00–0.07)
Basophils Absolute: 0.1 10*3/uL (ref 0.0–0.1)
Basophils Relative: 1 %
Eosinophils Absolute: 0.2 10*3/uL (ref 0.0–0.5)
Eosinophils Relative: 3 %
HCT: 35.5 % — ABNORMAL LOW (ref 39.0–52.0)
Hemoglobin: 11.4 g/dL — ABNORMAL LOW (ref 13.0–17.0)
Immature Granulocytes: 0 %
Lymphocytes Relative: 22 %
Lymphs Abs: 1.7 10*3/uL (ref 0.7–4.0)
MCH: 33.2 pg (ref 26.0–34.0)
MCHC: 32.1 g/dL (ref 30.0–36.0)
MCV: 103.5 fL — ABNORMAL HIGH (ref 80.0–100.0)
Monocytes Absolute: 0.8 10*3/uL (ref 0.1–1.0)
Monocytes Relative: 11 %
Neutro Abs: 4.7 10*3/uL (ref 1.7–7.7)
Neutrophils Relative %: 63 %
Platelets: 240 10*3/uL (ref 150–400)
RBC: 3.43 MIL/uL — ABNORMAL LOW (ref 4.22–5.81)
RDW: 14.4 % (ref 11.5–15.5)
WBC: 7.4 10*3/uL (ref 4.0–10.5)
nRBC: 0 % (ref 0.0–0.2)

## 2022-08-16 LAB — COMPREHENSIVE METABOLIC PANEL
ALT: 16 U/L (ref 0–44)
AST: 19 U/L (ref 15–41)
Albumin: 2.8 g/dL — ABNORMAL LOW (ref 3.5–5.0)
Alkaline Phosphatase: 28 U/L — ABNORMAL LOW (ref 38–126)
Anion gap: 8 (ref 5–15)
BUN: 30 mg/dL — ABNORMAL HIGH (ref 8–23)
CO2: 24 mmol/L (ref 22–32)
Calcium: 8.3 mg/dL — ABNORMAL LOW (ref 8.9–10.3)
Chloride: 108 mmol/L (ref 98–111)
Creatinine, Ser: 1.32 mg/dL — ABNORMAL HIGH (ref 0.61–1.24)
GFR, Estimated: 51 mL/min — ABNORMAL LOW (ref >60)
Glucose, Bld: 86 mg/dL (ref 70–99)
Potassium: 3.8 mmol/L (ref 3.5–5.1)
Sodium: 140 mmol/L (ref 135–145)
Total Bilirubin: 0.7 mg/dL (ref 0.3–1.2)
Total Protein: 5.2 g/dL — ABNORMAL LOW (ref 6.5–8.1)

## 2022-08-16 LAB — PHOSPHORUS: Phosphorus: 2.8 mg/dL (ref 2.5–4.6)

## 2022-08-16 SURGERY — IRRIGATION AND DEBRIDEMENT WOUND
Anesthesia: Monitor Anesthesia Care | Laterality: Right

## 2022-08-16 MED ORDER — CHLORHEXIDINE GLUCONATE 0.12 % MT SOLN
15.0000 mL | Freq: Once | OROMUCOSAL | Status: AC
  Administered 2022-08-16: 15 mL via OROMUCOSAL

## 2022-08-16 MED ORDER — OXYCODONE HCL 5 MG PO TABS
ORAL_TABLET | ORAL | Status: AC
  Filled 2022-08-16: qty 1

## 2022-08-16 MED ORDER — ONDANSETRON HCL 4 MG/2ML IJ SOLN
INTRAMUSCULAR | Status: AC
  Filled 2022-08-16: qty 2

## 2022-08-16 MED ORDER — FENTANYL CITRATE PF 50 MCG/ML IJ SOSY: 25.0000 ug | PREFILLED_SYRINGE | INTRAMUSCULAR | Status: DC | PRN

## 2022-08-16 MED ORDER — BUPIVACAINE HCL (PF) 0.5 % IJ SOLN
INTRAMUSCULAR | Status: DC | PRN
  Administered 2022-08-16: 23 mL

## 2022-08-16 MED ORDER — BUPIVACAINE HCL (PF) 0.5 % IJ SOLN
INTRAMUSCULAR | Status: AC
  Filled 2022-08-16: qty 30

## 2022-08-16 MED ORDER — VANCOMYCIN HCL 1000 MG IV SOLR
INTRAVENOUS | Status: AC
  Filled 2022-08-16: qty 20

## 2022-08-16 MED ORDER — PHENYLEPHRINE HCL (PRESSORS) 10 MG/ML IV SOLN
INTRAVENOUS | Status: DC | PRN
  Administered 2022-08-16 (×4): 80 ug via INTRAVENOUS

## 2022-08-16 MED ORDER — SODIUM CHLORIDE 0.9 % IV SOLN
2.0000 g | Freq: Once | INTRAVENOUS | Status: AC
  Administered 2022-08-16: 2 g via INTRAVENOUS
  Filled 2022-08-16: qty 12.5

## 2022-08-16 MED ORDER — OXYCODONE HCL 5 MG PO TABS
5.0000 mg | ORAL_TABLET | Freq: Once | ORAL | Status: AC | PRN
  Administered 2022-08-16: 5 mg via ORAL

## 2022-08-16 MED ORDER — DEXAMETHASONE SODIUM PHOSPHATE 10 MG/ML IJ SOLN
INTRAMUSCULAR | Status: AC
  Filled 2022-08-16: qty 1

## 2022-08-16 MED ORDER — OXYCODONE HCL 5 MG/5ML PO SOLN: 5.0000 mg | Freq: Once | ORAL | Status: AC | PRN

## 2022-08-16 MED ORDER — SODIUM CHLORIDE 0.9 % IV SOLN
2.0000 g | Freq: Two times a day (BID) | INTRAVENOUS | Status: DC
  Administered 2022-08-16 – 2022-08-17 (×2): 2 g via INTRAVENOUS
  Filled 2022-08-16 (×2): qty 12.5

## 2022-08-16 MED ORDER — SODIUM CHLORIDE 0.9 % IV SOLN: 1.0000 g | Freq: Once | INTRAVENOUS | Status: DC

## 2022-08-16 MED ORDER — PROPOFOL 500 MG/50ML IV EMUL
INTRAVENOUS | Status: AC
  Filled 2022-08-16: qty 50

## 2022-08-16 MED ORDER — LIDOCAINE HCL (PF) 2 % IJ SOLN
INTRAMUSCULAR | Status: AC
  Filled 2022-08-16: qty 5

## 2022-08-16 MED ORDER — ONDANSETRON HCL 4 MG/2ML IJ SOLN: 4.0000 mg | Freq: Once | INTRAMUSCULAR | Status: DC | PRN

## 2022-08-16 MED ORDER — LIDOCAINE HCL (CARDIAC) PF 100 MG/5ML IV SOSY
PREFILLED_SYRINGE | INTRAVENOUS | Status: DC | PRN
  Administered 2022-08-16: 60 mg via INTRATRACHEAL

## 2022-08-16 MED ORDER — PROPOFOL 500 MG/50ML IV EMUL
INTRAVENOUS | Status: DC | PRN
  Administered 2022-08-16: 90 ug/kg/min via INTRAVENOUS

## 2022-08-16 MED ORDER — LACTATED RINGERS IV SOLN: INTRAVENOUS | Status: DC

## 2022-08-16 MED ORDER — PROPOFOL 10 MG/ML IV BOLUS
INTRAVENOUS | Status: DC | PRN
  Administered 2022-08-16: 20 mg via INTRAVENOUS
  Administered 2022-08-16: 60 mg via INTRAVENOUS

## 2022-08-16 SURGICAL SUPPLY — 53 items
BAG COUNTER SPONGE SURGICOUNT (BAG) ×1 IMPLANT
BLADE SURG 15 STRL LF DISP TIS (BLADE) ×1 IMPLANT
BLADE SURG 15 STRL SS (BLADE) ×1
BNDG ELASTIC 3X5.8 VLCR STR LF (GAUZE/BANDAGES/DRESSINGS) ×1 IMPLANT
BNDG ELASTIC 4X5.8 VLCR STR LF (GAUZE/BANDAGES/DRESSINGS) ×1 IMPLANT
BNDG ESMARK 4X9 LF (GAUZE/BANDAGES/DRESSINGS) IMPLANT
BNDG GAUZE DERMACEA FLUFF 4 (GAUZE/BANDAGES/DRESSINGS) ×1 IMPLANT
CHLORAPREP W/TINT 26 (MISCELLANEOUS) IMPLANT
CNTNR URN SCR LID CUP LEK RST (MISCELLANEOUS) IMPLANT
CONT SPEC 4OZ STRL OR WHT (MISCELLANEOUS)
COVER BACK TABLE 60X90IN (DRAPES) ×1 IMPLANT
CUFF TOURN SGL QUICK 18X4 (TOURNIQUET CUFF) IMPLANT
CUFF TOURN SGL QUICK 24 (TOURNIQUET CUFF)
CUFF TRNQT CYL 24X4X16.5-23 (TOURNIQUET CUFF) IMPLANT
DRAPE 3/4 80X56 (DRAPES) ×1 IMPLANT
DRAPE EXTREMITY T 121X128X90 (DISPOSABLE) ×1 IMPLANT
DRAPE SHEET LG 3/4 BI-LAMINATE (DRAPES) ×1 IMPLANT
DRAPE U-SHAPE 47X51 STRL (DRAPES) ×1 IMPLANT
DRSG ADAPTIC 3X8 NADH LF (GAUZE/BANDAGES/DRESSINGS) IMPLANT
ELECT REM PT RETURN 15FT ADLT (MISCELLANEOUS) ×1 IMPLANT
GAUZE PAD ABD 7.5X8 STRL (GAUZE/BANDAGES/DRESSINGS) IMPLANT
GAUZE SPONGE 4X4 12PLY STRL (GAUZE/BANDAGES/DRESSINGS) ×1 IMPLANT
GAUZE XEROFORM 1X8 LF (GAUZE/BANDAGES/DRESSINGS) IMPLANT
GLOVE BIO SURGEON STRL SZ7.5 (GLOVE) ×1 IMPLANT
GLOVE BIOGEL PI IND STRL 8 (GLOVE) ×1 IMPLANT
GOWN STRL REUS W/ TWL XL LVL3 (GOWN DISPOSABLE) ×1 IMPLANT
GOWN STRL REUS W/TWL XL LVL3 (GOWN DISPOSABLE) ×1
GRAFT MYRIAD 3 LAYER 5X5 (Graft) IMPLANT
KIT BASIN OR (CUSTOM PROCEDURE TRAY) ×1 IMPLANT
KIT TURNOVER KIT A (KITS) IMPLANT
MANIFOLD NEPTUNE II (INSTRUMENTS) ×1 IMPLANT
NDL HYPO 25X1 1.5 SAFETY (NEEDLE) ×1 IMPLANT
NEEDLE HYPO 25X1 1.5 SAFETY (NEEDLE) ×1 IMPLANT
NS IRRIG 1000ML POUR BTL (IV SOLUTION) IMPLANT
PADDING CAST ABS COTTON 4X4 ST (CAST SUPPLIES) ×1 IMPLANT
SET IRRIG Y TYPE TUR BLADDER L (SET/KITS/TRAYS/PACK) IMPLANT
SPONGE T-LAP 4X18 ~~LOC~~+RFID (SPONGE) ×1 IMPLANT
STAPLER VISISTAT 35W (STAPLE) IMPLANT
STOCKINETTE 6  STRL (DRAPES) ×1
STOCKINETTE 6 STRL (DRAPES) ×1 IMPLANT
SUCTION FRAZIER HANDLE 10FR (MISCELLANEOUS)
SUCTION TUBE FRAZIER 10FR DISP (MISCELLANEOUS) IMPLANT
SUT ETHILON 3 0 PS 1 (SUTURE) IMPLANT
SUT ETHILON 4 0 PS 2 18 (SUTURE) IMPLANT
SUT MNCRL AB 3-0 PS2 18 (SUTURE) IMPLANT
SUT MNCRL AB 4-0 PS2 18 (SUTURE) IMPLANT
SUT VIC AB 2-0 SH 27 (SUTURE)
SUT VIC AB 2-0 SH 27XBRD (SUTURE) IMPLANT
SYR BULB EAR ULCER 3OZ GRN STR (SYRINGE) ×1 IMPLANT
SYR CONTROL 10ML LL (SYRINGE) ×1 IMPLANT
TUBE IRRIGATION SET MISONIX (TUBING) IMPLANT
UNDERPAD 30X36 HEAVY ABSORB (UNDERPADS AND DIAPERS) ×1 IMPLANT
YANKAUER SUCT BULB TIP NO VENT (SUCTIONS) ×1 IMPLANT

## 2022-08-16 NOTE — Anesthesia Postprocedure Evaluation (Signed)
Anesthesia Post Note  Patient: Hector Vazquez  Procedure(s) Performed: IRRIGATION AND DEBRIDEMENT WOUND (Right)     Patient location during evaluation: PACU Anesthesia Type: MAC Level of consciousness: awake and alert Pain management: pain level controlled Vital Signs Assessment: post-procedure vital signs reviewed and stable Respiratory status: spontaneous breathing, nonlabored ventilation, respiratory function stable and patient connected to nasal cannula oxygen Cardiovascular status: stable and blood pressure returned to baseline Postop Assessment: no apparent nausea or vomiting Anesthetic complications: no  No notable events documented.  Last Vitals:  Vitals:   08/16/22 1711 08/16/22 1715  BP: (!) 80/64 (!) 97/55  Pulse: 96 99  Resp: 15 19  Temp: 36.4 C   SpO2: 99% 99%    Last Pain:  Vitals:   08/16/22 1715  TempSrc:   PainSc: Asleep                 Marcie Shearon S

## 2022-08-16 NOTE — Consult Note (Signed)
Cardiology Consultation   Patient ID: Hector Vazquez MRN: [id number]; DOB: 1929/12/01  Admit date: 08/13/2022 Date of Consult: 08/16/2022  PCP:  [facility] [place] [facility] Cardiologist:  None        Patient Profile:   Hector Vazquez is a 89 y.o. male with a hx of persistent afib, hypertension, CKD III, hyperlipidemia, chronic osteomyelitis of right calcaneus who is being seen 08/16/2022 for the evaluation of surgical risk at the request of Dr. Tana Coast.  History of Present Illness:   Mr. Verret presented to the ER on 08/13/22 at the suggestion of his wound care physician with [facility] for further evaluation of exposed bone of right first metatarsal head. Patient was advised that he needed an MRI. Appears that his last wound clinic visit was on 08/09/22 and it was noted that wound cultures had grown Pseudomonas aeruginosa, sensitive to Cipro. He had apparently already been placed on Empiric doxycycline at the time cultures were obtained, was then switched to Cipro based on susceptibility.     A review of patient's chart shows that he was admitted to [facility] on 06/20/22 following a fall at home. As a part of his workup, TTE was completed showing LVEF 40-45% with BNP elevated to 741.2. Patient was diuresed and was also referred back to wound clinic due to worsening chronic right foot ulcer.   Patient is followed by Dr. Ola Spurr with [facility]. He has historically been followed by the arrhythmia clinic for several years after failing multiple membrane stabilizing drugs. Per 03/14/22 office visit notes, patient was doing well on Warfarin and Diltiazem. However subsequent notes from his PCP earlier this month, Coumadin was stopped by the coumadin clinic. It is presumed that this is due to his fall risk though I cannot find specific documentation from cardiology or coumadin clinic stating that this is the reason. Appears that official discontinuation date was  07/02/22.  On exam today, patient reports that aside from some functional mobility limitations, he does not feel restricted in his exertion.  Patient tells me "I could walk a mile without feeling short of breath or having chest pain."  While there may be some component of exaggeration here, he does live independently and is able to complete all activities of daily living.  He does receive home health benefits, but it appears that this is primarily related to wound care.  He confirms a history of being told that he has some heart failure but says that he has never undergone ischemic workup.  Denies history of heart catheterization.  Past Medical History:  Diagnosis Date   Coronary artery disease     History reviewed. No pertinent surgical history.   Home Medications:  Prior to Admission medications   Medication Sig Start Date End Date Taking? Authorizing Provider  b complex vitamins capsule Take 1 capsule by mouth daily. 09/03/18  Yes [provider]  Cholecalciferol (VITAMIN D3) 25 MCG (1000 UT) CAPS Take 1,000 Units by mouth daily.   Yes [provider]  ciprofloxacin (CIPRO) 500 MG tablet Take 500 mg by mouth 2 (two) times daily. 08/09/22 08/23/22 Yes [provider]  diltiazem (CARDIZEM CD) 240 MG 24 hr capsule Take 240 mg by mouth daily.   Yes [provider]  doxycycline (MONODOX) 100 MG capsule Take 100 mg by mouth 2 (two) times daily. 08/02/22 08/16/22 Yes [provider]  lovastatin (MEVACOR) 20 MG tablet Take 20 mg by mouth at bedtime. 12/12/20  Yes [provider]  magnesium oxide (MAG-OX) 400 (240 Mg) MG tablet Take 400 mg by mouth daily.   Yes [provider]  metoprolol succinate (TOPROL-XL) 25 MG 24 hr tablet Take 25 mg by mouth daily at 12 noon. 02/17/21  Yes [provider]  torsemide (DEMADEX) 20 MG tablet Take 20 mg by mouth daily.   Yes [provider]  midodrine (PROAMATINE) 5 MG tablet Take 1 tablet  (5 mg total) by mouth 3 (three) times daily with meals. Patient not taking: Reported on 08/14/2022 07/02/22   Patrecia Pour, MD    Inpatient Medications: Scheduled Meds:  aspirin EC  81 mg Oral Daily   heparin  5,000 Units Subcutaneous Q8H   metoprolol succinate  12.5 mg Oral Daily   Continuous Infusions:  PRN Meds: acetaminophen **OR** acetaminophen, ondansetron **OR** ondansetron (ZOFRAN) IV  Allergies:   No Known Allergies  Social History:   Social History   Socioeconomic History   Marital status: Married    Spouse name: Not on file   Number of children: Not on file   Years of education: Not on file   Highest education level: Not on file  Occupational History   Not on file  Tobacco Use   Smoking status: Never   Smokeless tobacco: Never  Vaping Use   Vaping Use: Never used  Substance and Sexual Activity   Alcohol use: Never   Drug use: Never   Sexual activity: Never  Other Topics Concern   Not on file  Social History Narrative   Not on file   Social Determinants of Health   Financial Resource Strain: Not on file  Food Insecurity: No Food Insecurity (08/14/2022)   Hunger Vital Sign    Worried About Running Out of Food in the Last Year: Never true    Ran Out of Food in the Last Year: Never true  Transportation Needs: No Transportation Needs (08/14/2022)   PRAPARE - Hydrologist (Medical): No    Lack of Transportation (Non-Medical): No  Physical Activity: Not on file  Stress: Not on file  Social Connections: Not on file  Intimate Partner Violence: Not At Risk (08/14/2022)   Humiliation, Afraid, Rape, and Kick questionnaire    Fear of Current or Ex-Partner: No    Emotionally Abused: No    Physically Abused: No    Sexually Abused: No    Family History:   History reviewed. No pertinent family history.   ROS:  Please see the history of present illness.   All other ROS reviewed and negative.     Physical Exam/Data:   Vitals:    08/15/22 1221 08/15/22 1932 08/16/22 0423 08/16/22 0500  BP: 98/61 103/72 109/71   Pulse: 69 97 70   Resp: 16 16 16    Temp: (!) 97.5 F (36.4 C) 97.6 F (36.4 C) 98.1 F (36.7 C)   TempSrc: Oral Oral Oral   SpO2: 98% 98% 98%   Weight:    83.2 kg  Height:        Intake/Output Summary (Last 24 hours) at 08/16/2022 1010 Last data filed at 08/16/2022 0600 Gross per 24 hour  Intake --  Output 1000 ml  Net -1000 ml      08/16/2022    5:00 AM 08/15/2022    2:58 AM 08/14/2022    4:42 AM  Last 3 Weights  Weight (lbs) 183 lb 6.8 oz 182 lb 8.7 oz 180 lb 8.9 oz  Weight (  kg) 83.2 kg 82.8 kg 81.9 kg     Body mass index is 22.93 kg/m.  General:  Well nourished, well developed, in no acute distress HEENT: normal Neck: no JVD Vascular: No carotid bruits; Distal pulses 2+ bilaterally Cardiac:  normal S1, S2; irregularly irregular; no murmur  Lungs:  clear to auscultation bilaterally, no wheezing, rhonchi or rales  Abd: soft, nontender, no hepatomegaly  Ext: Right foot wrapped in dressing material. No edema appreciated in either leg. Musculoskeletal:  No deformities, BUE and BLE strength normal and equal Skin: warm and dry  Neuro:  CNs 2-12 intact, no focal abnormalities noted Psych:  Normal affect   EKG:  The EKG was personally reviewed and demonstrates: Atrial fibrillation, rate controlled.  Borderline ST depression in V6.  ECG appears stable/consistent with 06/20/2022 ECG Telemetry: Patient is not on telemetry at this time  Relevant CV Studies:  06/21/22 TTE  IMPRESSIONS     1. EF challenging in the setting of atrial fibrillation. SVi is low 21  cc/m2. Left ventricular ejection fraction, by estimation, is 40 to 45%.  The left ventricle has mildly decreased function. The left ventricle  demonstrates global hypokinesis. Left  ventricular diastolic parameters are indeterminate.   2. Right ventricular systolic function is normal. The right ventricular  size is mildly enlarged.  There is normal pulmonary artery systolic  pressure.   3. Left atrial size was moderately dilated.   4. The mitral valve was not well visualized. Mild mitral valve  regurgitation.   5. The aortic valve is tricuspid. Aortic valve regurgitation is mild.   6. Aneurysm of the aortic root, measuring 40 mm.   7. The inferior vena cava is normal in size with greater than 50%  respiratory variability, suggesting right atrial pressure of 3 mmHg.   FINDINGS   Left Ventricle: EF challenging in the setting of atrial fibrillation. SVi  is low 21 cc/m2. Left ventricular ejection fraction, by estimation, is 40  to 45%. The left ventricle has mildly decreased function. The left  ventricle demonstrates global  hypokinesis. The left ventricular internal cavity size was normal in size.  There is no left ventricular hypertrophy. Left ventricular diastolic  parameters are indeterminate.   Right Ventricle: The right ventricular size is mildly enlarged. Right  ventricular systolic function is normal. There is normal pulmonary artery  systolic pressure. The tricuspid regurgitant velocity is 2.69 m/s, and  with an assumed right atrial pressure  of 3 mmHg, the estimated right ventricular systolic pressure is 31.9 mmHg.   Left [facility]: Left atrial size was moderately dilated.   Right [facility]: Right atrial size was normal in size.   Pericardium: There is no evidence of pericardial effusion.   Mitral Valve: The mitral valve was not well visualized. Mild mitral valve  regurgitation.   Tricuspid Valve: Tricuspid valve regurgitation is mild.   Aortic Valve: The aortic valve is tricuspid. Aortic valve regurgitation is  mild. Aortic regurgitation PHT measures 614 msec. Aortic valve mean  gradient measures 3.0 mmHg. Aortic valve peak gradient measures 4.8 mmHg.  Aortic valve area, by VTI measures  2.37 cm.   Pulmonic Valve: Pulmonic valve regurgitation is not visualized.   Aorta: There is an aneurysm  involving the aortic root measuring 40 mm.   Venous: The inferior vena cava is normal in size with greater than 50%  respiratory variability, suggesting right atrial pressure of 3 mmHg.   IAS/Shunts: No atrial level shunt detected by color flow Doppler.   Laboratory Data:  High Sensitivity Troponin:  No results for input(s): "TROPONINIHS" in the last 720 hours.   Chemistry Recent Labs  Lab 08/13/22 2008 08/14/22 0417 08/14/22 1047 08/16/22 0348  NA 137 140  --  140  K 3.4* 3.1*  --  3.8  CL 100 103  --  108  CO2 22 26  --  24  GLUCOSE 107* 93  --  86  BUN 68* 51*  --  30*  CREATININE 2.56* 2.13*  --  1.32*  CALCIUM 8.6* 8.0*  --  8.3*  MG  --   --  2.4 2.3  GFRNONAA 23* 29*  --  51*  ANIONGAP 15 11  --  8    Recent Labs  Lab 08/14/22 0417 08/16/22 0348  PROT 5.7* 5.2*  ALBUMIN 2.8* 2.8*  AST 20 19  ALT 15 16  ALKPHOS 29* 28*  BILITOT 0.6 0.7   Lipids No results for input(s): "CHOL", "TRIG", "HDL", "LABVLDL", "LDLCALC", "CHOLHDL" in the last 168 hours.  Hematology Recent Labs  Lab 08/13/22 2008 08/14/22 0417 08/16/22 0348  WBC 9.3 7.7 7.4  RBC 3.70* 3.40* 3.43*  HGB 12.4* 11.5* 11.4*  HCT 38.1* 34.8* 35.5*  MCV 103.0* 102.4* 103.5*  MCH 33.5 33.8 33.2  MCHC 32.5 33.0 32.1  RDW 14.2 14.4 14.4  PLT 246 218 240   Thyroid No results for input(s): "TSH", "FREET4" in the last 168 hours.  BNPNo results for input(s): "BNP", "PROBNP" in the last 168 hours.  DDimer No results for input(s): "DDIMER" in the last 168 hours.   Radiology/Studies:  VAS [place] ABI WITH/WO TBI  Result Date: 08/14/2022  LOWER EXTREMITY DOPPLER STUDY Patient Name:  Dimarco Bernardy  Date of Exam:   08/14/2022 Medical Rec #: [id number]    Accession #:    [id number] Date of Birth: August 14, 1929     Patient Gender: M Patient Age:   49 years Exam Location:  [facility] Procedure:      VAS [place] ABI WITH/WO TBI Referring Phys: ERIC CHEN  --------------------------------------------------------------------------------  Indications: Ulceration. High Risk Factors: Coronary artery disease.  Comparison Study: no prior Performing Technologist: Archie Patten RVS  Examination Guidelines: A complete evaluation includes at minimum, Doppler waveform signals and systolic blood pressure reading at the level of bilateral brachial, anterior tibial, and posterior tibial arteries, when vessel segments are accessible. Bilateral testing is considered an integral part of a complete examination. Photoelectric Plethysmograph (PPG) waveforms and toe systolic pressure readings are included as required and additional duplex testing as needed. Limited examinations for reoccurring indications may be performed as noted.  ABI Findings: +---------+------------------+-----+---------+--------+ Right    Rt Pressure (mmHg)IndexWaveform Comment  +---------+------------------+-----+---------+--------+ Brachial 100                    triphasic         +---------+------------------+-----+---------+--------+ PTA      128               1.25 triphasic         +---------+------------------+-----+---------+--------+ DP       102               1.00 triphasic         +---------+------------------+-----+---------+--------+ Great Toe86                0.84 Normal            +---------+------------------+-----+---------+--------+ +---------+------------------+-----+---------+-------+ Left     Lt Pressure (mmHg)IndexWaveform Comment +---------+------------------+-----+---------+-------+ Brachial 102  triphasic        +---------+------------------+-----+---------+-------+ PTA      128               1.25 triphasic        +---------+------------------+-----+---------+-------+ DP       126               1.24 triphasic        +---------+------------------+-----+---------+-------+ Great Toe97                0.95 Normal            +---------+------------------+-----+---------+-------+ +-------+-----------+-----------+------------+------------+ ABI/TBIToday's ABIToday's TBIPrevious ABIPrevious TBI +-------+-----------+-----------+------------+------------+ Right  1.25       0.84                                +-------+-----------+-----------+------------+------------+ Left   1.25       0.95                                +-------+-----------+-----------+------------+------------+  Summary: Right: Resting right ankle-brachial index is within normal range. The right toe-brachial index is normal. Left: Resting left ankle-brachial index is within normal range. The left toe-brachial index is normal. *See table(s) above for measurements and observations.  Electronically signed by Deitra Mayo MD on 08/14/2022 at 4:18:46 PM.    Final    MR FOOT RIGHT W WO CONTRAST  Result Date: 08/14/2022 CLINICAL DATA:  Osteomyelitis, foot right heel and right forefoot osteomyelitis EXAM: MRI OF THE RIGHT FOREFOOT WITHOUT AND WITH CONTRAST; MRI OF THE RIGHT ANKLE WITHOUT AND WITH CONTRAST TECHNIQUE: Multiplanar, multisequence MR imaging of the right foot and ankle was performed before and after the administration of intravenous contrast. CONTRAST:  63mL GADAVIST GADOBUTROL 1 MMOL/ML IV SOLN COMPARISON:  Foot radiograph 08/13/2022. FINDINGS: MRI foot Bones/Joint/Cartilage There is marrow edema and confluent low T1 signal within the great toe medial sesamoid (axial T1 image 25). There is also marrow edema within the first metatarsal head and plantar aspect of the base of the proximal phalanx. There is associated marrow enhancement. There is a first MTP joint effusion with lobular heterogeneous fluid and thick synovial enhancement. No other significant marrow signal alteration in the forefoot. Ligaments Intact Lisfranc ligament.  Probable great toe plantar plate tear. Muscles and Tendons Diffuse muscle atrophy consistent with chronic  denervation. Severe tendinosis of the flexor hallucis tendon. Soft tissues Diffuse soft tissue swelling of the foot. Medial forefoot soft tissue enhancement and swelling with plantar ulcer (axial postcontrast image 27). No evidence of soft tissue abscess. MRI ankle TENDONS Peroneal: There is a complete tear of the peroneal brevis tendon at the lateral malleolus, with distal stump at the level of the lateral calcaneus and the proximal stump above the malleolus, tendon gap measuring approximately 5.7 cm, likely chronic. The peroneal longus tendon is intact. Minimal reactive peroneal tenosynovitis. Posteromedial: Intact tibialis posterior, flexor hallucis longus and flexor digitorum longus tendons. Anterior: Intact tibialis anterior, extensor hallucis longus and extensor digitorum longus tendons. Achilles: There is insertional Achilles tendinosis. No significant retrocalcaneal bursitis. Plantar Fascia: There is thickening of the central band plantar fascia compatible with chronic/prior plantar fasciitis. LIGAMENTS Lateral: Torn anterior talofibular ligament and calcaneofibular ligament. Posterior talofibular ligament intact. Anterior and posterior tibiofibular ligaments intact. Medial: Torn deep deltoid ligament.  Spring ligament intact. CARTILAGE Ankle Joint: Small tibiotalar joint effusion. Tibiotalar osteophyte formation with  subchondral marrow edema and cystic change and high-grade cartilage loss with bony remodeling. Subtalar Joints/Sinus Tarsi: Mild-to-moderate posterior middle subtalar osteoarthritis. Loss of fat signal in the sinus tarsi. Moderate joint effusion. Bones: Hindfoot valgus with extra-articular subcortical marrow edema and cystic change in the lateral hindfoot with sub fibular soft tissue edema. No convincing marrow signal abnormality suggesting osteomyelitis. Plantar calcaneal spur. Soft Tissue: Extensive soft tissue swelling of the ankle and foot. Soft tissue enhancement in the plantar hindfoot  with potential tiny sinus tract noted (coronal postcontrast images 20 2-23, within the subcutaneous tissues extending in close proximity to the central band plantar fascia. There is no evidence of a well-defined/drainable soft tissue abscess in the hindfoot. Diffuse muscle atrophy consistent with chronic denervation. 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 Adjacent soft tissue cellulitis and plantar ulcer. Hindfoot valgus with findings of lateral hindfoot impingement. No convincing evidence of osteomyelitis in the hindfoot. Plantar heel soft tissue cellulitis with possible tiny sinus tract extending in close proximity to the central band plantar fascia. No evidence of a well-defined, drainable soft tissue abscess in the hindfoot. 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 Mild-to-moderate posterior middle subtalar osteoarthritis. Small tibiotalar and moderate posterior subtalar effusions. Torn anterior talofibular ligament, calcaneofibular ligament, and deep deltoid ligament. Electronically Signed   By: Maurine Simmering M.D.   On: 08/14/2022 08:32   MR ANKLE RIGHT W WO CONTRAST  Result Date: 08/14/2022 CLINICAL DATA:  Osteomyelitis, foot right heel and right forefoot osteomyelitis EXAM: MRI OF THE RIGHT FOREFOOT WITHOUT AND WITH CONTRAST; MRI OF THE RIGHT ANKLE WITHOUT AND WITH CONTRAST TECHNIQUE: Multiplanar, multisequence MR imaging of the right foot and ankle was performed before and after the administration of intravenous contrast. CONTRAST:  64mL GADAVIST GADOBUTROL 1 MMOL/ML IV SOLN COMPARISON:  Foot radiograph 08/13/2022. FINDINGS: MRI foot Bones/Joint/Cartilage There is marrow edema and confluent low T1 signal within the great toe medial sesamoid (axial T1 image 25). There is also marrow edema within the first  metatarsal head and plantar aspect of the base of the proximal phalanx. There is associated marrow enhancement. There is a first MTP joint effusion with lobular heterogeneous fluid and thick synovial enhancement. No other significant marrow signal alteration in the forefoot. Ligaments Intact Lisfranc ligament.  Probable great toe plantar plate tear. Muscles and Tendons Diffuse muscle atrophy consistent with chronic denervation. Severe tendinosis of the flexor hallucis tendon. Soft tissues Diffuse soft tissue swelling of the foot. Medial forefoot soft tissue enhancement and swelling with plantar ulcer (axial postcontrast image 27). No evidence of soft tissue abscess. MRI ankle TENDONS Peroneal: There is a complete tear of the peroneal brevis tendon at the lateral malleolus, with distal stump at the level of the lateral calcaneus and the proximal stump above the malleolus, tendon gap measuring approximately 5.7 cm, likely chronic. The peroneal longus tendon is intact. Minimal reactive peroneal tenosynovitis. Posteromedial: Intact tibialis posterior, flexor hallucis longus and flexor digitorum longus tendons. Anterior: Intact tibialis anterior, extensor hallucis longus and extensor digitorum longus tendons. Achilles: There is insertional Achilles tendinosis. No significant retrocalcaneal bursitis. Plantar Fascia: There is thickening of the central band plantar fascia compatible with chronic/prior plantar fasciitis. LIGAMENTS Lateral: Torn anterior talofibular ligament and calcaneofibular ligament. Posterior talofibular ligament intact. Anterior and posterior tibiofibular ligaments intact. Medial: Torn deep deltoid ligament.  Spring ligament intact. CARTILAGE Ankle Joint: Small tibiotalar  joint effusion. Tibiotalar osteophyte formation with subchondral marrow edema and cystic change and high-grade cartilage loss with bony remodeling. Subtalar Joints/Sinus Tarsi: Mild-to-moderate posterior middle subtalar osteoarthritis.  Loss of fat signal in the sinus tarsi. Moderate joint effusion. Bones: Hindfoot valgus with extra-articular subcortical marrow edema and cystic change in the lateral hindfoot with sub fibular soft tissue edema. No convincing marrow signal abnormality suggesting osteomyelitis. Plantar calcaneal spur. Soft Tissue: Extensive soft tissue swelling of the ankle and foot. Soft tissue enhancement in the plantar hindfoot with potential tiny sinus tract noted (coronal postcontrast images 20 2-23, within the subcutaneous tissues extending in close proximity to the central band plantar fascia. There is no evidence of a well-defined/drainable soft tissue abscess in the hindfoot. Diffuse muscle atrophy consistent with chronic denervation. 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 Adjacent soft tissue cellulitis and plantar ulcer. Hindfoot valgus with findings of lateral hindfoot impingement. No convincing evidence of osteomyelitis in the hindfoot. Plantar heel soft tissue cellulitis with possible tiny sinus tract extending in close proximity to the central band plantar fascia. No evidence of a well-defined, drainable soft tissue abscess in the hindfoot. 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 Mild-to-moderate posterior middle subtalar osteoarthritis. Small tibiotalar and moderate posterior subtalar effusions. Torn anterior talofibular ligament, calcaneofibular ligament, and deep deltoid ligament. Electronically Signed   By: Maurine Simmering M.D.   On: 08/14/2022 08:32   DG Skull 1-3 Views  Result Date: 08/13/2022 CLINICAL DATA:  Osteomyelitis.  Metal foreign body screen. EXAM: SKULL - 1-3 VIEW COMPARISON:  None Available. FINDINGS: There is no evidence of skull fracture or other focal bone lesions. Dental hardware noted. No unexpected  intra orbital metallic foreign body IMPRESSION: 1. No unexpected intra orbital metallic foreign body. . Electronically Signed   By: Fidela Salisbury M.D.   On: 08/13/2022 23:28   DG Abd Portable 1V  Result Date: 08/13/2022 CLINICAL DATA:  Osteomyelitis.  Metal screening for MRI. EXAM: PORTABLE ABDOMEN - 1 VIEW COMPARISON:  None Available. FINDINGS: Normal abdominal gas pattern. No gross free intraperitoneal gas. Surgical clips and surgical skin staples are noted within the lower quadrant the abdomen bilaterally. Right total hip arthroplasty has been performed. No acute bone abnormality. IMPRESSION: 1. Normal abdominal gas pattern. No gross free intraperitoneal gas. Surgical clips and skin staples possibly relate to bilateral inguinal hernia repair. No unexpected metallic foreign body within the abdomen. Electronically Signed   By: Fidela Salisbury M.D.   On: 08/13/2022 23:27   DG CHEST PORT 1 VIEW  Result Date: 08/13/2022 CLINICAL DATA:  Metal screening, osteomyelitis EXAM: PORTABLE CHEST 1 VIEW COMPARISON:  None Available. FINDINGS: Lungs are clear. No pneumothorax or pleural effusion. Cardiac size within normal limits. Pulmonary vascularity is normal. No unexpected metallic foreign body noted within the thorax. IMPRESSION: 1. No active disease. No unexpected metallic foreign body. Electronically Signed   By: Fidela Salisbury M.D.   On: 08/13/2022 23:26   DG Foot Complete Right  Result Date: 08/13/2022 CLINICAL DATA:  Plantar ulcers at heel and first metatarsal EXAM: RIGHT FOOT COMPLETE - 3+ VIEW COMPARISON:  1124 FINDINGS: Osseous demineralization. Advanced degenerative changes of the ankle joint. Remaining joint spaces preserved. Questionable loss of cortical margination at the plantar aspect of the posterior calcaneus, concerning for subtle osteomyelitis. No acute fracture, dislocation, or additional bone destruction. Specifically, no definite bone destruction  is seen at the plantar aspect of the first MTP  joint at site of ulcer. IMPRESSION: Question subtle osteomyelitis of the plantar surface of the posterior calcaneus, similar to prior exam; this could be better assessed by MR. Advanced degenerative changes at ankle. Electronically Signed   By: Lavonia Dana M.D.   On: 08/13/2022 20:43     Assessment and Plan:   Chronic combined systolic and diastolic CHF  Patient followed by [facility], Dr. Ola Spurr. Appears that historical echocardiograms have showed preserved LVEF, though his most recent echo in November 2023 showed LVEF 40-45% with global hypokinesis. Patient currently admitted with subacute osteomyelitis of right foot and is tentatively planned for surgical debridement. Cardiology consulted for operative risk stratification.  Patient does not report any clinical evidence of heart failure including exertional intolerance, chest pain, orthopnea/dyspnea.  Suspect that his reduced EF per 06/21/2022 echocardiogram is in part a result of difficult interpretation due to chronic atrial fibrillation.  Given chronicity of his A-fib, this may also be a contributing factor and a slightly reduced ejection fraction. The patient does not have any known unstable cardiac conditions.  Upon evaluation today, he can achieve 4 METs or greater without anginal symptoms.  According to [facility] and AHA guidelines, he requires no further cardiac workup prior to his noncardiac surgery and should be at acceptable risk.  Our service is available as necessary in the perioperative period. Per revised cardiac risk index, patient's perioperative risk of major cardiac event is 0.9%. Of note, this score calculated with patient's current creatinine <2.0. His admission creatinine was elevated about 2 with AKI and if patient had recurrence, risk increases to 6.6%.   Permanent atrial fibrillation  Patient currently rate controlled with Metoprolol Succinate 12.5mg . Previously was on Coumadin though discontinued in December,  presumably due to fall risk.   Continue with beta-blocker for rate control Patient should follow-up with his outpatient cardiologist regarding risks/benefits of anticoagulation  Per primary team:  Subacute osteomyelitis of right foot AKI Hypertension Hypokalemia  Risk Assessment/Risk Scores:          CHA2DS2-VASc Score = 4   This indicates a 4.8% annual risk of stroke. The patient's score is based upon: CHF History: 1 HTN History: 1 Diabetes History: 0 Stroke History: 0 Vascular Disease History: 0 Age Score: 2 Gender Score: 0         For questions or updates, please contact [place] Please consult [email or web address] for contact info under    Signed, Lily Kocher, PA-C  08/16/2022 10:10 AM

## 2022-08-16 NOTE — Progress Notes (Signed)
[facility] for Infectious Disease  Date of Admission:  08/13/2022   Total days of inpatient antibiotics 2  Principal Problem:   Subacute osteomyelitis of right foot (HCC) Active Problems:   Essential hypertension   Stage 3a chronic kidney disease (CKD) (HCC) - baseline SCr 1.6   Unspecified hearing loss, unspecified ear   Heart failure with reduced ejection fraction and diastolic dysfunction (HCC)   AKI (acute kidney injury) (HCC)   PAF (paroxysmal atrial fibrillation) (HCC)   Osteomyelitis (HCC)          Assessment: 89 year old male with PAF not on AC due to fall risk, CKD stage III admitted for right foot wound involving the first metatarsal head, calcaneus.  Patient is on cefepime. #Osteomyelitis of right foot involving 1st MTP and septic arthritis of 1st MTP #CKD -Vital stable on admission, no leukocytosis.  MRI of right foot showed septic arthritis of first MTP joint with osteomyelitis, adjacent soft tissue cellulitis plantar heel soft tissue cellulitis with possible tiny sinus tract distended close proximity of central band plantar fascia. -Patient is followed by [facility] wound care, Dr. Welton Flakes.  On 08/02/2022 noted that he was not wearing his postop shoe, wearing regular shoe.  Noted that first [place] wound was blistered.  Patient underwent debridement and cultures taken of tendon which grew Pseudomonas.  No visible bone.  But concern for osteomyelitis. Pt then started on cipro, care taker states pt was on doxy prior to this.  Recommendations:  - Plan on OR for debridement, please obtain cultures. Start cipro following OR. - As no bone was biopsied by wound care, will await bone biopsy prior to committing to a long course of fluoroquinolone versus antibiotics in a 89 year old male with multiple comorbidities.   Microbiology:   Antibiotics: Ciprofloxacin 1/22-23 Cefepime 1/22-   Cultures:   Other 1/23 wound swab NG  SUBJECTIVE: Resting in bed. Care taker  at bedside Interval: Afebrile over night, wbc 7.4k  Review of Systems: Review of Systems  All other systems reviewed and are negative.    Scheduled Meds:  aspirin EC  81 mg Oral Daily   heparin  5,000 Units Subcutaneous Q8H   metoprolol succinate  12.5 mg Oral Daily   Continuous Infusions: PRN Meds:.acetaminophen **OR** acetaminophen, ondansetron **OR** ondansetron (ZOFRAN) IV No Known Allergies  OBJECTIVE: Vitals:   08/15/22 1932 08/16/22 0423 08/16/22 0500 08/16/22 1355  BP: 103/72 109/71  114/89  Pulse: 97 70  (!) 48  Resp: 16 16  14   Temp: 97.6 F (36.4 C) 98.1 F (36.7 C)  (!) 97.4 F (36.3 C)  TempSrc: Oral Oral  Oral  SpO2: 98% 98%  99%  Weight:   83.2 kg   Height:       Body mass index is 22.93 kg/m.  Physical Exam Constitutional:      General: He is not in acute distress.    Appearance: He is normal weight. He is not toxic-appearing.  HENT:     Head: Normocephalic and atraumatic.     Right Ear: External ear normal.     Left Ear: External ear normal.     Nose: No congestion or rhinorrhea.     Mouth/Throat:     Mouth: Mucous membranes are moist.     Pharynx: Oropharynx is clear.  Eyes:     Extraocular Movements: Extraocular movements intact.     Conjunctiva/sclera: Conjunctivae normal.     Pupils: Pupils are equal, round, and reactive to  light.  Cardiovascular:     Rate and Rhythm: Normal rate and regular rhythm.     Heart sounds: No murmur heard.    No friction rub. No gallop.  Pulmonary:     Effort: Pulmonary effort is normal.     Breath sounds: Normal breath sounds.  Abdominal:     General: Abdomen is flat. Bowel sounds are normal.     Palpations: Abdomen is soft.  Musculoskeletal:     Cervical back: Normal range of motion and neck supple.  Skin:    General: Skin is warm and dry.  Neurological:     General: No focal deficit present.     Mental Status: He is oriented to person, place, and time.  Psychiatric:        Mood and Affect: Mood  normal.       Lab Results Lab Results  Component Value Date   WBC 7.4 08/16/2022   HGB 11.4 (L) 08/16/2022   HCT 35.5 (L) 08/16/2022   MCV 103.5 (H) 08/16/2022   PLT 240 08/16/2022    Lab Results  Component Value Date   CREATININE 1.32 (H) 08/16/2022   BUN 30 (H) 08/16/2022   NA 140 08/16/2022   K 3.8 08/16/2022   CL 108 08/16/2022   CO2 24 08/16/2022    Lab Results  Component Value Date   ALT 16 08/16/2022   AST 19 08/16/2022   ALKPHOS 28 (L) 08/16/2022   BILITOT 0.7 08/16/2022        Laurice Record, [place] for Infectious [facility] 08/16/2022, 2:09 PM

## 2022-08-16 NOTE — Transfer of Care (Signed)
Immediate Anesthesia Transfer of Care Note  Patient: Hector Vazquez  Procedure(s) Performed: Procedure(s) with comments: IRRIGATION AND DEBRIDEMENT WOUND (Right) - Right foot wound debridement x 2, seamoid excision, bone biopsy, possible graft Surgeon available at 1530.  Patient Location: PACU   Anesthesia Type:MAC  Level of Consciousness: awake, alert  and oriented  Airway & Oxygen Therapy: Patient Spontanous Breathing and Patient connected to nasal cannula oxygen  Post-op Assessment: Report given to RN and Post -op Vital signs reviewed and stable  Post vital signs: Reviewed and stable  Last Vitals:  Vitals:   08/16/22 1355 08/16/22 1535  BP: 114/89 (!) 89/60  Pulse: (!) 48 94  Resp: 14 15  Temp: (!) 36.3 C 36.6 C  SpO2: 70% 35%    Complications: No apparent anesthesia complications

## 2022-08-16 NOTE — Brief Op Note (Signed)
08/16/2022  5:06 PM  PATIENT:  Hector Vazquez  89 y.o. male  PRE-OPERATIVE DIAGNOSIS:  osteomyelitis right foot  POST-OPERATIVE DIAGNOSIS:  * No post-op diagnosis entered *  PROCEDURE:  Procedure(s) with comments: IRRIGATION AND DEBRIDEMENT WOUND (Right) - Right foot wound debridement x 2, seamoid excision, bone biopsy, possible graft Surgeon available at 1530.  SURGEON:  Surgeon(s) and Role:    * Luby Seamans, Stephan Minister, DPM - Primary  PHYSICIAN ASSISTANT:   ASSISTANTS: none   ANESTHESIA:   local and MAC  EBL:  10cc   BLOOD ADMINISTERED:none  DRAINS: none   LOCAL MEDICATIONS USED:  MARCAINE    and Amount: 10 ml  SPECIMEN:  culture of 1st MPJ joint, sesamoid path  DISPOSITION OF SPECIMEN: path/micro  Labo:  YES  TOURNIQUET:   Total Tourniquet Time Documented: Calf (Right) - 26 minutes Total: Calf (Right) - 26 minutes   DICTATION: .Note written in EPIC  PLAN OF CARE: Admit to inpatient   PATIENT DISPOSITION:  PACU - hemodynamically stable.   Delay start of Pharmacological VTE agent (>24hrs) due to surgical blood loss or risk of bleeding: no   -NWB RLE -Will need PICC line. Expect 4 weeks at least for septic arthritis, likely 6 for osteo -First dressing change 1/27. Will need home nursing care

## 2022-08-16 NOTE — H&P (Signed)
History and Physical Interval Note:  08/16/2022 3:58 PM  Hector Vazquez  has presented today for surgery, with the diagnosis of osteomyelitis.  The various methods of treatment have been discussed with the patient and family. After consideration of risks, benefits and other options for treatment, the patient has consented to   Procedure(s) with comments: IRRIGATION AND DEBRIDEMENT WOUND (Right) - Right foot wound debridement x 2, seamoid excision, bone biopsy, possible graft Surgeon available at 1530. as a surgical intervention.  The patient's history has been reviewed, patient examined, no change in status, stable for surgery.  I have reviewed the patient's chart and labs.  Questions were answered to the patient's satisfaction.     [name]

## 2022-08-16 NOTE — Progress Notes (Signed)
Upon assessment, patient states he keeps and earplug in his left ear at all times. He states he keeps it in there because if any sort of "wind" gets in his ear it causes a "terrible headache and dizziness that will not go away".   Dewayne Hatch, RN

## 2022-08-16 NOTE — Anesthesia Preprocedure Evaluation (Addendum)
Anesthesia Evaluation  Patient identified by MRN, date of birth, ID band Patient awake    Reviewed: Allergy & Precautions, H&P , NPO status , Patient's Chart, lab work & pertinent test results  Airway Mallampati: II  TM Distance: >3 FB Neck ROM: Full    Dental no notable dental hx.    Pulmonary neg pulmonary ROS   Pulmonary exam normal breath sounds clear to auscultation       Cardiovascular hypertension, Pt. on medications and Pt. on home beta blockers +CHF  Normal cardiovascular exam+ dysrhythmias Atrial Fibrillation  Rhythm:Regular Rate:Normal  IMPRESSIONS     1. EF challenging in the setting of atrial fibrillation. SVi is low 21  cc/m2. Left ventricular ejection fraction, by estimation, is 40 to 45%.  The left ventricle has mildly decreased function. The left ventricle  demonstrates global hypokinesis. Left  ventricular diastolic parameters are indeterminate.   2. Right ventricular systolic function is normal. The right ventricular  size is mildly enlarged. There is normal pulmonary artery systolic  pressure.   3. Left atrial size was moderately dilated.   4. The mitral valve was not well visualized. Mild mitral valve  regurgitation.   5. The aortic valve is tricuspid. Aortic valve regurgitation is mild.   6. Aneurysm of the aortic root, measuring 40 mm.   7. The inferior vena cava is normal in size with greater than 50%  respiratory variability, suggesting right atrial pressure of 3 mmHg.      Neuro/Psych negative neurological ROS  negative psych ROS   GI/Hepatic negative GI ROS, Neg liver ROS,,,Crohn's Dz   Endo/Other  negative endocrine ROS    Renal/GU Renal InsufficiencyRenal disease  negative genitourinary   Musculoskeletal negative musculoskeletal ROS (+)    Abdominal   Peds negative pediatric ROS (+)  Hematology negative hematology ROS (+)   Anesthesia Other Findings    Reproductive/Obstetrics negative OB ROS                             Anesthesia Physical Anesthesia Plan  ASA: 3  Anesthesia Plan: MAC   Post-op Pain Management: Tylenol PO (pre-op)* and Regional block*   Induction: Intravenous  PONV Risk Score and Plan: 1  Airway Management Planned: Simple Face Mask  Additional Equipment:   Intra-op Plan:   Post-operative Plan:   Informed Consent:   Plan Discussed with: CRNA and Surgeon  Anesthesia Plan Comments:        Anesthesia Quick Evaluation

## 2022-08-16 NOTE — Progress Notes (Signed)
Pharmacy Antibiotic Note  Hector Vazquez is a 89 y.o. male admitted on 08/13/2022 with  osteomyelitis . Wound culture from [place] with pseudomonas - pan-sensitive. Antibiotics initially held pending procedure. I&D performed 1/25. Pharmacy has been consulted for cefepime dosing.  Plan: -Cefepime 2 g IV q12h  Height: 6\' 3"  (190.5 cm) Weight: 83.2 kg (183 lb 6.8 oz) IBW/kg (Calculated) : 84.5  Temp (24hrs), Avg:97.7 F (36.5 C), Min:97.4 F (36.3 C), Max:98.1 F (36.7 C)  Recent Labs  Lab 08/13/22 2008 08/14/22 0417 08/16/22 0348  WBC 9.3 7.7 7.4  CREATININE 2.56* 2.13* 1.32*    Estimated Creatinine Clearance: 43.8 mL/min (A) (by C-G formula based on SCr of 1.32 mg/dL (H)).    No Known Allergies  Antimicrobials this admission: Cefepime 1/23 x 1, 1/25 >>   Microbiology results: 1/23 Wound: ng 1/11 Wound (from [place]): pseudomonas aeruginosa (pan-sensitive)  Thank you for allowing pharmacy to be a part of this patient's care.  Tawnya Crook, PharmD, BCPS Clinical Pharmacist 08/16/2022 4:37 PM

## 2022-08-16 NOTE — Progress Notes (Signed)
[facility]                                                                              Hector Vazquez, is a 89 y.o. male, DOB - Nov 27, 1929, [id number] Admit date - 08/13/2022    Outpatient Primary MD for the patient is Garwin Brothers, MD  LOS - 1  days  Chief Complaint  Patient presents with   Osteomyelitis        Brief summary   Patient is a 89 year old male with paroxysmal Afib not on anticoagulation due to fall risk, HTN, CKD stage IIIa, chronic osteomyelitis of the right calcaneus and recent osteomyelitis of first MTP head.  Patient has been followed at wound care clinic for some time and has been changing his dressings twice a day on his feet, lives at home by himself with no significant social support system at home. His last office visit wound care clinic [facility]) was on 1/18.  During that visit, his previous wound cultures grew Pseudomonas aeruginosa, sensitive to Cipro.  Xrays showed osteomyelitis of the calcaneus.  He was placed empirically on Doxy the week before.  Wound care MD documented that the patient has exposed bone of the right first metatarsal head, recommended MRI and nonweightbearing on the right leg.  Assessment & Plan    Principal Problem:   Subacute osteomyelitis of right foot (HCC)/septic arthritis -Wound cultures from the wound clinic grew pansensitive Pseudomonas and had recommended MRI of the right foot -MRI showed septic arthritis of the first MTP joint with osteomyelitis of the medial sesamoid and early osteomyelitis of the periarticular first metatarsal head and proximal phalanx.  Adjacent soft tissue cellulitis and plantar ulcer. -Podiatry consulted, n.p.o., plan for debridement today  -Seen by cardiology, stable, euvolemic from cardiology standpoint low risk -Antibiotics per ID, following.  Active Problems:   AKI (acute kidney injury) ([place]) on CKD stage IIIa -Baseline creatinine 1.6 on 07/01/2022, admitted with creatinine of  2.5 -Likely worsened due to #1, continue gentle hydration -Creatinine improving, received IV albumin    Essential hypertension -Continue Toprol-XL 12.5 mg daily  Chronic combined systolic and diastolic CHF  ([place]) -2D echo 05/2022 had shown EF of 40 to 45%, global hypokinesis, indeterminate diastolic parameters -Currently euvolemic    PAF (paroxysmal atrial fibrillation) (HCC) -Heart rate controlled, continue beta-blocker -Not on anticoagulation due to risk of falls, patient to discuss with his primary cardiologist outpatient  Hypokalemia -Resolved  Pressure injury documentation -Right heel unstageable, POA   Code Status: Full CODE STATUS DVT Prophylaxis:  heparin injection 5,000 Units Start: 08/14/22 0600 SCDs Start: 08/14/22 0027   Level of Care: Level of care: Med-Surg Family Communication: Updated patient Disposition Plan:      Remains inpatient appropriate:   Procedures:  MRI right ankle/foot  Consultants:   ID Podiatry  Cardiology  Antimicrobials:   Anti-infectives (From admission, onward)    Start     Dose/Rate Route Frequency Ordered Stop   08/14/22 1700  ceFEPIme (MAXIPIME) 2 g in sodium chloride 0.9 % 100 mL IVPB  Status:  Discontinued        2 g 200 mL/hr over 30  Minutes Intravenous Every 24 hours 08/14/22 1641 08/15/22 1147   08/13/22 2230  ciprofloxacin (CIPRO) tablet 500 mg  Status:  Discontinued        500 mg Oral Daily 08/13/22 2209 08/14/22 1619          Medications  aspirin EC  81 mg Oral Daily   heparin  5,000 Units Subcutaneous Q8H   metoprolol succinate  12.5 mg Oral Daily      Subjective:   Hector Vazquez was seen and examined today.   States right ankle feels "sore" otherwise no acute complaints.  No chest pain, shortness of breath, nausea or vomiting.  No acute issues overnight.  Objective:   Vitals:   08/15/22 1221 08/15/22 1932 08/16/22 0423 08/16/22 0500  BP: 98/61 103/72 109/71   Pulse: 69 97 70   Resp: 16 16 16    Temp:  (!) 97.5 F (36.4 C) 97.6 F (36.4 C) 98.1 F (36.7 C)   TempSrc: Oral Oral Oral   SpO2: 98% 98% 98%   Weight:    83.2 kg  Height:        Intake/Output Summary (Last 24 hours) at 08/16/2022 1332 Last data filed at 08/16/2022 1000 Gross per 24 hour  Intake --  Output 1300 ml  Net -1300 ml     Wt Readings from Last 3 Encounters:  08/16/22 83.2 kg  07/01/22 81.4 kg  12/19/21 97 kg    Physical Exam General: Alert and oriented x 3, NAD, hard of hearing Cardiovascular: S1 S2 clear, irregularly irregular Respiratory: CTAB, no wheezing Gastrointestinal: Soft, nontender, nondistended, NBS Ext: no LE edema bilaterally Neuro: no new deficits Skin: Right foot dressing intact at Psych: Normal affect     Data Reviewed:  I have personally reviewed following labs    CBC Lab Results  Component Value Date   WBC 7.4 08/16/2022   RBC 3.43 (L) 08/16/2022   HGB 11.4 (L) 08/16/2022   HCT 35.5 (L) 08/16/2022   MCV 103.5 (H) 08/16/2022   MCH 33.2 08/16/2022   PLT 240 08/16/2022   MCHC 32.1 08/16/2022   RDW 14.4 08/16/2022   LYMPHSABS 1.7 08/16/2022   MONOABS 0.8 08/16/2022   EOSABS 0.2 08/16/2022   BASOSABS 0.1 93/26/7124     Last metabolic panel Lab Results  Component Value Date   NA 140 08/16/2022   K 3.8 08/16/2022   CL 108 08/16/2022   CO2 24 08/16/2022   BUN 30 (H) 08/16/2022   CREATININE 1.32 (H) 08/16/2022   GLUCOSE 86 08/16/2022   GFRNONAA 51 (L) 08/16/2022   CALCIUM 8.3 (L) 08/16/2022   PHOS 2.8 08/16/2022   PROT 5.2 (L) 08/16/2022   ALBUMIN 2.8 (L) 08/16/2022   BILITOT 0.7 08/16/2022   ALKPHOS 28 (L) 08/16/2022   AST 19 08/16/2022   ALT 16 08/16/2022   ANIONGAP 8 08/16/2022    CBG (last 3)  No results for input(s): "GLUCAP" in the last 72 hours.    Coagulation Profile: Recent Labs  Lab 08/13/22 2008  INR 1.2     Radiology Studies: I have personally reviewed the imaging studies  No results found.     Estill Cotta M.D. [facility] 08/16/2022, 1:32 PM  Available via Epic secure chat 7am-7pm After 7 pm, please refer to night coverage provider listed on amion.

## 2022-08-16 NOTE — TOC Initial Note (Addendum)
Transition of Care [facility]) - Initial/Assessment Note    Patient Details  Name: Hector Vazquez MRN: [id number] Date of Birth: 18-Jan-1930  Transition [facility] Care [facility]) CM/SW Contact:    Vassie Moselle, LCSW Phone Number: 08/16/2022, 3:55 PM  Clinical Narrative:                 Met with pt to discuss recommendation for SNF placement.  Pt shares he was just at a SNF and has been in and out of SNF's several times. Per [facility] pt has 9 more days of SNF placement before he will be in co-pay days.  He reports he was getting home health services once he discharged from SNF at [facility] but, is unsure of the agency providing services.  Pt is frustrated he is in the hospital and feels that he was forced against his will to come to the hospital. Pt feels that everyone has been lying to him and making promises that cannot be kept.  Pt is apprehensive about SNF placement but, is agreeable to referrals being sent out.  Pt has been referred for SNF and currently awaiting bed offers.    Expected Discharge Plan: Skilled Nursing Facility Barriers to Discharge: SNF Pending bed offer, Continued Medical Work up   Patient Goals and CMS Choice Patient states their goals for this hospitalization and ongoing recovery are:: To go home CMS Medicare.gov Compare Post Acute Care list provided to:: Patient Choice offered to / list presented to : Patient [place] ownership interest in [facility].provided to:: Patient    Expected Discharge Plan and Services In-house Referral: Clinical Social Work Discharge Planning Services: CM Consult Post Acute Care Choice: [place] Living arrangements for the past 2 months: Single Family Home                 DME Arranged: N/A DME Agency: NA                  Prior Living Arrangements/Services Living arrangements for the past 2 months: Single Family Home Lives with:: Self Patient language and need for interpreter reviewed:: Yes Do you feel  safe going back to the place where you live?: Yes      Need for Family Participation in Patient Care: No (Comment) Care giver support system in place?: No (comment) Current home services: DME, Home RN Criminal Activity/Legal Involvement Pertinent to Current Situation/Hospitalization: No - Comment as needed  Activities of Daily Living Home Assistive Devices/Equipment: Cane (specify quad or straight), Walker (specify type) ADL Screening (condition at time of admission) Patient's cognitive ability adequate to safely complete daily activities?: Yes Is the patient deaf or have difficulty hearing?: Yes Does the patient have difficulty seeing, even when wearing glasses/contacts?: No Does the patient have difficulty concentrating, remembering, or making decisions?: No Patient able to express need for assistance with ADLs?: Yes Does the patient have difficulty dressing or bathing?: No Independently performs ADLs?: Yes (appropriate for developmental age) Does the patient have difficulty walking or climbing stairs?: Yes Weakness of Legs: Both Weakness of Arms/Hands: None  Permission Sought/Granted Permission sought to share information with : Chartered certified accountant granted to share information with : Yes, Verbal Permission Granted     Permission granted to share info w AGENCY: SNF's        Emotional Assessment Appearance:: Appears stated age Attitude/Demeanor/Rapport: Complaining Affect (typically observed): Apprehensive, Frustrated Orientation: : Oriented to Self, Oriented to Place, Oriented to  Time, Oriented to Situation Alcohol / Substance  Use: Not Applicable Psych Involvement: No (comment)  Admission diagnosis:  AKI (acute kidney injury) ([place]) [N17.9] Right foot infection [L08.9] Subacute osteomyelitis of right foot ([place]) [M86.271] Osteomyelitis ([place]) [M86.9] Patient Active Problem List   Diagnosis Date Noted   Osteomyelitis ([place]) 08/15/2022   Subacute  osteomyelitis of right foot ([place]) 08/13/2022   AKI (acute kidney injury) ([place]) 08/13/2022   PAF (paroxysmal atrial fibrillation) ([place]) 08/13/2022   Impaired ambulation 06/20/2022   Heart failure with reduced ejection fraction and diastolic dysfunction ([place]) 02/15/2020   Essential hypertension 07/08/2015   Stage 3a chronic kidney disease (CKD) ([place]) - baseline SCr 1.6 07/06/2014   BPH (benign prostatic hyperplasia) 04/05/2014   Crohn's disease ([place]) 02/23/2014   Unspecified hearing loss, unspecified ear 02/23/2014   PCP:  Garwin Brothers, MD Pharmacy:   [id number] [place], [place] [address] Phone: [phone] Fax: [phone]     Social Determinants of Health (SDOH) Social History: SDOH Screenings   Food Insecurity: No Food Insecurity (08/14/2022)  Housing: Low Risk  (08/14/2022)  Transportation Needs: No Transportation Needs (08/14/2022)  Utilities: Not At Risk (08/14/2022)  Tobacco Use: Low Risk  (08/16/2022)   SDOH Interventions:     Readmission Risk Interventions    08/16/2022    3:47 PM  Readmission Risk Prevention Plan  Transportation Screening Complete  PCP or Specialist Appt within 5-7 Days Complete  Home Care Screening Complete  Medication Review (RN CM) Complete

## 2022-08-16 NOTE — NC FL2 (Signed)
[place] MEDICAID FL2 LEVEL OF CARE FORM     IDENTIFICATION  Patient Name: Hector Vazquez Birthdate: Jun 24, 1930 Sex: male Admission Date (Current Location): 08/13/2022  [facility] and [place] Number:  Herbalist and Address:  [facility],  [place] [address] [place]      Provider Number: [id number]  Attending Physician Name and Address:  Mendel Corning, MD  Relative Name and Phone Number:       Current Level of Care: Hospital Recommended Level of Care: [place] Prior Approval Number:    Date Approved/Denied:   PASRR Number: [id number]  Discharge Plan: SNF    Current Diagnoses: Patient Active Problem List   Diagnosis Date Noted   Osteomyelitis ([place]) 08/15/2022   Subacute osteomyelitis of right foot ([place]) 08/13/2022   AKI (acute kidney injury) ([place]) 08/13/2022   PAF (paroxysmal atrial fibrillation) ([place]) 08/13/2022   Impaired ambulation 06/20/2022   Heart failure with reduced ejection fraction and diastolic dysfunction ([place]) 02/15/2020   Essential hypertension 07/08/2015   Stage 3a chronic kidney disease (CKD) ([place]) - baseline SCr 1.6 07/06/2014   BPH (benign prostatic hyperplasia) 04/05/2014   Crohn's disease ([place]) 02/23/2014   Unspecified hearing loss, unspecified ear 02/23/2014    Orientation RESPIRATION BLADDER Height & Weight     Self, Time, Situation, Place  Normal Continent Weight: 183 lb 6.8 oz (83.2 kg) Height:  6\' 3"  (190.5 cm)  BEHAVIORAL SYMPTOMS/MOOD NEUROLOGICAL BOWEL NUTRITION STATUS      Continent Diet (Regular)  AMBULATORY STATUS COMMUNICATION OF NEEDS Skin   Limited Assist Verbally Other (Comment) (Right Heel Wound. Unstageable)                       Personal Care Assistance Level of Assistance  Bathing, Feeding, Dressing Bathing Assistance: Limited assistance Feeding assistance: Independent Dressing Assistance: Limited assistance     Functional Limitations Info  Sight, Hearing, Speech  Sight Info: Adequate Hearing Info: Impaired Speech Info: Adequate    SPECIAL CARE FACTORS FREQUENCY  PT (By licensed PT), OT (By licensed OT)     PT Frequency: 5x/wk OT Frequency: 5x/wk            Contractures Contractures Info: Not present    Additional Factors Info  Code Status, Allergies Code Status Info: FULL Allergies Info: No Known Allergies           Current Medications (08/16/2022):  This is the current hospital active medication list Current Facility-Administered Medications  Medication Dose Route Frequency Provider Last Rate Last Admin   [MAR Hold] acetaminophen (TYLENOL) tablet 650 mg  650 mg Oral Q6H PRN Kristopher Oppenheim, DO       Or   [MAR Hold] acetaminophen (TYLENOL) suppository 650 mg  650 mg Rectal Q6H PRN Kristopher Oppenheim, DO       [facility] Hold] aspirin EC tablet 81 mg  81 mg Oral Daily Kristopher Oppenheim, DO   81 mg at 08/16/22 0851   [facility] Hold] heparin injection 5,000 Units  5,000 Units Subcutaneous Q8H Kristopher Oppenheim, DO   5,000 Units at 08/16/22 4081   lactated ringers infusion   Intravenous Continuous Myrtie Soman, MD 10 mL/hr at 08/16/22 1543 New Bag at 08/16/22 1543   [MAR Hold] metoprolol succinate (TOPROL-XL) 24 hr tablet 12.5 mg  12.5 mg Oral Daily Allie Bossier, MD   12.5 mg at 08/16/22 0851   [MAR Hold] ondansetron (ZOFRAN) tablet 4 mg  4 mg Oral Q6H PRN Kristopher Oppenheim, DO  Or   [MAR Hold] ondansetron (ZOFRAN) injection 4 mg  4 mg Intravenous Q6H PRN Kristopher Oppenheim, DO         Discharge Medications: Please see discharge summary for a list of discharge medications.  Relevant Imaging Results:  Relevant Lab Results:   Additional Information SSN: [id number]  Vassie Moselle, LCSW

## 2022-08-17 ENCOUNTER — Encounter (HOSPITAL_COMMUNITY): Payer: Self-pay | Admitting: Podiatry

## 2022-08-17 DIAGNOSIS — L97412 Non-pressure chronic ulcer of right heel and midfoot with fat layer exposed: Secondary | ICD-10-CM

## 2022-08-17 DIAGNOSIS — L089 Local infection of the skin and subcutaneous tissue, unspecified: Secondary | ICD-10-CM | POA: Diagnosis not present

## 2022-08-17 DIAGNOSIS — N179 Acute kidney failure, unspecified: Secondary | ICD-10-CM | POA: Diagnosis not present

## 2022-08-17 DIAGNOSIS — M86271 Subacute osteomyelitis, right ankle and foot: Secondary | ICD-10-CM | POA: Diagnosis not present

## 2022-08-17 DIAGNOSIS — I10 Essential (primary) hypertension: Secondary | ICD-10-CM | POA: Diagnosis not present

## 2022-08-17 LAB — CBC WITH DIFFERENTIAL/PLATELET
Abs Immature Granulocytes: 0.03 10*3/uL (ref 0.00–0.07)
Basophils Absolute: 0.1 10*3/uL (ref 0.0–0.1)
Basophils Relative: 1 %
Eosinophils Absolute: 0.2 10*3/uL (ref 0.0–0.5)
Eosinophils Relative: 2 %
HCT: 38.5 % — ABNORMAL LOW (ref 39.0–52.0)
Hemoglobin: 12.5 g/dL — ABNORMAL LOW (ref 13.0–17.0)
Immature Granulocytes: 0 %
Lymphocytes Relative: 13 %
Lymphs Abs: 1.1 10*3/uL (ref 0.7–4.0)
MCH: 33.8 pg (ref 26.0–34.0)
MCHC: 32.5 g/dL (ref 30.0–36.0)
MCV: 104.1 fL — ABNORMAL HIGH (ref 80.0–100.0)
Monocytes Absolute: 0.9 10*3/uL (ref 0.1–1.0)
Monocytes Relative: 10 %
Neutro Abs: 6.4 10*3/uL (ref 1.7–7.7)
Neutrophils Relative %: 74 %
Platelets: 248 10*3/uL (ref 150–400)
RBC: 3.7 MIL/uL — ABNORMAL LOW (ref 4.22–5.81)
RDW: 14.1 % (ref 11.5–15.5)
WBC: 8.6 10*3/uL (ref 4.0–10.5)
nRBC: 0 % (ref 0.0–0.2)

## 2022-08-17 LAB — COMPREHENSIVE METABOLIC PANEL
ALT: 15 U/L (ref 0–44)
AST: 18 U/L (ref 15–41)
Albumin: 2.8 g/dL — ABNORMAL LOW (ref 3.5–5.0)
Alkaline Phosphatase: 28 U/L — ABNORMAL LOW (ref 38–126)
Anion gap: 8 (ref 5–15)
BUN: 24 mg/dL — ABNORMAL HIGH (ref 8–23)
CO2: 24 mmol/L (ref 22–32)
Calcium: 8.3 mg/dL — ABNORMAL LOW (ref 8.9–10.3)
Chloride: 106 mmol/L (ref 98–111)
Creatinine, Ser: 1.24 mg/dL (ref 0.61–1.24)
GFR, Estimated: 55 mL/min — ABNORMAL LOW (ref >60)
Glucose, Bld: 82 mg/dL (ref 70–99)
Potassium: 4.2 mmol/L (ref 3.5–5.1)
Sodium: 138 mmol/L (ref 135–145)
Total Bilirubin: 1.1 mg/dL (ref 0.3–1.2)
Total Protein: 5.3 g/dL — ABNORMAL LOW (ref 6.5–8.1)

## 2022-08-17 LAB — MAGNESIUM: Magnesium: 2.1 mg/dL (ref 1.7–2.4)

## 2022-08-17 LAB — AEROBIC CULTURE W GRAM STAIN (SUPERFICIAL SPECIMEN)
Culture: NO GROWTH
Gram Stain: NONE SEEN

## 2022-08-17 LAB — PHOSPHORUS: Phosphorus: 2.6 mg/dL (ref 2.5–4.6)

## 2022-08-17 MED ORDER — CIPROFLOXACIN HCL 500 MG PO TABS
500.0000 mg | ORAL_TABLET | Freq: Two times a day (BID) | ORAL | Status: DC
  Administered 2022-08-17 – 2022-08-23 (×12): 500 mg via ORAL
  Filled 2022-08-17 (×14): qty 1

## 2022-08-17 NOTE — TOC Progression Note (Signed)
Transition of Care [facility]) - Progression Note    Patient Details  Name: Hector Vazquez MRN: [id number] Date of Birth: 04/12/1930  Transition of Care [facility]) CM/SW Contact  Servando Snare, LCSW Phone Number: 08/17/2022, 12:59 PM  Clinical Narrative:   Patient reports he was home with home health and doing well. Patient currently refusing SNF and wants to return home with home health. He reports having some one come in 2x a day to change his dressings and PT 2x a week. He cannot recall his [facility] agency. Patient denies having any family to call. Reports having a brother that is 66 and his spouse passed 2 years ago.     Expected Discharge Plan: [place] Barriers to Discharge: SNF Pending bed offer, Continued Medical Work up  Expected Discharge Plan and Services In-house Referral: Clinical Social Work Discharge Planning Services: CM Consult Post Acute Care Choice: [place] Living arrangements for the past 2 months: Single Family Home                 DME Arranged: N/A DME Agency: NA                   Social Determinants of Health (SDOH) Interventions SDOH Screenings   Food Insecurity: No Food Insecurity (08/14/2022)  Housing: Low Risk  (08/14/2022)  Transportation Needs: No Transportation Needs (08/14/2022)  Utilities: Not At Risk (08/14/2022)  Tobacco Use: Low Risk  (08/16/2022)    Readmission Risk Interventions    08/16/2022    3:47 PM  Readmission Risk Prevention Plan  Transportation Screening Complete  PCP or Specialist Appt within 5-7 Days Complete  Home Care Screening Complete  Medication Review (RN CM) Complete

## 2022-08-17 NOTE — TOC Progression Note (Signed)
Transition of Care [facility]) - Progression Note    Patient Details  Name: Hector Vazquez MRN: [id number] Date of Birth: [birth year]  Transition of Care [facility]) CM/SW Contact  Joaquin Courts, RN Phone Number: 08/17/2022, 5:09 PM  Clinical Narrative:     Patient reports now agreeable to go to snf, prefers [facility] or [facility].  Noted [facility] has declined patient and [facility] is still pending decision.  CM reached out [facility] and was unable to reach rep to request review.  Facility search was also expanded to identify other facility options as well.    Expected Discharge Plan: [place] Barriers to Discharge: SNF Pending bed offer, Continued Medical Work up  Expected Discharge Plan and Services In-house Referral: Clinical Social Work Discharge Planning Services: CM Consult Post Acute Care Choice: [place] Living arrangements for the past 2 months: Single Family Home                 DME Arranged: N/A DME Agency: NA                   Social Determinants of Health (SDOH) Interventions SDOH Screenings   Food Insecurity: No Food Insecurity (08/14/2022)  Housing: Low Risk  (08/14/2022)  Transportation Needs: No Transportation Needs (08/14/2022)  Utilities: Not At Risk (08/14/2022)  Tobacco Use: Low Risk  (08/17/2022)    Readmission Risk Interventions    08/16/2022    3:47 PM  Readmission Risk Prevention Plan  Transportation Screening Complete  PCP or Specialist Appt within 5-7 Days Complete  Home Care Screening Complete  Medication Review (RN CM) Complete

## 2022-08-17 NOTE — Progress Notes (Addendum)
[place] for Infectious Disease  Date of Admission:  08/13/2022   Total days of inpatient antibiotics 2  Principal Problem:   Subacute osteomyelitis of right foot (HCC) Active Problems:   Essential hypertension   Stage 3a chronic kidney disease (CKD) (HCC) - baseline SCr 1.6   Unspecified hearing loss, unspecified ear   Heart failure with reduced ejection fraction and diastolic dysfunction (HCC)   AKI (acute kidney injury) (HCC)   PAF (paroxysmal atrial fibrillation) (HCC)   Osteomyelitis (HCC)   Ulcer of right heel and midfoot with fat layer exposed [facility])          Assessment: 89 year old male with PAF not on AC due to fall risk, CKD stage III admitted for right foot wound involving the first metatarsal head, calcaneus.  Patient is on cefepime. #Osteomyelitis of right foot involving 1st MTP and septic arthritis of 1st MTP SP debridement wit podiatry on 1/25 #CKD -Vital stable on admission, no leukocytosis.  MRI of right foot showed septic arthritis of first MTP joint with osteomyelitis, adjacent soft tissue cellulitis plantar heel soft tissue cellulitis with possible tiny sinus tract distended close proximity of central band plantar fascia. -Patient is followed by [facility] wound [facility], Dr. Welton Flakes.  On 08/02/2022 noted that he was not wearing his postop shoe, wearing regular shoe.  Noted that first [place] wound was blistered.  Patient underwent debridement and cultures taken of tendon which grew Pseudomonas.  No visible bone.  But concern for osteomyelitis. Pt then started on cipro, care taker states pt was on doxy prior to this.  -Taken to OR with podiatry for debridement of right foot MTP, sesamoid bone sent to path, and right foot joint fluid send for Cx(noted purulent drainage with arthrotomy of MTPJ), plantar heel ulcer debrided. Recommendations:  -Start ciprofloxacin 750 mg PO bid  Repeat EKG, 08/13/22 qtc 541(471 on 06/28/22) -Will follow OR Cx and change  antibiotics if needed. Anticipate 6 weeks of abx form OR. -ID F/U with myself on 2/7  ID will follow peripherally and change antibiotics if needed. Dr. Linus Salmons is on over the weekend   Microbiology:   Antibiotics: Ciprofloxacin 1/22-23 Cefepime 1/22-   Cultures:   Other 1/23 wound swab NG  SUBJECTIVE: Sitting in bed Interval: Afebrile over night, wbc 8.6k  Review of Systems: Review of Systems  All other systems reviewed and are negative.    Scheduled Meds:  aspirin EC  81 mg Oral Daily   ciprofloxacin  500 mg Oral BID   heparin  5,000 Units Subcutaneous Q8H   metoprolol succinate  12.5 mg Oral Daily   Continuous Infusions: PRN Meds:.acetaminophen **OR** acetaminophen, ondansetron **OR** ondansetron (ZOFRAN) IV No Known Allergies  OBJECTIVE: Vitals:   08/16/22 1956 08/17/22 0355 08/17/22 0500 08/17/22 1332  BP: (!) 91/59 112/72  (!) 96/50  Pulse: 93 97  85  Resp: 17 18  20   Temp: 98 F (36.7 C) 98.5 F (36.9 C)  97.6 F (36.4 C)  TempSrc:  Oral  Oral  SpO2: 100% 97%  99%  Weight:   81.7 kg   Height:       Body mass index is 22.51 kg/m.  Physical Exam Constitutional:      General: He is not in acute distress.    Appearance: He is normal weight. He is not toxic-appearing.  HENT:     Head: Normocephalic and atraumatic.     Right Ear: External ear normal.  Left Ear: External ear normal.     Nose: No congestion or rhinorrhea.     Mouth/Throat:     Mouth: Mucous membranes are moist.     Pharynx: Oropharynx is clear.  Eyes:     Extraocular Movements: Extraocular movements intact.     Conjunctiva/sclera: Conjunctivae normal.     Pupils: Pupils are equal, round, and reactive to light.  Cardiovascular:     Rate and Rhythm: Normal rate and regular rhythm.     Heart sounds: No murmur heard.    No friction rub. No gallop.  Pulmonary:     Effort: Pulmonary effort is normal.     Breath sounds: Normal breath sounds.  Abdominal:     General: Abdomen is  flat. Bowel sounds are normal.     Palpations: Abdomen is soft.  Musculoskeletal:     Cervical back: Normal range of motion and neck supple.     Comments: Right foot bandaged  Skin:    General: Skin is warm and dry.  Neurological:     General: No focal deficit present.     Mental Status: He is oriented to person, place, and time.  Psychiatric:        Mood and Affect: Mood normal.       Lab Results Lab Results  Component Value Date   WBC 8.6 08/17/2022   HGB 12.5 (L) 08/17/2022   HCT 38.5 (L) 08/17/2022   MCV 104.1 (H) 08/17/2022   PLT 248 08/17/2022    Lab Results  Component Value Date   CREATININE 1.24 08/17/2022   BUN 24 (H) 08/17/2022   NA 138 08/17/2022   K 4.2 08/17/2022   CL 106 08/17/2022   CO2 24 08/17/2022    Lab Results  Component Value Date   ALT 15 08/17/2022   AST 18 08/17/2022   ALKPHOS 28 (L) 08/17/2022   BILITOT 1.1 08/17/2022        Laurice Record, MD [place] for Infectious Disease [facility] 08/17/2022, 2:23 PM

## 2022-08-17 NOTE — Op Note (Signed)
Patient Name: Hector Vazquez DOB: 1930/07/10  MRN: [id number]   Date of Service: 08/13/2022 - 08/16/2022  Surgeon: Dr. Lanae Crumbly, DPM Assistants: None Pre-operative Diagnosis:  Osteomyelitis right foot Neuropathic ulceration right heel Post-operative Diagnosis:  Osteomyelitis right foot Neuropathic ulceration right heel Procedures:  1) incision and drainage through bony cortex of right foot  2) preparation of wound bed with excisional debridement for skin substitute application, total area of debridement 4 cm  3) application skin substitute Pathology/Specimens: ID Type Source Tests Collected by Time Destination  1 : RIGHT FOOT SESAMOID Tissue Bone SURGICAL PATHOLOGY [name], DPM 08/16/2022 1648   A : RIGHT FOOT JOINT CULTURE Body Fluid PATH Other AEROBIC/ANAEROBIC CULTURE W GRAM STAIN (SURGICAL/DEEP WOUND) [name], DPM 08/16/2022 1635    Anesthesia: MAC with local Hemostasis:  Total Tourniquet Time Documented: Calf (Right) - 26 minutes Total: Calf (Right) - 26 minutes  Estimated Blood Loss: 10 cc Materials:  Implant Name Type Inv. Item Serial No. Manufacturer Lot No. LRB No. Used Action  GRAFT MYRIAD 3 LAYER 5X5 - [id number] Graft GRAFT MYRIAD 3 LAYER 5X5  [employer] [id number] Right 1 Implanted   Medications: 24 cc 0.5% Marcaine plain Complications: No complication noted  Indications for Procedure:  This is a 89 y.o. male with a history of peripheral neuropathy and ulceration on the right foot plantar to the sesamoid complex as well as the plantar heel.  MRI revealed osteomyelitis of the sesamoid and septic arthritis.  Surgical intervention was recommended   Procedure in Detail: Patient was identified in pre-operative holding area. Formal consent was signed and the right lower extremity was marked. Patient was brought back to the operating room. Anesthesia was induced. The extremity was prepped and draped in the usual sterile fashion. Timeout  was taken to confirm patient name, laterality, and procedure prior to incision.   Attention was then directed to the right foot where a linear semielliptical incision and a 3 1 ratio was centered around the ulceration plantar to the first MTPJ.  This was carried full-thickness deep to subcutaneous tissue and the ulcer and skin wedge was excised.  The flexor tendon sheath was incised and retracted laterally and the medial sesamoid was identified.  It was resected from the surrounding soft tissue attachments and appeared to be soft friable and necrotic.  This was sent as a pathologic specimen.  An arthrotomy of the MTPJ was performed and purulent drainage emanated from this.  I took a culture of this drainage.  2 L of normal sterile saline were then irrigated through the joint and wound.  Once all nonviable tissue had been excised I began closure of this with 2-0 PDS, 2-0 and 3-0 Monocryl, 2-0 and 3-0 nylon and skin staples.  I directed my attention to the plantar heel where a full-thickness ulceration was present measured 2 cm x 2 cm in a circular pattern.  Surrounding hyperkeratosis was noted.  Necrotic tissue was present in the wound bed.  This was excised in a full-thickness excisional manner with a scalpel.  Once all nonviable tissue and eschar had been removed the ulcer was thoroughly irrigated with a liter of normal sterile saline.  A skin substitute 5 cm x 5 cm myriad matrix 3 layer graft was selected cut to fit and inset into the wound bed.  It was secured with staples, surgical lube and Adaptic.  The foot was then dressed with Adaptic dry sterile dressings. Patient tolerated the procedure well.   Disposition: Following  a period of post-operative monitoring, patient will be transferred to the floor.

## 2022-08-17 NOTE — Progress Notes (Signed)
PT Cancellation Note  Patient Details Name: Berlyn Osuna MRN: [id number] DOB: 08-07-1929   Cancelled Treatment:    Reason Eval/Treat Not Completed: Other (comment) (Eating breakfast, said he walked 2 days ago and can do it, unhappy overall and declined therapy today, but did agree to allow me to come back later to try again.)   Lelon Mast 08/17/2022, 8:49 AM

## 2022-08-17 NOTE — Progress Notes (Signed)
[facility]                                                                              Hector Vazquez, is a 89 y.o. male, DOB - May 20, 1930, XLK:[id number] Admit date - 08/13/2022    Outpatient Primary MD for the patient is Hector Northern, MD  LOS - 2  days  Chief Complaint  Patient presents with   Osteomyelitis        Brief summary   Patient is a 89 year old male with paroxysmal Afib not on anticoagulation due to fall risk, HTN, CKD stage IIIa, chronic osteomyelitis of the right calcaneus and recent osteomyelitis of first MTP head.  Patient has been followed at wound care clinic for some time and has been changing his dressings twice a day on his feet, lives at home by himself with no significant social support system at home. His last office visit wound care clinic [facility]) was on 1/18.  During that visit, his previous wound cultures grew Pseudomonas aeruginosa, sensitive to Cipro.  Xrays showed osteomyelitis of the calcaneus.  He was placed empirically on Doxy the week before.  Wound care MD documented that the patient has exposed bone of the right first metatarsal head, recommended MRI and nonweightbearing on the right leg.  Assessment & Plan    Principal Problem:   Subacute osteomyelitis of right foot (HCC)/septic arthritis -Wound cultures from the wound clinic grew pansensitive Pseudomonas and had recommended MRI of the right foot -MRI showed septic arthritis of the first MTP joint with osteomyelitis of the medial sesamoid and early osteomyelitis of the periarticular first metatarsal head and proximal phalanx.  Adjacent soft tissue cellulitis and plantar ulcer. -Podiatry, ID consulted -Underwent I&D through bone cortex of the right foot on 1/25 -Antibiotics per ID, currently on IV cefepime   Active Problems:   AKI (acute kidney injury) (HCC) on CKD stage IIIa -Baseline creatinine 1.6 on 07/01/2022, admitted with creatinine of 2.5 -Likely worsened due to #1,  received IV albumin -Creatinine improving, at baseline    Essential hypertension -Continue Toprol-XL 12.5 mg daily  Chronic combined systolic and diastolic CHF  (HCC) -2D echo 05/2022 had shown EF of 40 to 45%, global hypokinesis, indeterminate diastolic parameters -Currently euvolemic    PAF (paroxysmal atrial fibrillation) (HCC) -Heart rate controlled, continue beta-blocker -Not on anticoagulation due to risk of falls, patient to discuss with his primary cardiologist outpatient  Hypokalemia -Resolved  Pressure injury documentation -Right heel unstageable, POA   Code Status: Full CODE STATUS DVT Prophylaxis:  heparin injection 5,000 Units Start: 08/14/22 0600 SCDs Start: 08/14/22 0027   Level of Care: Level of care: Med-Surg Family Communication: Updated patient Disposition Plan:      Remains inpatient appropriate: Will need SNF, once cleared by podiatry and ID  Procedures:  MRI right ankle/foot     08/16/22            1) incision and drainage through bony cortex of right foot             2) preparation of wound bed with excisional debridement for skin substitute application, total area of debridement 4 cm  3) application skin substitute  Consultants:   ID Podiatry  Cardiology  Antimicrobials:   Anti-infectives (From admission, onward)    Start     Dose/Rate Route Frequency Ordered Stop   08/17/22 2000  ciprofloxacin (CIPRO) tablet 500 mg        500 mg Oral 2 times daily 08/17/22 1120     08/16/22 2200  ceFEPIme (MAXIPIME) 2 g in sodium chloride 0.9 % 100 mL IVPB  Status:  Discontinued        2 g 200 mL/hr over 30 Minutes Intravenous 2 times daily 08/16/22 1638 08/17/22 1120   08/16/22 1615  ceFEPIme (MAXIPIME) 2 g in sodium chloride 0.9 % 100 mL IVPB        2 g 200 mL/hr over 30 Minutes Intravenous  Once 08/16/22 1603 08/16/22 1718   08/16/22 1600  ceFEPIme (MAXIPIME) 1 g in sodium chloride 0.9 % 100 mL IVPB  Status:  Discontinued        1 g 200  mL/hr over 30 Minutes Intravenous  Once 08/16/22 1559 08/16/22 1603   08/14/22 1700  ceFEPIme (MAXIPIME) 2 g in sodium chloride 0.9 % 100 mL IVPB  Status:  Discontinued        2 g 200 mL/hr over 30 Minutes Intravenous Every 24 hours 08/14/22 1641 08/15/22 1147   08/13/22 2230  ciprofloxacin (CIPRO) tablet 500 mg  Status:  Discontinued        500 mg Oral Daily 08/13/22 2209 08/14/22 1619          Medications  aspirin EC  81 mg Oral Daily   ciprofloxacin  500 mg Oral BID   heparin  5,000 Units Subcutaneous Q8H   metoprolol succinate  12.5 mg Oral Daily      Subjective:   Hector Vazquez was seen and examined today.  No acute complaints, feeling better today.  Issues with the breakfast tray this AM otherwise denies any chest pain, shortness of breath, nausea or vomiting.  Slept well.   Objective:   Vitals:   08/16/22 1843 08/16/22 1956 08/17/22 0355 08/17/22 0500  BP: 110/83 (!) 91/59 112/72   Pulse: 89 93 97   Resp: 18 17 18    Temp: 97.8 F (36.6 C) 98 F (36.7 C) 98.5 F (36.9 C)   TempSrc: Oral  Oral   SpO2: 98% 100% 97%   Weight:    81.7 kg  Height:        Intake/Output Summary (Last 24 hours) at 08/17/2022 1323 Last data filed at 08/17/2022 0300 Gross per 24 hour  Intake 1070 ml  Output 780 ml  Net 290 ml     Wt Readings from Last 3 Encounters:  08/17/22 81.7 kg  07/01/22 81.4 kg  12/19/21 97 kg   Physical Exam General: Alert and oriented, NAD, hard of hearing Cardiovascular: S1 S2 clear, RRR.  Respiratory: CTAB, no wheezing, rales or rhonchi Gastrointestinal: Soft, nontender, nondistended, NBS Ext: no pedal edema bilaterally Neuro: no new deficits Skin: Dressing intact, right foot Psych: Normal affect     Data Reviewed:  I have personally reviewed following labs    CBC Lab Results  Component Value Date   WBC 8.6 08/17/2022   RBC 3.70 (L) 08/17/2022   HGB 12.5 (L) 08/17/2022   HCT 38.5 (L) 08/17/2022   MCV 104.1 (H) 08/17/2022   MCH 33.8  08/17/2022   PLT 248 08/17/2022   MCHC 32.5 08/17/2022   RDW 14.1 08/17/2022   LYMPHSABS 1.1 08/17/2022  MONOABS 0.9 08/17/2022   EOSABS 0.2 08/17/2022   BASOSABS 0.1 61/60/7371     Last metabolic panel Lab Results  Component Value Date   NA 138 08/17/2022   K 4.2 08/17/2022   CL 106 08/17/2022   CO2 24 08/17/2022   BUN 24 (H) 08/17/2022   CREATININE 1.24 08/17/2022   GLUCOSE 82 08/17/2022   GFRNONAA 55 (L) 08/17/2022   CALCIUM 8.3 (L) 08/17/2022   PHOS 2.6 08/17/2022   PROT 5.3 (L) 08/17/2022   ALBUMIN 2.8 (L) 08/17/2022   BILITOT 1.1 08/17/2022   ALKPHOS 28 (L) 08/17/2022   AST 18 08/17/2022   ALT 15 08/17/2022   ANIONGAP 8 08/17/2022    CBG (last 3)  No results for input(s): "GLUCAP" in the last 72 hours.    Coagulation Profile: Recent Labs  Lab 08/13/22 2008  INR 1.2     Radiology Studies: I have personally reviewed the imaging studies  No results found.     Estill Cotta M.D. [facility] 08/17/2022, 1:23 PM  Available via Epic secure chat 7am-7pm After 7 pm, please refer to night coverage provider listed on amion.

## 2022-08-17 NOTE — Plan of Care (Signed)
Aox4. No reports of pain and SOB. Right foot dressing dry and intact. VS monitored.   Problem: Education: Goal: Knowledge of General Education information will improve Description: Including pain rating scale, medication(s)/side effects and non-pharmacologic comfort measures Outcome: Progressing   Problem: Health Behavior/Discharge Planning: Goal: Ability to manage health-related needs will improve Outcome: Progressing   Problem: Clinical Measurements: Goal: Ability to maintain clinical measurements within normal limits will improve Outcome: Progressing Goal: Will remain free from infection Outcome: Progressing Goal: Diagnostic test results will improve Outcome: Progressing Goal: Respiratory complications will improve Outcome: Progressing Goal: Cardiovascular complication will be avoided Outcome: Progressing   Problem: Activity: Goal: Risk for activity intolerance will decrease Outcome: Progressing   Problem: Nutrition: Goal: Adequate nutrition will be maintained Outcome: Progressing   Problem: Coping: Goal: Level of anxiety will decrease Outcome: Progressing   Problem: Elimination: Goal: Will not experience complications related to bowel motility Outcome: Progressing Goal: Will not experience complications related to urinary retention Outcome: Progressing   Problem: Pain Managment: Goal: General experience of comfort will improve Outcome: Progressing   Problem: Safety: Goal: Ability to remain free from injury will improve Outcome: Progressing   Problem: Skin Integrity: Goal: Risk for impaired skin integrity will decrease Outcome: Progressing

## 2022-08-17 NOTE — Progress Notes (Signed)
Physical Therapy Treatment Patient Details Name: Hector Vazquez MRN: [id number] DOB: 12/23/1929 Today's Date: 08/17/2022   History of Present Illness Hector Vazquez is a 89 y.o. male admitted with subacute osteomyelitis of R foot. PMH: CHF, CKD stage 3, HTN, afib    PT Comments    Attempted to see pt twice today for therapy. Spoke at length about mobility and NWB status. Pt is currently refusing to d/c to SNF facility. Pt is currently R NWB and do not feel he will be complaint at home. Discussed need for a w/c to minimize standing. Therapist demonstrated transfer technique to pt and when asked to practice he once again declined saying the setup is not the same as home and refused. I have discussed with him my recommendation for SNF secondary to NWB status and he continued to decline. I have decreased his therapy frequency to 2x wk due to d/c recommendation of SNF and refusal to participate, but as staffing allows we can try and see more often due to he will most likely d/c home. Pt will continue to benefit from acute skilled PT to maximize mobility and independence for the next venue of care.  Recommendations for follow up therapy are one component of a multi-disciplinary discharge planning process, led by the attending physician.  Recommendations may be updated based on patient status, additional functional criteria and insurance authorization.  Follow Up Recommendations  Skilled nursing-short term rehab (<3 hours/day) Can patient physically be transported by private vehicle: Yes   Assistance Recommended at Discharge Frequent or constant Supervision/Assistance  Patient can return home with the following A little help with walking and/or transfers;A little help with bathing/dressing/bathroom;Assistance with cooking/housework;Assist for transportation;Help with stairs or ramp for entrance   Equipment Recommendations  Wheelchair (measurements PT)    Recommendations for Other Services        Precautions / Restrictions Precautions Precautions: Fall Required Braces or Orthoses: Other Brace Other Brace: R foot post-op shoe Restrictions Weight Bearing Restrictions: Yes RLE Weight Bearing: Non weight bearing     Mobility  Bed Mobility               General bed mobility comments: pt reported he sat on EOB on his own, but declined my attempts    Transfers                   General transfer comment: pt declined    Ambulation/Gait               General Gait Details: Explained NWB status, therapist demonstrated technique. Pt was resistant to advice and stated he can walk and declined attempts to stand with therapy.   Stairs             Wheelchair Mobility    Modified Rankin (Stroke Patients Only)       Balance                                            Cognition Arousal/Alertness: Awake/alert Behavior During Therapy: WFL for tasks assessed/performed Overall Cognitive Status: No family/caregiver present to determine baseline cognitive functioning                                 General Comments: attempted to see pt twice, he is resistant to advice and is also refusing reccomendations  for SNF placement.        Exercises      General Comments        Pertinent Vitals/Pain Pain Assessment Pain Assessment: Faces Faces Pain Scale: Hurts a little bit Pain Location: R ankle Pain Descriptors / Indicators: Discomfort Pain Intervention(s): Premedicated before session    Home Living                          Prior Function            PT Goals (current goals can now be found in the care plan section) Progress towards PT goals: Not progressing toward goals - comment (refused to participate twice)    Frequency    Min 2X/week      PT Plan Current plan remains appropriate    Co-evaluation              AM-PAC PT "6 Clicks" Mobility   Outcome Measure  Help needed turning  from your back to your side while in a flat bed without using bedrails?: A Little Help needed moving from lying on your back to sitting on the side of a flat bed without using bedrails?: A Little Help needed moving to and from a bed to a chair (including a wheelchair)?: A Little Help needed standing up from a chair using your arms (e.g., wheelchair or bedside chair)?: A Lot Help needed to walk in hospital room?: A Lot Help needed climbing 3-5 steps with a railing? : Total 6 Click Score: 14    End of Session   Activity Tolerance: Other (comment) (declined to participate with mobility) Patient left: in bed;with call bell/phone within reach Nurse Communication: Mobility status PT Visit Diagnosis: Unsteadiness on feet (R26.81);Other abnormalities of gait and mobility (R26.89);Muscle weakness (generalized) (M62.81)     Time: 9470-9628 PT Time Calculation (min) (ACUTE ONLY): 19 min  Charges:  $Self Care/Home Management: 8-22                      Lelon Mast 08/17/2022, 1:34 PM

## 2022-08-18 DIAGNOSIS — M86271 Subacute osteomyelitis, right ankle and foot: Secondary | ICD-10-CM | POA: Diagnosis not present

## 2022-08-18 DIAGNOSIS — N179 Acute kidney failure, unspecified: Secondary | ICD-10-CM | POA: Diagnosis not present

## 2022-08-18 DIAGNOSIS — L089 Local infection of the skin and subcutaneous tissue, unspecified: Secondary | ICD-10-CM | POA: Diagnosis not present

## 2022-08-18 LAB — CBC WITH DIFFERENTIAL/PLATELET
Abs Immature Granulocytes: 0.04 10*3/uL (ref 0.00–0.07)
Basophils Absolute: 0.1 10*3/uL (ref 0.0–0.1)
Basophils Relative: 1 %
Eosinophils Absolute: 0.2 10*3/uL (ref 0.0–0.5)
Eosinophils Relative: 3 %
HCT: 37.2 % — ABNORMAL LOW (ref 39.0–52.0)
Hemoglobin: 12.1 g/dL — ABNORMAL LOW (ref 13.0–17.0)
Immature Granulocytes: 1 %
Lymphocytes Relative: 20 %
Lymphs Abs: 1.6 10*3/uL (ref 0.7–4.0)
MCH: 33.7 pg (ref 26.0–34.0)
MCHC: 32.5 g/dL (ref 30.0–36.0)
MCV: 103.6 fL — ABNORMAL HIGH (ref 80.0–100.0)
Monocytes Absolute: 0.7 10*3/uL (ref 0.1–1.0)
Monocytes Relative: 9 %
Neutro Abs: 5.4 10*3/uL (ref 1.7–7.7)
Neutrophils Relative %: 66 %
Platelets: 262 10*3/uL (ref 150–400)
RBC: 3.59 MIL/uL — ABNORMAL LOW (ref 4.22–5.81)
RDW: 14.1 % (ref 11.5–15.5)
WBC: 8 10*3/uL (ref 4.0–10.5)
nRBC: 0 % (ref 0.0–0.2)

## 2022-08-18 LAB — COMPREHENSIVE METABOLIC PANEL WITH GFR
ALT: 15 U/L (ref 0–44)
AST: 19 U/L (ref 15–41)
Albumin: 2.9 g/dL — ABNORMAL LOW (ref 3.5–5.0)
Alkaline Phosphatase: 33 U/L — ABNORMAL LOW (ref 38–126)
Anion gap: 6 (ref 5–15)
BUN: 26 mg/dL — ABNORMAL HIGH (ref 8–23)
CO2: 24 mmol/L (ref 22–32)
Calcium: 8.1 mg/dL — ABNORMAL LOW (ref 8.9–10.3)
Chloride: 107 mmol/L (ref 98–111)
Creatinine, Ser: 1.31 mg/dL — ABNORMAL HIGH (ref 0.61–1.24)
GFR, Estimated: 52 mL/min — ABNORMAL LOW
Glucose, Bld: 88 mg/dL (ref 70–99)
Potassium: 3.9 mmol/L (ref 3.5–5.1)
Sodium: 137 mmol/L (ref 135–145)
Total Bilirubin: 0.6 mg/dL (ref 0.3–1.2)
Total Protein: 5.9 g/dL — ABNORMAL LOW (ref 6.5–8.1)

## 2022-08-18 LAB — PHOSPHORUS: Phosphorus: 2.7 mg/dL (ref 2.5–4.6)

## 2022-08-18 LAB — MAGNESIUM: Magnesium: 2.3 mg/dL (ref 1.7–2.4)

## 2022-08-18 NOTE — Progress Notes (Signed)
   Subjective: 2 Days Post-Op Procedure(s) (LRB): IRRIGATION AND DEBRIDEMENT WOUND (Right) DOS: 08/16/2022 Patient doing well.  Resting comfortably in bed.  Dressings are intact.  Objective: Vital signs in last 24 hours: Temp:  [97.6 F (36.4 C)-98.2 F (36.8 C)] 98.2 F (36.8 C) (01/27 0444) Pulse Rate:  [80-100] 84 (01/27 1234) Resp:  [17-20] 20 (01/27 1234) BP: (102-106)/(62-75) 104/69 (01/27 1234) SpO2:  [97 %-100 %] 97 % (01/27 1234) Weight:  [83.2 kg] 83.2 kg (01/27 0459)   Recent Labs    08/16/22 0348 08/17/22 0345 08/18/22 0432  HGB 11.4* 12.5* 12.1*   Recent Labs    08/17/22 0345 08/18/22 0432  WBC 8.6 8.0  RBC 3.70* 3.59*  HCT 38.5* 37.2*  PLT 248 262   Recent Labs    08/17/22 0345 08/18/22 0432  NA 138 137  K 4.2 3.9  CL 106 107  CO2 24 24  BUN 24* 26*  CREATININE 1.24 1.31*  GLUCOSE 82 88  CALCIUM 8.3* 8.1*   Physical Exam  Incision site is well coapted and sutures are intact along the plantar first ray.  Nonadherent gauze to the plantar heel secured with skin staples.  Minimal drainage.  Stable.  Assessment/Plan: 2 Days Post-Op Procedure(s) (LRB): IRRIGATION AND DEBRIDEMENT WOUND (Right) DOS: 08/16/2022 -Dressings changed.  Surgical lubricant applied to the plantar heel.  Xeroform to the incision site.  Orders placed for wound care dressing changes every other day while inpatient. -Prior to discharge will need to arrange home health dressing changes, unless the patient is sent to SNF, continue same wound care dressing changes at the facility -Antibiotics as per infectious disease.  Ciprofloxacin 750 mg twice daily, anticipate 6 weeks -From a podiatry standpoint patient okay for discharge -Will sign off   Edrick Kins 08/18/2022, 3:06 PM  Edrick Kins, DPM [facility]  Dr. Edrick Kins, DPM    [address], [place] [address]                Office [phone]  Fax [phone]

## 2022-08-18 NOTE — Progress Notes (Addendum)
[facility]                                                                              Hector Vazquez, is a 89 y.o. male, DOB - 18-Jan-1930, [id number] Admit date - 08/13/2022    Outpatient Primary MD for the patient is Hector Northern, MD  LOS - 3  days  Chief Complaint  Patient presents with   Osteomyelitis        Brief summary   Patient is a 89 year old male with paroxysmal Afib not on anticoagulation due to fall risk, HTN, CKD stage IIIa, chronic osteomyelitis of the right calcaneus and recent osteomyelitis of first MTP head.  Patient has been followed at wound care clinic for some time and has been changing his dressings twice a day on his feet, lives at home by himself with no significant social support system at home. His last office visit wound care clinic [facility]) was on 1/18.  During that visit, his previous wound cultures grew Pseudomonas aeruginosa, sensitive to Cipro.  Xrays showed osteomyelitis of the calcaneus.  He was placed empirically on Doxy the week before.  Wound care MD documented that the patient has exposed bone of the right first metatarsal head, recommended MRI and nonweightbearing on the right leg.  Assessment & Plan    Principal Problem:   Subacute osteomyelitis of right foot (HCC)/septic arthritis -Wound cultures from the wound clinic grew pansensitive Pseudomonas and had recommended MRI of the right foot -MRI showed septic arthritis of the first MTP joint with osteomyelitis of the medial sesamoid and early osteomyelitis of the periarticular first metatarsal head and proximal phalanx.  Adjacent soft tissue cellulitis and plantar ulcer. -Podiatry, ID consulted -Underwent I&D through bone cortex of the right foot on 1/25 -Antibiotics per ID,, recommended ciprofloxacin 750 mg p.o. twice daily, anticipate 6 weeks of antibiotics from the OR.   Active Problems:   AKI (acute kidney injury) (HCC) on CKD stage IIIa -Baseline creatinine 1.6 on  07/01/2022, admitted with creatinine of 2.5 -Likely worsened due to #1, received IV albumin -Creatinine slightly trended up to 1.3 today, follow closely    Essential hypertension -BP stable, continue Toprol-XL 12.5 mg daily   Chronic combined systolic and diastolic CHF  (HCC) -2D echo 05/2022 had shown EF of 40 to 45%, global hypokinesis, indeterminate diastolic parameters -Currently euvolemic    PAF (paroxysmal atrial fibrillation) (HCC) -Heart rate controlled, continue beta-blocker -Not on anticoagulation due to risk of falls, patient to discuss with his primary cardiologist outpatient  Hypokalemia -Resolved  Pressure injury documentation -Right heel unstageable, POA   Code Status: Full CODE STATUS DVT Prophylaxis:  heparin injection 5,000 Units Start: 08/14/22 0600 SCDs Start: 08/14/22 0027   Level of Care: Level of care: Med-Surg Family Communication: Updated patient's friend at the bedside Disposition Plan:      Remains inpatient appropriate: Will need SNF, once cleared by podiatry and ID  Procedures:  MRI right ankle/foot     08/16/22            1) incision and drainage through bony cortex of right foot  2) preparation of wound bed with excisional debridement for skin substitute application, total area of debridement 4 cm             3) application skin substitute  Consultants:   ID Podiatry  Cardiology  Antimicrobials:   Anti-infectives (From admission, onward)    Start     Dose/Rate Route Frequency Ordered Stop   08/17/22 2000  ciprofloxacin (CIPRO) tablet 500 mg        500 mg Oral 2 times daily 08/17/22 1120     08/16/22 2200  ceFEPIme (MAXIPIME) 2 g in sodium chloride 0.9 % 100 mL IVPB  Status:  Discontinued        2 g 200 mL/hr over 30 Minutes Intravenous 2 times daily 08/16/22 1638 08/17/22 1120   08/16/22 1615  ceFEPIme (MAXIPIME) 2 g in sodium chloride 0.9 % 100 mL IVPB        2 g 200 mL/hr over 30 Minutes Intravenous  Once 08/16/22 1603  08/16/22 1718   08/16/22 1600  ceFEPIme (MAXIPIME) 1 g in sodium chloride 0.9 % 100 mL IVPB  Status:  Discontinued        1 g 200 mL/hr over 30 Minutes Intravenous  Once 08/16/22 1559 08/16/22 1603   08/14/22 1700  ceFEPIme (MAXIPIME) 2 g in sodium chloride 0.9 % 100 mL IVPB  Status:  Discontinued        2 g 200 mL/hr over 30 Minutes Intravenous Every 24 hours 08/14/22 1641 08/15/22 1147   08/13/22 2230  ciprofloxacin (CIPRO) tablet 500 mg  Status:  Discontinued        500 mg Oral Daily 08/13/22 2209 08/14/22 1619          Medications  aspirin EC  81 mg Oral Daily   ciprofloxacin  500 mg Oral BID   heparin  5,000 Units Subcutaneous Q8H   metoprolol succinate  12.5 mg Oral Daily      Subjective:   Hector Vazquez was seen and examined today.  Denies any specific complaints, patient's caregiver, friend at the bedside.  Denies any chest pain, shortness of breath, nausea or vomiting.  No fevers.  Objective:   Vitals:   08/17/22 1332 08/17/22 2021 08/18/22 0444 08/18/22 0459  BP: (!) 96/50 102/75 106/62   Pulse: 85 100 80   Resp: 20 20 17    Temp: 97.6 F (36.4 C) 97.6 F (36.4 C) 98.2 F (36.8 C)   TempSrc: Oral Oral    SpO2: 99% 100% 100%   Weight:    83.2 kg  Height:        Intake/Output Summary (Last 24 hours) at 08/18/2022 1214 Last data filed at 08/18/2022 0459 Gross per 24 hour  Intake --  Output 600 ml  Net -600 ml     Wt Readings from Last 3 Encounters:  08/18/22 83.2 kg  07/01/22 81.4 kg  12/19/21 97 kg    Physical Exam General: Alert and oriented x 3, NAD Cardiovascular: S1 S2 clear, RRR.  Respiratory: CTAB, no wheezing Gastrointestinal: Soft, nontender, nondistended, NBS Ext: no pedal edema bilaterally Neuro: no new deficits Skin: Dressing intact right foot Psych: Normal affect     Data Reviewed:  I have personally reviewed following labs    CBC Lab Results  Component Value Date   WBC 8.0 08/18/2022   RBC 3.59 (L) 08/18/2022   HGB 12.1  (L) 08/18/2022   HCT 37.2 (L) 08/18/2022   MCV 103.6 (H) 08/18/2022   MCH 33.7 08/18/2022  PLT 262 08/18/2022   MCHC 32.5 08/18/2022   RDW 14.1 08/18/2022   LYMPHSABS 1.6 08/18/2022   MONOABS 0.7 08/18/2022   EOSABS 0.2 08/18/2022   BASOSABS 0.1 55/73/2202     Last metabolic panel Lab Results  Component Value Date   NA 137 08/18/2022   K 3.9 08/18/2022   CL 107 08/18/2022   CO2 24 08/18/2022   BUN 26 (H) 08/18/2022   CREATININE 1.31 (H) 08/18/2022   GLUCOSE 88 08/18/2022   GFRNONAA 52 (L) 08/18/2022   CALCIUM 8.1 (L) 08/18/2022   PHOS 2.7 08/18/2022   PROT 5.9 (L) 08/18/2022   ALBUMIN 2.9 (L) 08/18/2022   BILITOT 0.6 08/18/2022   ALKPHOS 33 (L) 08/18/2022   AST 19 08/18/2022   ALT 15 08/18/2022   ANIONGAP 6 08/18/2022    CBG (last 3)  No results for input(s): "GLUCAP" in the last 72 hours.    Coagulation Profile: Recent Labs  Lab 08/13/22 2008  INR 1.2     Radiology Studies: I have personally reviewed the imaging studies  No results found.     Estill Cotta M.D. [facility] 08/18/2022, 12:14 PM  Available via Epic secure chat 7am-7pm After 7 pm, please refer to night coverage provider listed on amion.

## 2022-08-19 DIAGNOSIS — M86271 Subacute osteomyelitis, right ankle and foot: Secondary | ICD-10-CM | POA: Diagnosis not present

## 2022-08-19 DIAGNOSIS — N179 Acute kidney failure, unspecified: Secondary | ICD-10-CM | POA: Diagnosis not present

## 2022-08-19 DIAGNOSIS — L089 Local infection of the skin and subcutaneous tissue, unspecified: Secondary | ICD-10-CM | POA: Diagnosis not present

## 2022-08-19 LAB — CBC WITH DIFFERENTIAL/PLATELET
Abs Immature Granulocytes: 0.05 10*3/uL (ref 0.00–0.07)
Basophils Absolute: 0.1 10*3/uL (ref 0.0–0.1)
Basophils Relative: 1 %
Eosinophils Absolute: 0.2 10*3/uL (ref 0.0–0.5)
Eosinophils Relative: 3 %
HCT: 38.8 % — ABNORMAL LOW (ref 39.0–52.0)
Hemoglobin: 12.4 g/dL — ABNORMAL LOW (ref 13.0–17.0)
Immature Granulocytes: 1 %
Lymphocytes Relative: 23 %
Lymphs Abs: 1.8 10*3/uL (ref 0.7–4.0)
MCH: 33.2 pg (ref 26.0–34.0)
MCHC: 32 g/dL (ref 30.0–36.0)
MCV: 103.7 fL — ABNORMAL HIGH (ref 80.0–100.0)
Monocytes Absolute: 0.6 10*3/uL (ref 0.1–1.0)
Monocytes Relative: 7 %
Neutro Abs: 4.9 10*3/uL (ref 1.7–7.7)
Neutrophils Relative %: 65 %
Platelets: 314 10*3/uL (ref 150–400)
RBC: 3.74 MIL/uL — ABNORMAL LOW (ref 4.22–5.81)
RDW: 14.2 % (ref 11.5–15.5)
WBC: 7.6 10*3/uL (ref 4.0–10.5)
nRBC: 0 % (ref 0.0–0.2)

## 2022-08-19 LAB — MAGNESIUM: Magnesium: 2 mg/dL (ref 1.7–2.4)

## 2022-08-19 LAB — PHOSPHORUS: Phosphorus: 2.4 mg/dL — ABNORMAL LOW (ref 2.5–4.6)

## 2022-08-19 LAB — COMPREHENSIVE METABOLIC PANEL
ALT: 16 U/L (ref 0–44)
AST: 24 U/L (ref 15–41)
Albumin: 3 g/dL — ABNORMAL LOW (ref 3.5–5.0)
Alkaline Phosphatase: 31 U/L — ABNORMAL LOW (ref 38–126)
Anion gap: 9 (ref 5–15)
BUN: 28 mg/dL — ABNORMAL HIGH (ref 8–23)
CO2: 25 mmol/L (ref 22–32)
Calcium: 8.3 mg/dL — ABNORMAL LOW (ref 8.9–10.3)
Chloride: 103 mmol/L (ref 98–111)
Creatinine, Ser: 1.45 mg/dL — ABNORMAL HIGH (ref 0.61–1.24)
GFR, Estimated: 46 mL/min — ABNORMAL LOW (ref >60)
Glucose, Bld: 130 mg/dL — ABNORMAL HIGH (ref 70–99)
Potassium: 3.9 mmol/L (ref 3.5–5.1)
Sodium: 137 mmol/L (ref 135–145)
Total Bilirubin: 0.8 mg/dL (ref 0.3–1.2)
Total Protein: 5.6 g/dL — ABNORMAL LOW (ref 6.5–8.1)

## 2022-08-19 MED ORDER — LACTATED RINGERS IV SOLN: INTRAVENOUS | Status: AC

## 2022-08-19 MED ORDER — FENTANYL CITRATE PF 50 MCG/ML IJ SOSY: 12.5000 ug | PREFILLED_SYRINGE | INTRAMUSCULAR | Status: DC | PRN

## 2022-08-19 MED ORDER — POLYETHYLENE GLYCOL 3350 17 G PO PACK
17.0000 g | PACK | Freq: Once | ORAL | Status: DC
  Filled 2022-08-19: qty 1

## 2022-08-19 MED ORDER — TRAMADOL HCL 50 MG PO TABS
50.0000 mg | ORAL_TABLET | Freq: Three times a day (TID) | ORAL | Status: DC | PRN
  Administered 2022-08-19 – 2022-08-22 (×4): 50 mg via ORAL
  Filled 2022-08-19 (×4): qty 1

## 2022-08-19 MED ORDER — DOCUSATE SODIUM 100 MG PO CAPS
100.0000 mg | ORAL_CAPSULE | Freq: Two times a day (BID) | ORAL | Status: DC
  Administered 2022-08-19 – 2022-08-23 (×7): 100 mg via ORAL
  Filled 2022-08-19 (×7): qty 1

## 2022-08-19 MED ORDER — K PHOS MONO-SOD PHOS DI & MONO 155-852-130 MG PO TABS
500.0000 mg | ORAL_TABLET | Freq: Two times a day (BID) | ORAL | Status: DC
  Administered 2022-08-19 – 2022-08-23 (×9): 500 mg via ORAL
  Filled 2022-08-19 (×10): qty 2

## 2022-08-19 NOTE — Plan of Care (Signed)

## 2022-08-19 NOTE — Progress Notes (Signed)
[facility]                                                                              Hector Vazquez, is a 89 y.o. male, DOB - 1929-12-06, KXF:[id number] Admit date - 08/13/2022    Outpatient Primary MD for the patient is Garwin Brothers, MD  LOS - 4  days  Chief Complaint  Patient presents with   Osteomyelitis        Brief summary   Patient is a 89 year old male with paroxysmal Afib not on anticoagulation due to fall risk, HTN, CKD stage IIIa, chronic osteomyelitis of the right calcaneus and recent osteomyelitis of first MTP head.  Patient has been followed at wound care clinic for some time and has been changing his dressings twice a day on his feet, lives at home by himself with no significant social support system at home. His last office visit wound care clinic [facility]) was on 1/18.  During that visit, his previous wound cultures grew Pseudomonas aeruginosa, sensitive to Cipro.  Xrays showed osteomyelitis of the calcaneus.  He was placed empirically on Doxy the week before.  Wound care MD documented that the patient has exposed bone of the right first metatarsal head, recommended MRI and nonweightbearing on the right leg.  Assessment & Plan    Principal Problem:   Subacute osteomyelitis of right foot (HCC)/septic arthritis -Wound cultures from the wound clinic grew pansensitive Pseudomonas and had recommended MRI of the right foot -MRI showed septic arthritis of the first MTP joint with osteomyelitis of the medial sesamoid and early osteomyelitis of the periarticular first metatarsal head and proximal phalanx.  Adjacent soft tissue cellulitis and plantar ulcer. -Podiatry, ID consulted -Underwent I&D through bone cortex of the right foot on 1/25 -Appreciate ID recommendations, no new positive cultures.  Recommended to continue ciprofloxacin 750 mg twice daily for 6 weeks from the time of surgery, stop date on 09/26/2022. -States pain not well-controlled, added  tramadol and IV fentanyl as needed for pain control.  Added bowel regimen   Active Problems:   AKI (acute kidney injury) ([name]) on CKD stage IIIa -Baseline creatinine 1.6 on 07/01/2022, admitted with creatinine of 2.5 -Likely worsened due to #1, received IV albumin -Creatinine slightly trending up, 1.45, will place on gentle hydration    Essential hypertension -BP stable, continue Toprol-XL 12.5 mg daily   Chronic combined systolic and diastolic CHF  ([name]) -2D echo 05/2022 had shown EF of 40 to 45%, global hypokinesis, indeterminate diastolic parameters -Currently euvolemic    PAF (paroxysmal atrial fibrillation) (HCC) -HR controlled, continue Toprol-XL -Not on anticoagulation due to risk of falls, patient to discuss with his primary cardiologist outpatient  Hypokalemia -Resolved  Hypophosphatemia -Placed on phosphorus replacement  Pressure injury documentation -Right heel unstageable, POA  Constipation -Feels more constipated, placed on MiraLAX x1, Colace 100 mg twice daily   Code Status: Full CODE STATUS DVT Prophylaxis:  heparin injection 5,000 Units Start: 08/14/22 0600 SCDs Start: 08/14/22 0027   Level of Care: Level of care: Med-Surg Family Communication: Updated patient's friend at the bedside on 1/27 Disposition Plan:      Remains  inpatient appropriate: SNF pending, medically stable for discharge   Procedures:  MRI right ankle/foot     08/16/22            1) incision and drainage through bony cortex of right foot             2) preparation of wound bed with excisional debridement for skin substitute application, total area of debridement 4 cm             3) application skin substitute  Consultants:   ID Podiatry  Cardiology  Antimicrobials:   Anti-infectives (From admission, onward)    Start     Dose/Rate Route Frequency Ordered Stop   08/17/22 2000  ciprofloxacin (CIPRO) tablet 500 mg        500 mg Oral 2 times daily 08/17/22 1120     08/16/22  2200  ceFEPIme (MAXIPIME) 2 g in sodium chloride 0.9 % 100 mL IVPB  Status:  Discontinued        2 g 200 mL/hr over 30 Minutes Intravenous 2 times daily 08/16/22 1638 08/17/22 1120   08/16/22 1615  ceFEPIme (MAXIPIME) 2 g in sodium chloride 0.9 % 100 mL IVPB        2 g 200 mL/hr over 30 Minutes Intravenous  Once 08/16/22 1603 08/16/22 1718   08/16/22 1600  ceFEPIme (MAXIPIME) 1 g in sodium chloride 0.9 % 100 mL IVPB  Status:  Discontinued        1 g 200 mL/hr over 30 Minutes Intravenous  Once 08/16/22 1559 08/16/22 1603   08/14/22 1700  ceFEPIme (MAXIPIME) 2 g in sodium chloride 0.9 % 100 mL IVPB  Status:  Discontinued        2 g 200 mL/hr over 30 Minutes Intravenous Every 24 hours 08/14/22 1641 08/15/22 1147   08/13/22 2230  ciprofloxacin (CIPRO) tablet 500 mg  Status:  Discontinued        500 mg Oral Daily 08/13/22 2209 08/14/22 1619          Medications  aspirin EC  81 mg Oral Daily   ciprofloxacin  500 mg Oral BID   docusate sodium  100 mg Oral BID   heparin  5,000 Units Subcutaneous Q8H   metoprolol succinate  12.5 mg Oral Daily   polyethylene glycol  17 g Oral Once      Subjective:   Hector Vazquez was seen and examined today.  States pain is not well-controlled and he felt sore in his foot all day yesterday.  Also having constipation.  No nausea vomiting, tolerating diet.   Objective:   Vitals:   08/18/22 1234 08/18/22 2053 08/19/22 0338 08/19/22 1322  BP: 104/69 92/60 111/69 114/73  Pulse: 84 94 73 79  Resp: 20 17  18   Temp:  98 F (36.7 C) 97.6 F (36.4 C) (!) 97.4 F (36.3 C)  TempSrc:  Oral Oral   SpO2: 97% 97% 99% 92%  Weight:      Height:        Intake/Output Summary (Last 24 hours) at 08/19/2022 1328 Last data filed at 08/19/2022 1324 Gross per 24 hour  Intake 210 ml  Output 920 ml  Net -710 ml     Wt Readings from Last 3 Encounters:  08/18/22 83.2 kg  07/01/22 81.4 kg  12/19/21 97 kg   Physical Exam General: Alert and oriented x 3,  NAD Cardiovascular: S1 S2 clear, RRR.  Respiratory: CTAB Gastrointestinal: Soft, nontender, nondistended, NBS Ext: no pedal edema bilaterally  Neuro: no new deficits Skin: Dressing into the right foot. Psych: Normal affect    Data Reviewed:  I have personally reviewed following labs    CBC Lab Results  Component Value Date   WBC 7.6 08/19/2022   RBC 3.74 (L) 08/19/2022   HGB 12.4 (L) 08/19/2022   HCT 38.8 (L) 08/19/2022   MCV 103.7 (H) 08/19/2022   MCH 33.2 08/19/2022   PLT 314 08/19/2022   MCHC 32.0 08/19/2022   RDW 14.2 08/19/2022   LYMPHSABS 1.8 08/19/2022   MONOABS 0.6 08/19/2022   EOSABS 0.2 08/19/2022   BASOSABS 0.1 41/58/3094     Last metabolic panel Lab Results  Component Value Date   NA 137 08/19/2022   K 3.9 08/19/2022   CL 103 08/19/2022   CO2 25 08/19/2022   BUN 28 (H) 08/19/2022   CREATININE 1.45 (H) 08/19/2022   GLUCOSE 130 (H) 08/19/2022   GFRNONAA 46 (L) 08/19/2022   CALCIUM 8.3 (L) 08/19/2022   PHOS 2.4 (L) 08/19/2022   PROT 5.6 (L) 08/19/2022   ALBUMIN 3.0 (L) 08/19/2022   BILITOT 0.8 08/19/2022   ALKPHOS 31 (L) 08/19/2022   AST 24 08/19/2022   ALT 16 08/19/2022   ANIONGAP 9 08/19/2022    CBG (last 3)  No results for input(s): "GLUCAP" in the last 72 hours.    Coagulation Profile: Recent Labs  Lab 08/13/22 2008  INR 1.2     Radiology Studies: I have personally reviewed the imaging studies  No results found.     Estill Cotta M.D. [facility] 08/19/2022, 1:28 PM  Available via Epic secure chat 7am-7pm After 7 pm, please refer to night coverage provider listed on amion.

## 2022-08-19 NOTE — Progress Notes (Signed)
Brief ID note  No new positive cultures.   On appropriate antibiotics Continue with cipro 750 mg twice a day for 6 weeks from time of surgery through March 6  Has follow up arranged.  Will sign off   Thayer Headings, MD

## 2022-08-20 DIAGNOSIS — N179 Acute kidney failure, unspecified: Secondary | ICD-10-CM | POA: Diagnosis not present

## 2022-08-20 DIAGNOSIS — M86271 Subacute osteomyelitis, right ankle and foot: Secondary | ICD-10-CM | POA: Diagnosis not present

## 2022-08-20 DIAGNOSIS — I10 Essential (primary) hypertension: Secondary | ICD-10-CM | POA: Diagnosis not present

## 2022-08-20 DIAGNOSIS — L089 Local infection of the skin and subcutaneous tissue, unspecified: Secondary | ICD-10-CM | POA: Diagnosis not present

## 2022-08-20 LAB — SURGICAL PATHOLOGY

## 2022-08-20 LAB — CBC WITH DIFFERENTIAL/PLATELET
Abs Immature Granulocytes: 0.04 10*3/uL (ref 0.00–0.07)
Basophils Absolute: 0 10*3/uL (ref 0.0–0.1)
Basophils Relative: 1 %
Eosinophils Absolute: 0.2 10*3/uL (ref 0.0–0.5)
Eosinophils Relative: 3 %
HCT: 36.3 % — ABNORMAL LOW (ref 39.0–52.0)
Hemoglobin: 11.8 g/dL — ABNORMAL LOW (ref 13.0–17.0)
Immature Granulocytes: 1 %
Lymphocytes Relative: 21 %
Lymphs Abs: 1.5 10*3/uL (ref 0.7–4.0)
MCH: 33.5 pg (ref 26.0–34.0)
MCHC: 32.5 g/dL (ref 30.0–36.0)
MCV: 103.1 fL — ABNORMAL HIGH (ref 80.0–100.0)
Monocytes Absolute: 0.6 10*3/uL (ref 0.1–1.0)
Monocytes Relative: 9 %
Neutro Abs: 4.7 10*3/uL (ref 1.7–7.7)
Neutrophils Relative %: 65 %
Platelets: 335 10*3/uL (ref 150–400)
RBC: 3.52 MIL/uL — ABNORMAL LOW (ref 4.22–5.81)
RDW: 14.2 % (ref 11.5–15.5)
WBC: 7.1 10*3/uL (ref 4.0–10.5)
nRBC: 0 % (ref 0.0–0.2)

## 2022-08-20 LAB — BASIC METABOLIC PANEL
Anion gap: 9 (ref 5–15)
BUN: 24 mg/dL — ABNORMAL HIGH (ref 8–23)
CO2: 24 mmol/L (ref 22–32)
Calcium: 8.1 mg/dL — ABNORMAL LOW (ref 8.9–10.3)
Chloride: 102 mmol/L (ref 98–111)
Creatinine, Ser: 1.34 mg/dL — ABNORMAL HIGH (ref 0.61–1.24)
GFR, Estimated: 50 mL/min — ABNORMAL LOW (ref >60)
Glucose, Bld: 83 mg/dL (ref 70–99)
Potassium: 4.4 mmol/L (ref 3.5–5.1)
Sodium: 135 mmol/L (ref 135–145)

## 2022-08-20 LAB — MAGNESIUM: Magnesium: 2.1 mg/dL (ref 1.7–2.4)

## 2022-08-20 LAB — PHOSPHORUS: Phosphorus: 3.7 mg/dL (ref 2.5–4.6)

## 2022-08-20 MED ORDER — POLYETHYLENE GLYCOL 3350 17 G PO PACK
17.0000 g | PACK | Freq: Every day | ORAL | Status: DC | PRN
  Administered 2022-08-20 – 2022-08-21 (×2): 17 g via ORAL
  Filled 2022-08-20: qty 1

## 2022-08-20 MED ORDER — TRAMADOL HCL 50 MG PO TABS: 50.0000 mg | ORAL_TABLET | Freq: Three times a day (TID) | ORAL | 0 refills | Status: AC | PRN

## 2022-08-20 NOTE — TOC Progression Note (Signed)
Transition of Care [facility]) - Progression Note    Patient Details  Name: Hector Vazquez MRN: [id number] Date of Birth: Apr 07, 1930  Transition of Care (TOC) CM/SW Contact  Lennart Pall, LCSW Phone Number: 08/20/2022, 12:00 PM  Clinical Narrative:     Have provided pt with SNF bed offers and he asks that he be given time to speak with his support persons to help make choice.  Have explained that we need to get a choice today so insurance authorization can be started.  Pt had a friend in the room who understands and will assist pt with this.  Expected Discharge Plan: [place] Barriers to Discharge: SNF Pending bed offer, Continued Medical Work up  Expected Discharge Plan and Services In-house Referral: Clinical Social Work Discharge Planning Services: CM Consult Post Acute Care Choice: [place] Living arrangements for the past 2 months: Single Family Home                 DME Arranged: N/A DME Agency: NA                   Social Determinants of Health (SDOH) Interventions SDOH Screenings   Food Insecurity: No Food Insecurity (08/14/2022)  Housing: Low Risk  (08/14/2022)  Transportation Needs: No Transportation Needs (08/14/2022)  Utilities: Not At Risk (08/14/2022)  Tobacco Use: Low Risk  (08/17/2022)    Readmission Risk Interventions    08/16/2022    3:47 PM  Readmission Risk Prevention Plan  Transportation Screening Complete  PCP or Specialist Appt within 5-7 Days Complete  Home Care Screening Complete  Medication Review (RN CM) Complete

## 2022-08-20 NOTE — Progress Notes (Signed)
[facility]                                                                              Hector Vazquez, is a 89 y.o. male, DOB - 30-Apr-1930, ZOX:[id number] Admit date - 08/13/2022    Outpatient Primary MD for the patient is [name], MD  LOS - 5  days  Chief Complaint  Patient presents with   Osteomyelitis        Brief summary   Patient is a 89 year old male with paroxysmal Afib not on anticoagulation due to fall risk, HTN, CKD stage IIIa, chronic osteomyelitis of the right calcaneus and recent osteomyelitis of first MTP head.  Patient has been followed at wound care clinic for some time and has been changing his dressings twice a day on his feet, lives at home by himself with no significant social support system at home. His last office visit wound care clinic [facility]) was on 1/18.  During that visit, his previous wound cultures grew Pseudomonas aeruginosa, sensitive to Cipro.  Xrays showed osteomyelitis of the calcaneus.  He was placed empirically on Doxy the week before.  Wound care MD documented that the patient has exposed bone of the right first metatarsal head, recommended MRI and nonweightbearing on the right leg.  Assessment & Plan    Principal Problem:   Subacute osteomyelitis of right foot (HCC)/septic arthritis -Wound cultures from the wound clinic grew pansensitive Pseudomonas and had recommended MRI of the right foot -MRI showed septic arthritis of the first MTP joint with osteomyelitis of the medial sesamoid and early osteomyelitis of the periarticular first metatarsal head and proximal phalanx.  Adjacent soft tissue cellulitis and plantar ulcer. -Podiatry, ID consulted -Underwent I&D through bone cortex of the right foot on 1/25 -Appreciate ID recommendations, no new positive cultures.  Recommended to continue ciprofloxacin 750 mg twice daily for 6 weeks from the time of surgery, stop date on 09/26/2022. -Pain much better controlled today, states  was able to sleep good and would like to have same regimen.   -Still feels somewhat constipated.    Active Problems:   AKI (acute kidney injury) (HCC) on CKD stage IIIa -Baseline creatinine 1.6 on 07/01/2022, admitted with creatinine of 2.5 -Likely worsened due to #1, received IV albumin -Creatinine improving, 1.3    Essential hypertension -BP stable, continue Toprol-XL 12.5 mg daily   Chronic combined systolic and diastolic CHF  (HCC) -2D echo 05/2022 had shown EF of 40 to 45%, global hypokinesis, indeterminate diastolic parameters -Currently euvolemic    PAF (paroxysmal atrial fibrillation) (HCC) -HR controlled, continue Toprol-XL -Not on anticoagulation due to risk of falls, patient to discuss with his primary cardiologist outpatient  Hypokalemia -Resolved  Hypophosphatemia -Placed on phosphorus replacement  Pressure injury documentation -Right heel unstageable, POA  Constipation -Continue MiraLAX 17 g p.o. daily as needed, Colace 100 mg twice daily   Code Status: Full CODE STATUS DVT Prophylaxis:  heparin injection 5,000 Units Start: 08/14/22 0600 SCDs Start: 08/14/22 0027   Level of Care: Level of care: Med-Surg Family Communication: Updated patient's friend at the bedside on 1/27 Disposition Plan:  Remains inpatient appropriate: SNF pending, medically stable for discharge   Procedures:  MRI right ankle/foot     08/16/22            1) incision and drainage through bony cortex of right foot             2) preparation of wound bed with excisional debridement for skin substitute application, total area of debridement 4 cm             3) application skin substitute  Consultants:   ID Podiatry  Cardiology  Antimicrobials:   Anti-infectives (From admission, onward)    Start     Dose/Rate Route Frequency Ordered Stop   08/17/22 2000  ciprofloxacin (CIPRO) tablet 500 mg        500 mg Oral 2 times daily 08/17/22 1120     08/16/22 2200  ceFEPIme  (MAXIPIME) 2 g in sodium chloride 0.9 % 100 mL IVPB  Status:  Discontinued        2 g 200 mL/hr over 30 Minutes Intravenous 2 times daily 08/16/22 1638 08/17/22 1120   08/16/22 1615  ceFEPIme (MAXIPIME) 2 g in sodium chloride 0.9 % 100 mL IVPB        2 g 200 mL/hr over 30 Minutes Intravenous  Once 08/16/22 1603 08/16/22 1718   08/16/22 1600  ceFEPIme (MAXIPIME) 1 g in sodium chloride 0.9 % 100 mL IVPB  Status:  Discontinued        1 g 200 mL/hr over 30 Minutes Intravenous  Once 08/16/22 1559 08/16/22 1603   08/14/22 1700  ceFEPIme (MAXIPIME) 2 g in sodium chloride 0.9 % 100 mL IVPB  Status:  Discontinued        2 g 200 mL/hr over 30 Minutes Intravenous Every 24 hours 08/14/22 1641 08/15/22 1147   08/13/22 2230  ciprofloxacin (CIPRO) tablet 500 mg  Status:  Discontinued        500 mg Oral Daily 08/13/22 2209 08/14/22 1619          Medications  aspirin EC  81 mg Oral Daily   ciprofloxacin  500 mg Oral BID   docusate sodium  100 mg Oral BID   heparin  5,000 Units Subcutaneous Q8H   metoprolol succinate  12.5 mg Oral Daily   phosphorus  500 mg Oral BID      Subjective:   Hector Vazquez was seen and examined today.  Feels much better today with pain controlled and was able to sleep last night.  Still feels somewhat constipated.  No nausea or vomiting.,  Tolerating diet.  I  Objective:   Vitals:   08/19/22 0338 08/19/22 1322 08/19/22 1815 08/20/22 0510  BP: 111/69 114/73 95/61 95/70   Pulse: 73 79 88 79  Resp:  18 17 17   Temp: 97.6 F (36.4 C) (!) 97.4 F (36.3 C) (!) 97.4 F (36.3 C) 97.7 F (36.5 C)  TempSrc: Oral   Oral  SpO2: 99% 92% 99% 100%  Weight:      Height:        Intake/Output Summary (Last 24 hours) at 08/20/2022 1217 Last data filed at 08/20/2022 0900 Gross per 24 hour  Intake --  Output 1695 ml  Net -1695 ml     Wt Readings from Last 3 Encounters:  08/18/22 83.2 kg  07/01/22 81.4 kg  12/19/21 97 kg    Physical Exam General: Alert and oriented x  3, NAD, hearing deficit Cardiovascular: S1 S2 clear, RRR.  Respiratory: CTAB Gastrointestinal:  Soft, nontender, nondistended, NBS Ext: no pedal edema bilaterally Neuro: no new deficits Skin: Dressing intact right foot Psych: Normal affect pleasant   Data Reviewed:  I have personally reviewed following labs    CBC Lab Results  Component Value Date   WBC 7.1 08/20/2022   RBC 3.52 (L) 08/20/2022   HGB 11.8 (L) 08/20/2022   HCT 36.3 (L) 08/20/2022   MCV 103.1 (H) 08/20/2022   MCH 33.5 08/20/2022   PLT 335 08/20/2022   MCHC 32.5 08/20/2022   RDW 14.2 08/20/2022   LYMPHSABS 1.5 08/20/2022   MONOABS 0.6 08/20/2022   EOSABS 0.2 08/20/2022   BASOSABS 0.0 38/93/7342     Last metabolic panel Lab Results  Component Value Date   NA 135 08/20/2022   K 4.4 08/20/2022   CL 102 08/20/2022   CO2 24 08/20/2022   BUN 24 (H) 08/20/2022   CREATININE 1.34 (H) 08/20/2022   GLUCOSE 83 08/20/2022   GFRNONAA 50 (L) 08/20/2022   CALCIUM 8.1 (L) 08/20/2022   PHOS 3.7 08/20/2022   PROT 5.6 (L) 08/19/2022   ALBUMIN 3.0 (L) 08/19/2022   BILITOT 0.8 08/19/2022   ALKPHOS 31 (L) 08/19/2022   AST 24 08/19/2022   ALT 16 08/19/2022   ANIONGAP 9 08/20/2022    CBG (last 3)  No results for input(s): "GLUCAP" in the last 72 hours.    Coagulation Profile: Recent Labs  Lab 08/13/22 2008  INR 1.2     Radiology Studies: I have personally reviewed the imaging studies  No results found.     Estill Cotta M.D. [facility] 08/20/2022, 12:17 PM  Available via Epic secure chat 7am-7pm After 7 pm, please refer to night coverage provider listed on amion.

## 2022-08-20 NOTE — Progress Notes (Signed)
Physical Therapy Treatment Patient Details Name: Hector Vazquez MRN: [id number] DOB: November 19, 1929 Today's Date: 08/20/2022   History of Present Illness Hector Vazquez is a 89 y.o. male admitted with subacute osteomyelitis of R foot. 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  Currently living alone (Indep/driving)  Pt also HOH, so required repeat Instructions. Applied RIGHT Post Op shoe and LEFT person shoe.  General bed mobility comments: increased time and use of rail.General transfer comment: pt was unable to correctly stand at NWB R LE and required Mod/Max Assist to transfer from EOB to standing with a walker.  "I have to put my foot down to balance myself".  75% VC's/Instruction?Education on NWB as well as Education on location of 1st MTP joint.  Minimal pain with standing.  "Feels better than it did the other day".  Assisted from elevated bed to recliner Max Asisst as pt was unable to adhere to [place]. General Gait Details: Demonstarted and Educated on gait with walker "hopping" while NWBing then attempted to assist pt however he was unable to even advance one step.  VC's to push down on walker and lift body weight.  Pt still was unable to grasp the concept and kept lifting the walker.  Transfers only this session. Pt lives home alone.  Prior to admit he was Indep and driving.  Pt will need ST Rehab at SNF to address mobility and functional decline prior to safely returning home.   Recommendations for follow up therapy are one component of a multi-disciplinary discharge planning process, led by the attending physician.  Recommendations may be updated based on patient status, additional functional criteria and insurance authorization.  Follow Up Recommendations  Skilled nursing-short term rehab (<3 hours/day) Can patient  physically be transported by private vehicle: Yes   Assistance Recommended at Discharge Frequent or constant Supervision/Assistance  Patient can return home with the following A little help with walking and/or transfers;A little help with bathing/dressing/bathroom;Assistance with cooking/housework;Assist for transportation;Help with stairs or ramp for entrance   Equipment Recommendations       Recommendations for Other Services       Precautions / Restrictions Precautions Precautions: Fall Precaution Comments: 08/16/22 R foot I&D 2nd Osteomyelitis 1st MTP joint and Septis Arthritis Required Braces or Orthoses: Other Brace Other Brace: R foot post-op shoe Restrictions Weight Bearing Restrictions: Yes LLE Weight Bearing: Non weight bearing     Mobility  Bed Mobility Overal bed mobility: Needs Assistance Bed Mobility: Supine to Sit     Supine to sit: Min assist     General bed mobility comments: increased time and use of rail.    Transfers Overall transfer level: Needs assistance Equipment used: Rolling walker (2 wheels) Transfers: Sit to/from Stand Sit to Stand: Mod assist, Max assist   Step pivot transfers: Max assist       General transfer comment: pt was unable to correctly stand at NWB R LE and required Mod/Max Assist to transfer from EOB to standing with a walker.  "I have to put my foot down to balance myself".  75% VC's/Instruction?Education on NWB as well as Education on location of 1st MTP joint.  Minimal pain with standing.  "Feels better than it did the other day".  Assisted from elevated bed to recliner Max Asisst as pt was unable to adhere to  NWBing.    Ambulation/Gait               General Gait Details: Demonstarted and Educated on gait with walker "hopping" while NWBing then attempted to assist pt however he was unable to even advance one step.  VC's to push down on walker and lift body weight.  Pt still was unable to grasp the concept and kept  lifting the walker.  Transfers only this session.   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  Currently living alone (Indep/driving)  Pt also HOH, so required repeat Instructions.        Exercises      General Comments        Pertinent Vitals/Pain Pain Assessment Pain Assessment: Faces Faces Pain Scale: Hurts a little bit    Home Living                          Prior Function            PT Goals (current goals can now be found in the care plan section) Progress towards PT goals: Progressing toward goals    Frequency    Min 2X/week      PT Plan Current plan remains appropriate    Co-evaluation              AM-PAC PT "6 Clicks" Mobility   Outcome Measure  Help needed turning from your back to your side while in a flat bed without using bedrails?: A Lot Help needed moving from lying on your back to sitting on the side of a flat bed without using bedrails?: A Lot Help needed moving to and from a bed to a chair (including a wheelchair)?: A Lot Help needed standing up from a chair using your arms (e.g., wheelchair or bedside chair)?: A Lot Help needed to walk in hospital room?: Total Help needed climbing 3-5 steps with a railing? : Total 6 Click Score: 10    End of Session Equipment Utilized During Treatment: Gait belt Activity Tolerance: Patient limited by fatigue Patient left: in chair;with call bell/phone within reach Nurse  Communication: Mobility status PT Visit Diagnosis: Unsteadiness on feet (R26.81);Other abnormalities of gait and mobility (R26.89);Muscle weakness (generalized) (M62.81)     Time: 7616-0737 PT Time Calculation (min) (ACUTE ONLY): 25 min  Charges:  $Therapeutic Activity: 23-37 mins                     Rica Koyanagi  PTA Acute  Rehabilitation Services Office M-F          [phone] Weekend pager [phone]

## 2022-08-20 NOTE — Care Management Important Message (Signed)
Important Message  Patient Details IM Letter given. Name: Hector Vazquez MRN: [id number] Date of Birth: [birth year]   Medicare Important Message Given:  Yes     Kerin Salen 08/20/2022, 11:20 AM

## 2022-08-20 NOTE — Plan of Care (Signed)
Alert and oriented. Dressing in placed on R foot, c/o pain, medicated per MAR.  Problem: Education: Goal: Knowledge of General Education information will improve Description: Including pain rating scale, medication(s)/side effects and non-pharmacologic comfort measures Outcome: Progressing   Problem: Health Behavior/Discharge Planning: Goal: Ability to manage health-related needs will improve Outcome: Progressing   Problem: Clinical Measurements: Goal: Ability to maintain clinical measurements within normal limits will improve Outcome: Progressing Goal: Will remain free from infection Outcome: Progressing Goal: Diagnostic test results will improve Outcome: Progressing Goal: Respiratory complications will improve Outcome: Progressing Goal: Cardiovascular complication will be avoided Outcome: Progressing   Problem: Activity: Goal: Risk for activity intolerance will decrease Outcome: Progressing   Problem: Nutrition: Goal: Adequate nutrition will be maintained Outcome: Progressing   Problem: Coping: Goal: Level of anxiety will decrease Outcome: Progressing   Problem: Elimination: Goal: Will not experience complications related to bowel motility Outcome: Progressing Goal: Will not experience complications related to urinary retention Outcome: Progressing   Problem: Pain Managment: Goal: General experience of comfort will improve Outcome: Progressing   Problem: Safety: Goal: Ability to remain free from injury will improve Outcome: Progressing   Problem: Skin Integrity: Goal: Risk for impaired skin integrity will decrease Outcome: Progressing

## 2022-08-20 NOTE — Progress Notes (Signed)
Wound care on right foot completed according to orders at 1630. Incisions were clean, dry, and intact. Patient tolerated dressing change well with no complaints of pain.   Dewayne Hatch, RN

## 2022-08-21 DIAGNOSIS — M86271 Subacute osteomyelitis, right ankle and foot: Secondary | ICD-10-CM | POA: Diagnosis not present

## 2022-08-21 DIAGNOSIS — N179 Acute kidney failure, unspecified: Secondary | ICD-10-CM | POA: Diagnosis not present

## 2022-08-21 DIAGNOSIS — L089 Local infection of the skin and subcutaneous tissue, unspecified: Secondary | ICD-10-CM | POA: Diagnosis not present

## 2022-08-21 LAB — CBC WITH DIFFERENTIAL/PLATELET
Abs Immature Granulocytes: 0.05 10*3/uL (ref 0.00–0.07)
Basophils Absolute: 0.1 10*3/uL (ref 0.0–0.1)
Basophils Relative: 1 %
Eosinophils Absolute: 0.2 10*3/uL (ref 0.0–0.5)
Eosinophils Relative: 3 %
HCT: 38.3 % — ABNORMAL LOW (ref 39.0–52.0)
Hemoglobin: 12.3 g/dL — ABNORMAL LOW (ref 13.0–17.0)
Immature Granulocytes: 1 %
Lymphocytes Relative: 20 %
Lymphs Abs: 1.4 10*3/uL (ref 0.7–4.0)
MCH: 33.5 pg (ref 26.0–34.0)
MCHC: 32.1 g/dL (ref 30.0–36.0)
MCV: 104.4 fL — ABNORMAL HIGH (ref 80.0–100.0)
Monocytes Absolute: 0.6 10*3/uL (ref 0.1–1.0)
Monocytes Relative: 9 %
Neutro Abs: 4.5 10*3/uL (ref 1.7–7.7)
Neutrophils Relative %: 66 %
Platelets: 322 10*3/uL (ref 150–400)
RBC: 3.67 MIL/uL — ABNORMAL LOW (ref 4.22–5.81)
RDW: 14.1 % (ref 11.5–15.5)
WBC: 6.8 10*3/uL (ref 4.0–10.5)
nRBC: 0 % (ref 0.0–0.2)

## 2022-08-21 LAB — BASIC METABOLIC PANEL
Anion gap: 7 (ref 5–15)
BUN: 24 mg/dL — ABNORMAL HIGH (ref 8–23)
CO2: 23 mmol/L (ref 22–32)
Calcium: 8.4 mg/dL — ABNORMAL LOW (ref 8.9–10.3)
Chloride: 102 mmol/L (ref 98–111)
Creatinine, Ser: 1.2 mg/dL (ref 0.61–1.24)
GFR, Estimated: 57 mL/min — ABNORMAL LOW (ref >60)
Glucose, Bld: 98 mg/dL (ref 70–99)
Potassium: 4.1 mmol/L (ref 3.5–5.1)
Sodium: 132 mmol/L — ABNORMAL LOW (ref 135–145)

## 2022-08-21 LAB — AEROBIC/ANAEROBIC CULTURE W GRAM STAIN (SURGICAL/DEEP WOUND): Culture: NO GROWTH

## 2022-08-21 NOTE — Progress Notes (Signed)
[facility]                                                                              Hector Vazquez, is a 89 y.o. male, DOB - [birth year], NID:[id number] Admit date - 08/13/2022    Outpatient Primary MD for the patient is Garwin Brothers, MD  LOS - 6  days  Chief Complaint  Patient presents with   Osteomyelitis        Brief summary   Patient is a 89 year old male with paroxysmal Afib not on anticoagulation due to fall risk, HTN, CKD stage IIIa, chronic osteomyelitis of the right calcaneus and recent osteomyelitis of first MTP head.  Patient has been followed at wound care clinic for some time and has been changing his dressings twice a day on his feet, lives at home by himself with no significant social support system at home. His last office visit wound care clinic [facility]) was on 1/18.  During that visit, his previous wound cultures grew Pseudomonas aeruginosa, sensitive to Cipro.  Xrays showed osteomyelitis of the calcaneus.  He was placed empirically on Doxy the week before.  Wound care MD documented that the patient has exposed bone of the right first metatarsal head, recommended MRI and nonweightbearing on the right leg.  Assessment & Plan    Principal Problem:   Subacute osteomyelitis of right foot (HCC)/septic arthritis -Wound cultures from the wound clinic grew pansensitive Pseudomonas and had recommended MRI of the right foot -MRI showed septic arthritis of the first MTP joint with osteomyelitis of the medial sesamoid and early osteomyelitis of the periarticular first metatarsal head and proximal phalanx.  Adjacent soft tissue cellulitis and plantar ulcer. -Podiatry consulted, underwent I&D through bone cortex of the right foot on 1/25 -ID consulted, no new positive cultures.  Recommended to continue ciprofloxacin 750 mg BID for 6 weeks from the time of surgery, stop date on 09/26/2022. -Pain controlled.    Active Problems:   AKI (acute kidney injury)  ([place]) on CKD stage IIIa -Baseline creatinine 1.6 on 07/01/2022, admitted with creatinine of 2.5 -Likely worsened due to #1, received IV albumin -Creatinine improved, 1.2    Essential hypertension -BP stable, continue Toprol-XL 12.5 mg daily  Chronic combined systolic and diastolic CHF  ([place]) -2D echo 05/2022 had shown EF of 40 to 45%, global hypokinesis, indeterminate diastolic parameters -Currently euvolemic    PAF (paroxysmal atrial fibrillation) (HCC) -HR controlled, continue Toprol-XL -Not on anticoagulation due to risk of falls, patient to discuss with his primary cardiologist outpatient  Hypophosphatemia -Placed on phosphorus replacement, improved to 3.7  Pressure injury documentation -Right heel unstageable, POA  Constipation -Resolved, continue MiraLAX 17 g p.o. daily PRN,  Colace 100 mg BID  Code Status: Full CODE STATUS DVT Prophylaxis:  heparin injection 5,000 Units Start: 08/14/22 0600 SCDs Start: 08/14/22 0027   Level of Care: Level of care: Med-Surg Family Communication: Updated patient's friend at the bedside on 1/27 Disposition Plan:      Remains inpatient appropriate: SNF pending, medically stable for discharge   Procedures:  MRI right ankle/foot     08/16/22  1) incision and drainage through bony cortex of right foot             2) preparation of wound bed with excisional debridement for skin substitute application, total area of debridement 4 cm             3) application skin substitute  Consultants:   ID Podiatry  Cardiology  Antimicrobials:   Anti-infectives (From admission, onward)    Start     Dose/Rate Route Frequency Ordered Stop   08/17/22 2000  ciprofloxacin (CIPRO) tablet 500 mg        500 mg Oral 2 times daily 08/17/22 1120     08/16/22 2200  ceFEPIme (MAXIPIME) 2 g in sodium chloride 0.9 % 100 mL IVPB  Status:  Discontinued        2 g 200 mL/hr over 30 Minutes Intravenous 2 times daily 08/16/22 1638 08/17/22 1120    08/16/22 1615  ceFEPIme (MAXIPIME) 2 g in sodium chloride 0.9 % 100 mL IVPB        2 g 200 mL/hr over 30 Minutes Intravenous  Once 08/16/22 1603 08/16/22 1718   08/16/22 1600  ceFEPIme (MAXIPIME) 1 g in sodium chloride 0.9 % 100 mL IVPB  Status:  Discontinued        1 g 200 mL/hr over 30 Minutes Intravenous  Once 08/16/22 1559 08/16/22 1603   08/14/22 1700  ceFEPIme (MAXIPIME) 2 g in sodium chloride 0.9 % 100 mL IVPB  Status:  Discontinued        2 g 200 mL/hr over 30 Minutes Intravenous Every 24 hours 08/14/22 1641 08/15/22 1147   08/13/22 2230  ciprofloxacin (CIPRO) tablet 500 mg  Status:  Discontinued        500 mg Oral Daily 08/13/22 2209 08/14/22 1619          Medications  aspirin EC  81 mg Oral Daily   ciprofloxacin  500 mg Oral BID   docusate sodium  100 mg Oral BID   heparin  5,000 Units Subcutaneous Q8H   metoprolol succinate  12.5 mg Oral Daily   phosphorus  500 mg Oral BID      Subjective:   Hector Vazquez was seen and examined today.  Pain controlled had a bowel movement yesterday.  Had a BM yesterday, feeling a lot better.  No acute nausea vomiting, chest pain or shortness of breath.  No fevers, tolerating diet.    Objective:   Vitals:   08/19/22 1815 08/20/22 0510 08/20/22 1922 08/21/22 0517  BP: 95/61 95/70 (!) 136/98 109/77  Pulse: 88 79 89 96  Resp: 17 17 17 18   Temp: (!) 97.4 F (36.3 C) 97.7 F (36.5 C) 98.4 F (36.9 C) 97.7 F (36.5 C)  TempSrc:  Oral Oral Oral  SpO2: 99% 100% 97% 98%  Weight:      Height:        Intake/Output Summary (Last 24 hours) at 08/21/2022 1344 Last data filed at 08/21/2022 0615 Gross per 24 hour  Intake --  Output 800 ml  Net -800 ml     Wt Readings from Last 3 Encounters:  08/18/22 83.2 kg  07/01/22 81.4 kg  12/19/21 97 kg   Physical Exam General: Alert and oriented x 3, NAD Cardiovascular: S1 S2 clear, RRR.  Respiratory: CTAB Gastrointestinal: Soft, nontender, nondistended, NBS Ext: no pedal edema  bilaterally Neuro: no new deficits Skin: Dressing intact right foot Psych: Normal affect    Data Reviewed:  I have personally  reviewed following labs    CBC Lab Results  Component Value Date   WBC 6.8 08/21/2022   RBC 3.67 (L) 08/21/2022   HGB 12.3 (L) 08/21/2022   HCT 38.3 (L) 08/21/2022   MCV 104.4 (H) 08/21/2022   MCH 33.5 08/21/2022   PLT 322 08/21/2022   MCHC 32.1 08/21/2022   RDW 14.1 08/21/2022   LYMPHSABS 1.4 08/21/2022   MONOABS 0.6 08/21/2022   EOSABS 0.2 08/21/2022   BASOSABS 0.1 00/93/8182     Last metabolic panel Lab Results  Component Value Date   NA 132 (L) 08/21/2022   K 4.1 08/21/2022   CL 102 08/21/2022   CO2 23 08/21/2022   BUN 24 (H) 08/21/2022   CREATININE 1.20 08/21/2022   GLUCOSE 98 08/21/2022   GFRNONAA 57 (L) 08/21/2022   CALCIUM 8.4 (L) 08/21/2022   PHOS 3.7 08/20/2022   PROT 5.6 (L) 08/19/2022   ALBUMIN 3.0 (L) 08/19/2022   BILITOT 0.8 08/19/2022   ALKPHOS 31 (L) 08/19/2022   AST 24 08/19/2022   ALT 16 08/19/2022   ANIONGAP 7 08/21/2022    CBG (last 3)  No results for input(s): "GLUCAP" in the last 72 hours.    Coagulation Profile: No results for input(s): "INR", "PROTIME" in the last 168 hours.    Radiology Studies: I have personally reviewed the imaging studies  No results found.     Estill Cotta M.D. [facility] 08/21/2022, 1:44 PM  Available via Epic secure chat 7am-7pm After 7 pm, please refer to night coverage provider listed on amion.

## 2022-08-21 NOTE — Plan of Care (Signed)

## 2022-08-21 NOTE — TOC Progression Note (Signed)
Transition of Care [facility]) - Progression Note    Patient Details  Name: Hector Vazquez MRN: [id number] Date of Birth: 1930-02-03  Transition of Care [facility]) CM/SW Contact  Lennart Pall, LCSW Phone Number: 08/21/2022, 2:52 PM  Clinical Narrative:     Pt has accepted SNF bed with [facility] in [place] and insurance authorization has begun.  Expected Discharge Plan: [place] Barriers to Discharge: SNF Pending bed offer, Continued Medical Work up  Expected Discharge Plan and Services In-house Referral: Clinical Social Work Discharge Planning Services: CM Consult Post Acute Care Choice: [place] Living arrangements for the past 2 months: Single Family Home                 DME Arranged: N/A DME Agency: NA                   Social Determinants of Health (SDOH) Interventions SDOH Screenings   Food Insecurity: No Food Insecurity (08/14/2022)  Housing: Low Risk  (08/14/2022)  Transportation Needs: No Transportation Needs (08/14/2022)  Utilities: Not At Risk (08/14/2022)  Tobacco Use: Low Risk  (08/17/2022)    Readmission Risk Interventions    08/16/2022    3:47 PM  Readmission Risk Prevention Plan  Transportation Screening Complete  PCP or Specialist Appt within 5-7 Days Complete  Home Care Screening Complete  Medication Review (RN CM) Complete

## 2022-08-22 DIAGNOSIS — M86271 Subacute osteomyelitis, right ankle and foot: Secondary | ICD-10-CM | POA: Diagnosis not present

## 2022-08-22 DIAGNOSIS — M00871 Arthritis due to other bacteria, right ankle and foot: Secondary | ICD-10-CM

## 2022-08-22 DIAGNOSIS — L89619 Pressure ulcer of right heel, unspecified stage: Secondary | ICD-10-CM | POA: Insufficient documentation

## 2022-08-22 DIAGNOSIS — I10 Essential (primary) hypertension: Secondary | ICD-10-CM | POA: Diagnosis not present

## 2022-08-22 DIAGNOSIS — N179 Acute kidney failure, unspecified: Secondary | ICD-10-CM | POA: Diagnosis not present

## 2022-08-22 DIAGNOSIS — I5040 Unspecified combined systolic (congestive) and diastolic (congestive) heart failure: Secondary | ICD-10-CM | POA: Diagnosis not present

## 2022-08-22 DIAGNOSIS — M009 Pyogenic arthritis, unspecified: Secondary | ICD-10-CM | POA: Insufficient documentation

## 2022-08-22 LAB — CBC WITH DIFFERENTIAL/PLATELET
Abs Immature Granulocytes: 0.04 10*3/uL (ref 0.00–0.07)
Basophils Absolute: 0.1 10*3/uL (ref 0.0–0.1)
Basophils Relative: 1 %
Eosinophils Absolute: 0.2 10*3/uL (ref 0.0–0.5)
Eosinophils Relative: 3 %
HCT: 40.9 % (ref 39.0–52.0)
Hemoglobin: 13.3 g/dL (ref 13.0–17.0)
Immature Granulocytes: 1 %
Lymphocytes Relative: 24 %
Lymphs Abs: 1.8 10*3/uL (ref 0.7–4.0)
MCH: 33.8 pg (ref 26.0–34.0)
MCHC: 32.5 g/dL (ref 30.0–36.0)
MCV: 103.8 fL — ABNORMAL HIGH (ref 80.0–100.0)
Monocytes Absolute: 0.7 10*3/uL (ref 0.1–1.0)
Monocytes Relative: 9 %
Neutro Abs: 4.6 10*3/uL (ref 1.7–7.7)
Neutrophils Relative %: 62 %
Platelets: 352 10*3/uL (ref 150–400)
RBC: 3.94 MIL/uL — ABNORMAL LOW (ref 4.22–5.81)
RDW: 14.1 % (ref 11.5–15.5)
WBC: 7.3 10*3/uL (ref 4.0–10.5)
nRBC: 0 % (ref 0.0–0.2)

## 2022-08-22 NOTE — Hospital Course (Addendum)
Hector Vazquez is a 89 y.o. male with a history of atrial fibrillation not on anticoagulation secondary to fall risk, hypertension, CKD stage IIIa, chronic osteomyelitis of right calcaneous, subacute osteomyelitis of right first MTP. Patient presented at the recommendation of his wound care doctor for evaluation and management of his foot wound. Patient with history of pseudomonas on prior wound culture and so was started on Ciprofloxacin/Cefepime this admission. Podiatry consulted and performed an I&D with excisional debridement on 08/16/22. Wound cultures obtained this admission without organisms. ID consulted with recommendations for Ciprofloxacin x6 weeks from surgery.

## 2022-08-22 NOTE — Progress Notes (Signed)
PROGRESS NOTE    [name]  XIP:[id number] DOB: [birth year] DOA: 08/13/2022 PCP: Garwin Brothers, MD   Brief Narrative: Hector Vazquez is a 89 y.o. male with a history of atrial fibrillation not on anticoagulation secondary to fall risk, hypertension, CKD stage IIIa, chronic osteomyelitis of right calcaneous, subacute osteomyelitis of right first MTP. Patient presented at the recommendation of his wound care doctor for evaluation and management of his foot wound. Patient with history of pseudomonas on prior wound culture and so was started on Ciprofloxacin/Cefepime this admission. Podiatry consulted and performed an I&D with excisional debridement on 08/16/22. Wound cultures obtained this admission without organisms. ID consulted with recommendations for Ciprofloxacin x6 weeks from surgery.   Assessment and Plan: * Subacute osteomyelitis of right foot ([place]) Patient started on Ciprofloxacin secondary to outpatient cultures growing pseudomonas. MRI obtained this admission and was significant for septic arthritis of first MTP joint with osteomyelitis of medial sesamoid and early osteomyelitis of the periarticular first metatarsal head and proximal phalanx. Podiatry consulted and performed I&D with excisional debridement on 08/16/22. ID consulted and have recommended Ciprofloxacin 500 mg (renally dosed from original 750 mg recommendation) for a total of 6 weeks of treatment from date of surgery. -Continue Ciprofloxacin 500 mg BID x6 weeks from 08/16/22  AKI (acute kidney injury) (HCC)-resolved as of 08/22/2022 Creatinine of 2.56 on admission. Resolved with IV fluids.  Heart failure with reduced ejection fraction and diastolic dysfunction (HCC) Stable and without evidence of acute overload. Patient is on metoprolol succinate and torsemide as an outpatient. It appears patient is on diltiazem as an outpatient for his atrial fibrillation which was held on admission. -Continue metoprolol succinate  Unspecified  hearing loss, unspecified ear Chronic.  Stage 3a chronic kidney disease (CKD) (HCC) - baseline SCr 1.6 Baseline creatinine of about 1.6.  Essential hypertension -Continue metoprolol succinate 12.5 mg daily  Pressure injury of skin of right heel Present on admission.  PAF (paroxysmal atrial fibrillation) ([place]) Patient is no longer on Coumadin for anticoagulation presumably secondary to fall risk. He is on diltiazem and Toprol XL. On thorough chart review, patient was last seen by his Cardiologist on 03/14/22 with history of preserved EF. Patient recently discharged from hospital on 07/02/22 with newly reduced EF of 40-45% and diltiazem held at that time. Rate is controlled this admission on Toprol XL. -Continue Toprol XL; increase as needed -Outpatient cardiology follow-up    DVT prophylaxis: Heparin subq Code Status:   Code Status: Full Code Family Communication: Friend at bedside Disposition Plan: Discharge to SNF once bed is available. Medically stable for discharge.   Consultants:  Podiatry Infectious disease  Procedures:  Podiatry surgery (08/16/2022) incision and drainage through bony cortex of right foot preparation of wound bed with excisional debridement for skin substitute application, total area of debridement 4 cm application skin substitute  Antimicrobials: Ciprofloxacin Cefepime    Subjective: Patient reports wanting a new post-op shoe. His friend reports he has been placing weight on his right foot with subsequent pain. No other issues noted.  Objective: BP 108/62 (BP Location: Left Arm)   Pulse 99   Temp 98.6 F (37 C) (Oral)   Resp 17   Ht 6\' 3"  (1.905 m)   Wt 81.1 kg   SpO2 98%   BMI 22.35 kg/m   Examination:  General exam: Appears calm and comfortable  Respiratory system: Clear to auscultation. Respiratory effort normal. Cardiovascular system: S1 & S2 heard, irregular rhythm with normal rate. Gastrointestinal system: Abdomen is  nondistended, soft and nontender. Central nervous system: Alert and oriented. No focal neurological deficits. Musculoskeletal: No calf tenderness    Data Reviewed: I have personally reviewed following labs and imaging studies  CBC Lab Results  Component Value Date   WBC 7.3 08/22/2022   RBC 3.94 (L) 08/22/2022   HGB 13.3 08/22/2022   HCT 40.9 08/22/2022   MCV 103.8 (H) 08/22/2022   MCH 33.8 08/22/2022   PLT 352 08/22/2022   MCHC 32.5 08/22/2022   RDW 14.1 08/22/2022   LYMPHSABS 1.8 08/22/2022   MONOABS 0.7 08/22/2022   EOSABS 0.2 08/22/2022   BASOSABS 0.1 05/16/8526     Last metabolic panel Lab Results  Component Value Date   NA 132 (L) 08/21/2022   K 4.1 08/21/2022   CL 102 08/21/2022   CO2 23 08/21/2022   BUN 24 (H) 08/21/2022   CREATININE 1.20 08/21/2022   GLUCOSE 98 08/21/2022   GFRNONAA 57 (L) 08/21/2022   CALCIUM 8.4 (L) 08/21/2022   PHOS 3.7 08/20/2022   PROT 5.6 (L) 08/19/2022   ALBUMIN 3.0 (L) 08/19/2022   BILITOT 0.8 08/19/2022   ALKPHOS 31 (L) 08/19/2022   AST 24 08/19/2022   ALT 16 08/19/2022   ANIONGAP 7 08/21/2022    GFR: Estimated Creatinine Clearance: 46.9 mL/min (by C-G formula based on SCr of 1.2 mg/dL).  Recent Results (from the past 240 hour(s))  Aerobic Culture w Gram Stain (superficial specimen)     Status: None   Collection Time: 08/14/22  4:02 PM   Specimen: Wound  Result Value Ref Range Status   Specimen Description   Final    WOUND Performed at [facility] [address], [place], [place] [address]    Special Requests   Final    NONE Performed at [facility], [place] [address] [place], [place] [address]    Gram Stain NO WBC SEEN NO ORGANISMS SEEN   Final   Culture   Final    NO GROWTH 3 DAYS Performed at [facility], [place] [address], [place], [place] [address]    Report Status 08/17/2022 FINAL  Final  Aerobic/Anaerobic Culture w Gram Stain (surgical/deep wound)     Status:  None   Collection Time: 08/16/22  4:35 PM   Specimen: PATH Other; Body Fluid  Result Value Ref Range Status   Specimen Description   Final    WOUND RIGHT FOOT JOINT Performed at [facility] [address] [place], [place] [address]    Special Requests   Final    NONE Performed at [facility], [place] [address] [place], [place] [address]    Gram Stain   Final    RARE WBC PRESENT,BOTH PMN AND MONONUCLEAR NO ORGANISMS SEEN    Culture   Final    No growth aerobically or anaerobically. Performed at [place] [facility], [place] [address], [place], [place] [address]    Report Status 08/21/2022 FINAL  Final      Radiology Studies: No results found.    LOS: 7 days    Cordelia Poche, MD [facility] 08/22/2022, 3:58 PM   If 7PM-7AM, please contact night-coverage [email or web address]

## 2022-08-22 NOTE — Progress Notes (Signed)
Orthopedic Tech Progress Note Patient Details:  Hector Vazquez 25-Jan-1930 [id number]  Patient ID: [name], male   DOB: January 30, 1930, 89 y.o.   MRN: [id number]  [name] 08/22/2022, 1:39 PM Right post op shoe applied

## 2022-08-22 NOTE — Assessment & Plan Note (Signed)
Present on admission

## 2022-08-23 DIAGNOSIS — M86271 Subacute osteomyelitis, right ankle and foot: Secondary | ICD-10-CM | POA: Diagnosis not present

## 2022-08-23 LAB — CBC WITH DIFFERENTIAL/PLATELET
Abs Immature Granulocytes: 0.04 10*3/uL (ref 0.00–0.07)
Basophils Absolute: 0.1 10*3/uL (ref 0.0–0.1)
Basophils Relative: 1 %
Eosinophils Absolute: 0.3 10*3/uL (ref 0.0–0.5)
Eosinophils Relative: 4 %
HCT: 36.8 % — ABNORMAL LOW (ref 39.0–52.0)
Hemoglobin: 11.9 g/dL — ABNORMAL LOW (ref 13.0–17.0)
Immature Granulocytes: 1 %
Lymphocytes Relative: 25 %
Lymphs Abs: 1.8 10*3/uL (ref 0.7–4.0)
MCH: 33.9 pg (ref 26.0–34.0)
MCHC: 32.3 g/dL (ref 30.0–36.0)
MCV: 104.8 fL — ABNORMAL HIGH (ref 80.0–100.0)
Monocytes Absolute: 0.7 10*3/uL (ref 0.1–1.0)
Monocytes Relative: 10 %
Neutro Abs: 4.4 10*3/uL (ref 1.7–7.7)
Neutrophils Relative %: 59 %
Platelets: 350 10*3/uL (ref 150–400)
RBC: 3.51 MIL/uL — ABNORMAL LOW (ref 4.22–5.81)
RDW: 14.6 % (ref 11.5–15.5)
WBC: 7.3 10*3/uL (ref 4.0–10.5)
nRBC: 0 % (ref 0.0–0.2)

## 2022-08-23 MED ORDER — METOPROLOL SUCCINATE ER 25 MG PO TB24: 12.5000 mg | ORAL_TABLET | Freq: Every day | ORAL | Status: DC

## 2022-08-23 MED ORDER — CIPROFLOXACIN HCL 500 MG PO TABS: 500.0000 mg | ORAL_TABLET | Freq: Two times a day (BID) | ORAL | Status: AC

## 2022-08-23 MED ORDER — POLYETHYLENE GLYCOL 3350 17 G PO PACK: 17.0000 g | PACK | Freq: Every day | ORAL | 0 refills | Status: AC | PRN

## 2022-08-23 NOTE — Discharge Summary (Signed)
Physician Discharge Summary   Patient: Hector Vazquez MRN: [id number] DOB: 01/01/1930  Admit date:     08/13/2022  Discharge date: 08/23/22  Discharge Physician: Jacquelin Hawking, MD   PCP: [name], MD   Recommendations at discharge:  Follow-up with PCP, Podiatry and infectious disease Minimal weight bearing as tolerated with post-op shoe and walker of right lower extremity Ciprofloxacin for a total of 6 weeks (end date of 09/28/2022)  Discharge Diagnoses: Principal Problem:   Subacute osteomyelitis of right foot (HCC) Active Problems:   Essential hypertension   Stage 3a chronic kidney disease (CKD) (HCC) - baseline SCr 1.6   Unspecified hearing loss, unspecified ear   Heart failure with reduced ejection fraction and diastolic dysfunction (HCC)   PAF (paroxysmal atrial fibrillation) (HCC)   Osteomyelitis (HCC)   Ulcer of right heel and midfoot with fat layer exposed (HCC)   Septic arthritis of foot (HCC)   Pressure injury of skin of right heel  Resolved Problems:   AKI (acute kidney injury) [facility])  Hospital Course: Barack Hanauer is a 89 y.o. male with a history of atrial fibrillation not on anticoagulation secondary to fall risk, hypertension, CKD stage IIIa, chronic osteomyelitis of right calcaneous, subacute osteomyelitis of right first MTP. Patient presented at the recommendation of his wound care doctor for evaluation and management of his foot wound. Patient with history of pseudomonas on prior wound culture and so was started on Ciprofloxacin/Cefepime this admission. Podiatry consulted and performed an I&D with excisional debridement on 08/16/22. Wound cultures obtained this admission without organisms. ID consulted with recommendations for Ciprofloxacin x6 weeks from surgery.  Assessment and Plan: * Subacute osteomyelitis of right foot (HCC) Patient started on Ciprofloxacin secondary to outpatient cultures growing pseudomonas. MRI obtained this admission and was significant for septic  arthritis of first MTP joint with osteomyelitis of medial sesamoid and early osteomyelitis of the periarticular first metatarsal head and proximal phalanx. Podiatry consulted and performed I&D with excisional debridement on 08/16/22. ID consulted and have recommended Ciprofloxacin 500 mg (renally dosed from original 750 mg recommendation) for a total of 6 weeks of treatment from date of surgery. Continue Ciprofloxacin 500 mg BID x6 weeks. End date 09/28/2022  AKI (acute kidney injury) (HCC)-resolved as of 08/22/2022 Creatinine of 2.56 on admission. Resolved with IV fluids.  Heart failure with reduced ejection fraction and diastolic dysfunction (HCC) Stable and without evidence of acute overload. Patient is on metoprolol succinate and torsemide as an outpatient. Continue metoprolol succinate (reduced dose) and torsemide on discharge.  Unspecified hearing loss, unspecified ear Chronic.  Stage 3a chronic kidney disease (CKD) (HCC) - baseline SCr 1.6 Baseline creatinine of about 1.6.  Essential hypertension Continue metoprolol succinate 12.5 mg daily  Pressure injury of skin of right heel Present on admission.  PAF (paroxysmal atrial fibrillation) (HCC) Patient is no longer on Coumadin for anticoagulation presumably secondary to fall risk. He is on diltiazem and Toprol XL. On thorough chart review, patient was last seen by his Cardiologist on 03/14/22 with history of preserved EF. Patient recently discharged from hospital on 07/02/22 with newly reduced EF of 40-45% and diltiazem held at that time. Rate is controlled this admission on Toprol XL. Continue Toprol Xl and discontinue diltiazem. Patient can follow-up with his outpatient cardiologist.   Consultants: Podiatry, Infectious disease Procedures performed:  Podiatry surgery (08/16/2022) incision and drainage through bony cortex of right foot preparation of wound bed with excisional debridement for skin substitute application, total area of  debridement 4 cm application skin  substitute   Disposition: Skilled nursing facility Diet recommendation: Cardiac diet   DISCHARGE MEDICATION: Allergies as of 08/23/2022   No Known Allergies      Medication List     STOP taking these medications    diltiazem 240 MG 24 hr capsule Commonly known as: CARDIZEM CD   doxycycline 100 MG capsule Commonly known as: MONODOX   midodrine 5 MG tablet Commonly known as: PROAMATINE       TAKE these medications    b complex vitamins capsule Take 1 capsule by mouth daily.   ciprofloxacin 500 MG tablet Commonly known as: CIPRO Take 1 tablet (500 mg total) by mouth 2 (two) times daily. Take for 36 days from date of prescription What changed: additional instructions   lovastatin 20 MG tablet Commonly known as: MEVACOR Take 20 mg by mouth at bedtime.   magnesium oxide 400 (240 Mg) MG tablet Commonly known as: MAG-OX Take 400 mg by mouth daily.   metoprolol succinate 25 MG 24 hr tablet Commonly known as: TOPROL-XL Take 0.5 tablets (12.5 mg total) by mouth daily. Start taking on: August 24, 2022 What changed:  how much to take when to take this   polyethylene glycol 17 g packet Commonly known as: MIRALAX / GLYCOLAX Take 17 g by mouth daily as needed for moderate constipation.   torsemide 20 MG tablet Commonly known as: DEMADEX Take 20 mg by mouth daily.   traMADol 50 MG tablet Commonly known as: ULTRAM Take 1 tablet (50 mg total) by mouth every 8 (eight) hours as needed for moderate pain or severe pain.   Vitamin D3 25 MCG (1000 UT) Caps Take 1,000 Units by mouth daily.               Discharge Care Instructions  (From admission, onward)           Start     Ordered   08/23/22 0000  Discharge wound care:       Comments: Dressing changes MWF Plantar incision: Xeroform  Plantar heel: Do not remove petroleum gauze secured with staples.  Apply surgical lubricant.  Apply nonadherent petroleum gauze.  4 x 4  gauze, large Kerlix, Ace wrapd   08/23/22 1132            Follow-up Information     Garwin Brothers, MD Follow up in 1 week(s).   Specialty: Internal Medicine Why: For hospital follow-up Contact information: [place] [address] [phone]         [name], DPM Follow up in 1 week(s).   Specialty: Podiatry Why: For hospital follow-up, For wound re-check Contact information: [address] [place] [address] [phone]         Laurice Record, MD Follow up on 08/29/2022.   Specialty: Infectious Diseases Why: 3:30 PM, For hospital follow-up Contact information: [address] [place] [address] [phone]                Discharge Exam: BP 102/64 (BP Location: Left Arm)   Pulse 85   Temp 97.6 F (36.4 C) (Oral)   Resp 18   Ht 6\' 3"  (1.905 m)   Wt 81.7 kg   SpO2 100%   BMI 22.51 kg/m   General exam: Appears calm and comfortable Respiratory system: Respiratory effort normal. Central nervous system: Alert and oriented. Psychiatry: Judgement and insight appear normal. Mood & affect appropriate.   Condition at discharge: stable  The  results of significant diagnostics from this hospitalization (including imaging, microbiology, ancillary and laboratory) are listed below for reference.   Imaging Studies: VAS [place] ABI WITH/WO TBI  Result Date: 08/14/2022  LOWER EXTREMITY DOPPLER STUDY Patient Name:  Dolores Saathoff  Date of Exam:   08/14/2022 Medical Rec #: [id number]    Accession #:    [id number] Date of Birth: Jun 09, 1930     Patient Gender: M Patient Age:   89 years Exam Location:  [facility] Procedure:      VAS [place] ABI WITH/WO TBI Referring Phys: ERIC CHEN --------------------------------------------------------------------------------  Indications: Ulceration. High Risk Factors: Coronary artery disease.  Comparison Study: no prior Performing Technologist: [place] Ponder RVS  Examination Guidelines: A complete  evaluation includes at minimum, Doppler waveform signals and systolic blood pressure reading at the level of bilateral brachial, anterior tibial, and posterior tibial arteries, when vessel segments are accessible. Bilateral testing is considered an integral part of a complete examination. Photoelectric Plethysmograph (PPG) waveforms and toe systolic pressure readings are included as required and additional duplex testing as needed. Limited examinations for reoccurring indications may be performed as noted.  ABI Findings: +---------+------------------+-----+---------+--------+ Right    Rt Pressure (mmHg)IndexWaveform Comment  +---------+------------------+-----+---------+--------+ Brachial 100                    triphasic         +---------+------------------+-----+---------+--------+ PTA      128               1.25 triphasic         +---------+------------------+-----+---------+--------+ DP       102               1.00 triphasic         +---------+------------------+-----+---------+--------+ Great Toe86                0.84 Normal            +---------+------------------+-----+---------+--------+ +---------+------------------+-----+---------+-------+ Left     Lt Pressure (mmHg)IndexWaveform Comment +---------+------------------+-----+---------+-------+ Brachial 102                    triphasic        +---------+------------------+-----+---------+-------+ PTA      128               1.25 triphasic        +---------+------------------+-----+---------+-------+ DP       126               1.24 triphasic        +---------+------------------+-----+---------+-------+ Great Toe97                0.95 Normal           +---------+------------------+-----+---------+-------+ +-------+-----------+-----------+------------+------------+ ABI/TBIToday's ABIToday's TBIPrevious ABIPrevious TBI +-------+-----------+-----------+------------+------------+ Right  1.25        0.84                                +-------+-----------+-----------+------------+------------+ Left   1.25       0.95                                +-------+-----------+-----------+------------+------------+  Summary: Right: Resting right ankle-brachial index is within normal range. The right toe-brachial index is normal. Left: Resting left ankle-brachial index is within normal range. The left toe-brachial index is normal. *See table(s) above for measurements and  observations.  Electronically signed by Waverly Ferrari MD on 08/14/2022 at 4:18:46 PM.    Final    MR FOOT RIGHT W WO CONTRAST  Result Date: 08/14/2022 CLINICAL DATA:  Osteomyelitis, foot right heel and right forefoot osteomyelitis EXAM: MRI OF THE RIGHT FOREFOOT WITHOUT AND WITH CONTRAST; MRI OF THE RIGHT ANKLE WITHOUT AND WITH CONTRAST TECHNIQUE: Multiplanar, multisequence MR imaging of the right foot and ankle was performed before and after the administration of intravenous contrast. CONTRAST:  66mL GADAVIST GADOBUTROL 1 MMOL/ML IV SOLN COMPARISON:  Foot radiograph 08/13/2022. FINDINGS: MRI foot Bones/Joint/Cartilage There is marrow edema and confluent low T1 signal within the great toe medial sesamoid (axial T1 image 25). There is also marrow edema within the first metatarsal head and plantar aspect of the base of the proximal phalanx. There is associated marrow enhancement. There is a first MTP joint effusion with lobular heterogeneous fluid and thick synovial enhancement. No other significant marrow signal alteration in the forefoot. Ligaments Intact Lisfranc ligament.  Probable great toe plantar plate tear. Muscles and Tendons Diffuse muscle atrophy consistent with chronic denervation. Severe tendinosis of the flexor hallucis tendon. Soft tissues Diffuse soft tissue swelling of the foot. Medial forefoot soft tissue enhancement and swelling with plantar ulcer (axial postcontrast image 27). No evidence of soft tissue abscess. MRI  ankle TENDONS Peroneal: There is a complete tear of the peroneal brevis tendon at the lateral malleolus, with distal stump at the level of the lateral calcaneus and the proximal stump above the malleolus, tendon gap measuring approximately 5.7 cm, likely chronic. The peroneal longus tendon is intact. Minimal reactive peroneal tenosynovitis. Posteromedial: Intact tibialis posterior, flexor hallucis longus and flexor digitorum longus tendons. Anterior: Intact tibialis anterior, extensor hallucis longus and extensor digitorum longus tendons. Achilles: There is insertional Achilles tendinosis. No significant retrocalcaneal bursitis. Plantar Fascia: There is thickening of the central band plantar fascia compatible with chronic/prior plantar fasciitis. LIGAMENTS Lateral: Torn anterior talofibular ligament and calcaneofibular ligament. Posterior talofibular ligament intact. Anterior and posterior tibiofibular ligaments intact. Medial: Torn deep deltoid ligament.  Spring ligament intact. CARTILAGE Ankle Joint: Small tibiotalar joint effusion. Tibiotalar osteophyte formation with subchondral marrow edema and cystic change and high-grade cartilage loss with bony remodeling. Subtalar Joints/Sinus Tarsi: Mild-to-moderate posterior middle subtalar osteoarthritis. Loss of fat signal in the sinus tarsi. Moderate joint effusion. Bones: Hindfoot valgus with extra-articular subcortical marrow edema and cystic change in the lateral hindfoot with sub fibular soft tissue edema. No convincing marrow signal abnormality suggesting osteomyelitis. Plantar calcaneal spur. Soft Tissue: Extensive soft tissue swelling of the ankle and foot. Soft tissue enhancement in the plantar hindfoot with potential tiny sinus tract noted (coronal postcontrast images 20 2-23, within the subcutaneous tissues extending in close proximity to the central band plantar fascia. There is no evidence of a well-defined/drainable soft tissue abscess in the hindfoot.  Diffuse muscle atrophy consistent with chronic denervation. 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 Adjacent soft tissue cellulitis and plantar ulcer. Hindfoot valgus with findings of lateral hindfoot impingement. No convincing evidence of osteomyelitis in the hindfoot. Plantar heel soft tissue cellulitis with possible tiny sinus tract extending in close proximity to the central band plantar fascia. No evidence of a well-defined, drainable soft tissue abscess in the hindfoot. 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 Mild-to-moderate posterior middle subtalar osteoarthritis. Small tibiotalar and moderate posterior subtalar effusions.  Torn anterior talofibular ligament, calcaneofibular ligament, and deep deltoid ligament. Electronically Signed   By: Caprice Renshaw M.D.   On: 08/14/2022 08:32   MR ANKLE RIGHT W WO CONTRAST  Result Date: 08/14/2022 CLINICAL DATA:  Osteomyelitis, foot right heel and right forefoot osteomyelitis EXAM: MRI OF THE RIGHT FOREFOOT WITHOUT AND WITH CONTRAST; MRI OF THE RIGHT ANKLE WITHOUT AND WITH CONTRAST TECHNIQUE: Multiplanar, multisequence MR imaging of the right foot and ankle was performed before and after the administration of intravenous contrast. CONTRAST:  70mL GADAVIST GADOBUTROL 1 MMOL/ML IV SOLN COMPARISON:  Foot radiograph 08/13/2022. FINDINGS: MRI foot Bones/Joint/Cartilage There is marrow edema and confluent low T1 signal within the great toe medial sesamoid (axial T1 image 25). There is also marrow edema within the first metatarsal head and plantar aspect of the base of the proximal phalanx. There is associated marrow enhancement. There is a first MTP joint effusion with lobular heterogeneous fluid and thick synovial enhancement. No other significant marrow signal alteration in the  forefoot. Ligaments Intact Lisfranc ligament.  Probable great toe plantar plate tear. Muscles and Tendons Diffuse muscle atrophy consistent with chronic denervation. Severe tendinosis of the flexor hallucis tendon. Soft tissues Diffuse soft tissue swelling of the foot. Medial forefoot soft tissue enhancement and swelling with plantar ulcer (axial postcontrast image 27). No evidence of soft tissue abscess. MRI ankle TENDONS Peroneal: There is a complete tear of the peroneal brevis tendon at the lateral malleolus, with distal stump at the level of the lateral calcaneus and the proximal stump above the malleolus, tendon gap measuring approximately 5.7 cm, likely chronic. The peroneal longus tendon is intact. Minimal reactive peroneal tenosynovitis. Posteromedial: Intact tibialis posterior, flexor hallucis longus and flexor digitorum longus tendons. Anterior: Intact tibialis anterior, extensor hallucis longus and extensor digitorum longus tendons. Achilles: There is insertional Achilles tendinosis. No significant retrocalcaneal bursitis. Plantar Fascia: There is thickening of the central band plantar fascia compatible with chronic/prior plantar fasciitis. LIGAMENTS Lateral: Torn anterior talofibular ligament and calcaneofibular ligament. Posterior talofibular ligament intact. Anterior and posterior tibiofibular ligaments intact. Medial: Torn deep deltoid ligament.  Spring ligament intact. CARTILAGE Ankle Joint: Small tibiotalar joint effusion. Tibiotalar osteophyte formation with subchondral marrow edema and cystic change and high-grade cartilage loss with bony remodeling. Subtalar Joints/Sinus Tarsi: Mild-to-moderate posterior middle subtalar osteoarthritis. Loss of fat signal in the sinus tarsi. Moderate joint effusion. Bones: Hindfoot valgus with extra-articular subcortical marrow edema and cystic change in the lateral hindfoot with sub fibular soft tissue edema. No convincing marrow signal abnormality suggesting  osteomyelitis. Plantar calcaneal spur. Soft Tissue: Extensive soft tissue swelling of the ankle and foot. Soft tissue enhancement in the plantar hindfoot with potential tiny sinus tract noted (coronal postcontrast images 20 2-23, within the subcutaneous tissues extending in close proximity to the central band plantar fascia. There is no evidence of a well-defined/drainable soft tissue abscess in the hindfoot. Diffuse muscle atrophy consistent with chronic denervation. 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 Adjacent soft tissue cellulitis and plantar ulcer. Hindfoot valgus with findings of lateral hindfoot impingement. No convincing evidence of osteomyelitis in the hindfoot. Plantar heel soft tissue cellulitis with possible tiny sinus tract extending in close proximity to the central band plantar fascia. No evidence of a well-defined, drainable soft tissue abscess in the hindfoot. 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 Mild-to-moderate posterior middle subtalar osteoarthritis. Small  tibiotalar and moderate posterior subtalar effusions. Torn anterior talofibular ligament, calcaneofibular ligament, and deep deltoid ligament. Electronically Signed   By: Maurine Simmering M.D.   On: 08/14/2022 08:32   DG Skull 1-3 Views  Result Date: 08/13/2022 CLINICAL DATA:  Osteomyelitis.  Metal foreign body screen. EXAM: SKULL - 1-3 VIEW COMPARISON:  None Available. FINDINGS: There is no evidence of skull fracture or other focal bone lesions. Dental hardware noted. No unexpected intra orbital metallic foreign body IMPRESSION: 1. No unexpected intra orbital metallic foreign body. . Electronically Signed   By: Fidela Salisbury M.D.   On: 08/13/2022 23:28   DG Abd Portable 1V  Result Date: 08/13/2022 CLINICAL DATA:  Osteomyelitis.  Metal  screening for MRI. EXAM: PORTABLE ABDOMEN - 1 VIEW COMPARISON:  None Available. FINDINGS: Normal abdominal gas pattern. No gross free intraperitoneal gas. Surgical clips and surgical skin staples are noted within the lower quadrant the abdomen bilaterally. Right total hip arthroplasty has been performed. No acute bone abnormality. IMPRESSION: 1. Normal abdominal gas pattern. No gross free intraperitoneal gas. Surgical clips and skin staples possibly relate to bilateral inguinal hernia repair. No unexpected metallic foreign body within the abdomen. Electronically Signed   By: Fidela Salisbury M.D.   On: 08/13/2022 23:27   DG CHEST PORT 1 VIEW  Result Date: 08/13/2022 CLINICAL DATA:  Metal screening, osteomyelitis EXAM: PORTABLE CHEST 1 VIEW COMPARISON:  None Available. FINDINGS: Lungs are clear. No pneumothorax or pleural effusion. Cardiac size within normal limits. Pulmonary vascularity is normal. No unexpected metallic foreign body noted within the thorax. IMPRESSION: 1. No active disease. No unexpected metallic foreign body. Electronically Signed   By: Fidela Salisbury M.D.   On: 08/13/2022 23:26   DG Foot Complete Right  Result Date: 08/13/2022 CLINICAL DATA:  Plantar ulcers at heel and first metatarsal EXAM: RIGHT FOOT COMPLETE - 3+ VIEW COMPARISON:  1124 FINDINGS: Osseous demineralization. Advanced degenerative changes of the ankle joint. Remaining joint spaces preserved. Questionable loss of cortical margination at the plantar aspect of the posterior calcaneus, concerning for subtle osteomyelitis. No acute fracture, dislocation, or additional bone destruction. Specifically, no definite bone destruction is seen at the plantar aspect of the first MTP joint at site of ulcer. IMPRESSION: Question subtle osteomyelitis of the plantar surface of the posterior calcaneus, similar to prior exam; this could be better assessed by MR. Advanced degenerative changes at ankle. Electronically Signed   By: Lavonia Dana M.D.    On: 08/13/2022 20:43    Microbiology: Results for orders placed or performed during the hospital encounter of 08/13/22  Aerobic Culture w Gram Stain (superficial specimen)     Status: None   Collection Time: 08/14/22  4:02 PM   Specimen: Wound  Result Value Ref Range Status   Specimen Description   Final    WOUND Performed at [facility] [address] [place], [place] [address]    Special Requests   Final    NONE Performed at [facility] [place] [address], [place], [place] [address]    Gram Stain NO WBC SEEN NO ORGANISMS SEEN   Final   Culture   Final    NO GROWTH 3 DAYS Performed at [facility], [place] [address] [place], [place] [address]    Report Status 08/17/2022 FINAL  Final  Aerobic/Anaerobic Culture w Gram Stain (surgical/deep wound)     Status: None   Collection Time: 08/16/22  4:35 PM   Specimen: PATH Other; Body Fluid  Result Value Ref Range Status   Specimen Description   Final    WOUND RIGHT FOOT JOINT Performed at [facility] [address], [place], [place] [address]    Special Requests   Final    NONE Performed at [facility], [place] [address], [place], [place] [address]    Gram Stain   Final    RARE WBC PRESENT,BOTH PMN AND MONONUCLEAR NO ORGANISMS SEEN    Culture   Final    No growth aerobically or anaerobically. Performed at [facility], [place] [address], [place], [place] [address]    Report Status 08/21/2022 FINAL  Final    Labs: CBC: Recent Labs  Lab 08/19/22 0352 08/20/22 0333 08/21/22 0406 08/22/22 0408 08/23/22 0359  WBC 7.6 7.1 6.8 7.3 7.3  NEUTROABS 4.9 4.7 4.5 4.6 4.4  HGB 12.4* 11.8* 12.3* 13.3 11.9*  HCT 38.8* 36.3* 38.3* 40.9 36.8*  MCV 103.7* 103.1* 104.4* 103.8* 104.8*  PLT 314 335 322 352 735   Basic Metabolic Panel: Recent Labs  Lab 08/17/22 0345 08/18/22 0432 08/19/22 0352 08/20/22 0333 08/21/22 0406  NA 138 137 137 135  132*  K 4.2 3.9 3.9 4.4 4.1  CL 106 107 103 102 102  CO2 24 24 25 24 23   GLUCOSE 82 88 130* 83 98  BUN 24* 26* 28* 24* 24*  CREATININE 1.24 1.31* 1.45* 1.34* 1.20  CALCIUM 8.3* 8.1* 8.3* 8.1* 8.4*  MG 2.1 2.3 2.0 2.1  --   PHOS 2.6 2.7 2.4* 3.7  --    Liver Function Tests: Recent Labs  Lab 08/17/22 0345 08/18/22 0432 08/19/22 0352  AST 18 19 24   ALT 15 15 16   ALKPHOS 28* 33* 31*  BILITOT 1.1 0.6 0.8  PROT 5.3* 5.9* 5.6*  ALBUMIN 2.8* 2.9* 3.0*   Discharge time spent:  35 minutes.  Signed: Cordelia Poche, MD [facility] 08/23/2022

## 2022-08-23 NOTE — TOC Progression Note (Signed)
Transition of Care [facility]) - Progression Note    Patient Details  Name: Drelyn Pistilli MRN: [id number] Date of Birth: 1930/02/04  Transition of Care [facility]) CM/SW Contact  Lennart Pall, LCSW Phone Number: 08/23/2022, 10:13 AM  Clinical Narrative:    Have received insurance authorization for [facility] SNF.  MD alerted and planning dc today.   Expected Discharge Plan: [place] Barriers to Discharge: SNF Pending bed offer, Continued Medical Work up  Expected Discharge Plan and Services In-house Referral: Clinical Social Work Discharge Planning Services: CM Consult Post Acute Care Choice: [place] Living arrangements for the past 2 months: Single Family Home                 DME Arranged: N/A DME Agency: NA                   Social Determinants of Health (SDOH) Interventions SDOH Screenings   Food Insecurity: No Food Insecurity (08/14/2022)  Housing: Low Risk  (08/14/2022)  Transportation Needs: No Transportation Needs (08/14/2022)  Utilities: Not At Risk (08/14/2022)  Tobacco Use: Low Risk  (08/17/2022)    Readmission Risk Interventions    08/16/2022    3:47 PM  Readmission Risk Prevention Plan  Transportation Screening Complete  PCP or Specialist Appt within 5-7 Days Complete  Home Care Screening Complete  Medication Review (RN CM) Complete

## 2022-08-23 NOTE — Progress Notes (Signed)
Report called to [facility] at [facility]. PTAR at bedside to transport pt to facility. IV discontinued. Belongings were packed and sent with pt. Discharge packet given to transport.

## 2022-08-23 NOTE — TOC Transition Note (Signed)
Transition of Care [facility]) - CM/SW Discharge Note   Patient Details  Name: Hector Vazquez MRN: [id number] Date of Birth: [birth year]  Transition of Care [facility]) CM/SW Contact:  Lennart Pall, LCSW Phone Number: 08/23/2022, 1:05 PM   Clinical Narrative:     Have received insurance authorization for admission today to [facility] SNF.  Pt is medically cleared for dc.  Pt and HC POA aware and agreeable.  PTAR called and RN to call report to [phone].  No further TOC needs.  Final next level of care: Skilled Nursing Facility Barriers to Discharge: Barriers Resolved   Patient Goals and CMS Choice CMS Medicare.gov Compare Post Acute Care list provided to:: Patient Choice offered to / list presented to : Patient  Discharge Placement                Patient chooses bed at: Other - please specify in the comment section below: [facility] SNF) Patient to be transferred to facility by: [place] Name of family member notified: [facility] POA, Abel Presto Patient and family notified of of transfer: 08/23/22  Discharge Plan and Services Additional resources added to the After Visit Summary for   In-house Referral: Clinical Social Work Discharge Planning Services: CM Consult Post Acute Care Choice: [place]          DME Arranged: N/A DME Agency: NA                  Social Determinants of Health (SDOH) Interventions SDOH Screenings   Food Insecurity: No Food Insecurity (08/14/2022)  Housing: Low Risk  (08/14/2022)  Transportation Needs: No Transportation Needs (08/14/2022)  Utilities: Not At Risk (08/14/2022)  Tobacco Use: Low Risk  (08/17/2022)     Readmission Risk Interventions    08/16/2022    3:47 PM  Readmission Risk Prevention Plan  Transportation Screening Complete  PCP or Specialist Appt within 5-7 Days Complete  Home Care Screening Complete  Medication Review (RN CM) Complete

## 2022-08-23 NOTE — Care Management Important Message (Signed)
Important Message  Patient Details IM Letter placed in room. Name: Hector Vazquez MRN: [id number] Date of Birth: 10-14-1929   Medicare Important Message Given:  Yes     Kerin Salen 08/23/2022, 1:25 PM

## 2022-08-23 NOTE — Plan of Care (Signed)

## 2022-08-29 ENCOUNTER — Ambulatory Visit (INDEPENDENT_AMBULATORY_CARE_PROVIDER_SITE_OTHER): Payer: Medicare HMO | Admitting: Internal Medicine

## 2022-08-29 ENCOUNTER — Other Ambulatory Visit: Payer: Self-pay

## 2022-08-29 VITALS — BP 108/73 | HR 120 | Temp 97.8°F

## 2022-08-29 DIAGNOSIS — M869 Osteomyelitis, unspecified: Secondary | ICD-10-CM

## 2022-08-29 NOTE — Progress Notes (Unsigned)
Patient: Hector Vazquez  DOB: May 23, 1930 MRN: [id number] PCP: Garwin Brothers, MD   Patient Active Problem List   Diagnosis Date Noted   Septic arthritis of foot ([place]) 08/22/2022   Pressure injury of skin of right heel 08/22/2022   Ulcer of right heel and midfoot with fat layer exposed ([place]) 08/17/2022   Osteomyelitis ([place]) 08/15/2022   Subacute osteomyelitis of right foot ([place]) 08/13/2022   PAF (paroxysmal atrial fibrillation) ([place]) 08/13/2022   Impaired ambulation 06/20/2022   Heart failure with reduced ejection fraction and diastolic dysfunction ([place]) 02/15/2020   Essential hypertension 07/08/2015   Stage 3a chronic kidney disease (CKD) ([place]) - baseline SCr 1.6 07/06/2014   BPH (benign prostatic hyperplasia) 04/05/2014   Crohn's disease ([place]) 02/23/2014   Unspecified hearing loss, unspecified ear 02/23/2014     Subjective:  Hector Vazquez is a 89 y.o. male with PAF not on anticoagulation due to fall risk, CKD stage III presented for admission for right foot osteomyelitis.  Patient presented with stable vitals no leukocytosis.  MRI right foot showed septic arthritis first MTP joint and osteomyelitis, adjacent soft tissue cellulitis of plantar heels, soft tissue cellulitis with possible tiny sinus tract distending close proximity to the central band of plantar fascia.  He was seen by wound care at [place], on 08/02/2022 noted at the MTP wound had blistered and cultures of tendon that were taken grew Pseudomonas.  Patient placed on ciprofloxacin, noted prior to Cipro he had been on Doxy.  Taken to the OR with podiatry for debridement of right foot MTP with cultures no growth, plantar heel was debrided as well with skin substitute stapled.  Discharged on ciprofloxacin x6 weeks from the OR EOT 3/8. Today, patient reports he has been taking ciprofloxacin.  His friend accompanies him to the visit.  Tolerating antibiotics without any issues.  He is now residing at a skilled facility. Review of Systems   All other systems reviewed and are negative.   Past Medical History:  Diagnosis Date   Coronary artery disease     Outpatient Medications Prior to Visit  Medication Sig Dispense Refill   b complex vitamins capsule Take 1 capsule by mouth daily.     Cholecalciferol (VITAMIN D3) 25 MCG (1000 UT) CAPS Take 1,000 Units by mouth daily.     ciprofloxacin (CIPRO) 500 MG tablet Take 1 tablet (500 mg total) by mouth 2 (two) times daily. Take for 36 days from date of prescription     lovastatin (MEVACOR) 20 MG tablet Take 20 mg by mouth at bedtime.     magnesium oxide (MAG-OX) 400 (240 Mg) MG tablet Take 400 mg by mouth daily.     metoprolol succinate (TOPROL-XL) 25 MG 24 hr tablet Take 0.5 tablets (12.5 mg total) by mouth daily.     polyethylene glycol (MIRALAX / GLYCOLAX) 17 g packet Take 17 g by mouth daily as needed for moderate constipation. 14 each 0   torsemide (DEMADEX) 20 MG tablet Take 20 mg by mouth daily.     traMADol (ULTRAM) 50 MG tablet Take 1 tablet (50 mg total) by mouth every 8 (eight) hours as needed for moderate pain or severe pain. 20 tablet 0   No facility-administered medications prior to visit.     No Known Allergies  Social History   Tobacco Use   Smoking status: Never   Smokeless tobacco: Never  Vaping Use   Vaping Use: Never used  Substance Use Topics   Alcohol use: Never  Drug use: Never    No family history on file.  Objective:  There were no vitals filed for this visit. There is no height or weight on file to calculate BMI.  Physical Exam Constitutional:      General: He is not in acute distress.    Appearance: He is normal weight. He is not toxic-appearing.  HENT:     Head: Normocephalic and atraumatic.     Right Ear: External ear normal.     Left Ear: External ear normal.     Nose: No congestion or rhinorrhea.     Mouth/Throat:     Mouth: Mucous membranes are moist.     Pharynx: Oropharynx is clear.  Eyes:     Extraocular Movements:  Extraocular movements intact.     Conjunctiva/sclera: Conjunctivae normal.     Pupils: Pupils are equal, round, and reactive to light.  Cardiovascular:     Rate and Rhythm: Normal rate and regular rhythm.     Heart sounds: No murmur heard.    No friction rub. No gallop.  Pulmonary:     Effort: Pulmonary effort is normal.     Breath sounds: Normal breath sounds.  Abdominal:     General: Abdomen is flat. Bowel sounds are normal.     Palpations: Abdomen is soft.  Musculoskeletal:        General: No swelling. Normal range of motion.     Cervical back: Normal range of motion and neck supple.  Skin:    General: Skin is warm and dry.  Neurological:     General: No focal deficit present.     Mental Status: He is oriented to person, place, and time.  Psychiatric:        Mood and Affect: Mood normal.        Lab Results: Lab Results  Component Value Date   WBC 7.3 08/23/2022   HGB 11.9 (L) 08/23/2022   HCT 36.8 (L) 08/23/2022   MCV 104.8 (H) 08/23/2022   PLT 350 08/23/2022    Lab Results  Component Value Date   CREATININE 1.20 08/21/2022   BUN 24 (H) 08/21/2022   NA 132 (L) 08/21/2022   K 4.1 08/21/2022   CL 102 08/21/2022   CO2 23 08/21/2022    Lab Results  Component Value Date   ALT 16 08/19/2022   AST 24 08/19/2022   ALKPHOS 31 (L) 08/19/2022   BILITOT 0.8 08/19/2022     Assessment & Plan:  #Osteomyelitis right foot #Neuropathic ulceration right heel SP debridement and skin substitute staples -Per OR note, arthrotomy of MTPJ performed and purulent drainage emanated. OR Cx with NG -Pt discharged on cipro x 6 weeks EOT 3/8(6 weeks from OR on 08/16/22) Plan: -Continue ciprofloxacin till next visit -Labs today -EKG today qtc 315 -Podiatry on 09/03/22 -Follow-up with ID on 3/11   Laurice Record, [place] for [facility]   08/29/22  3:30 PM   I have personally spent 50 minutes involved in face-to-face and  non-face-to-face activities for this patient on the day of the visit. Professional time spent includes the following activities: Preparing to see the patient (review of tests), Obtaining and/or reviewing separately obtained history (admission/discharge record), Performing a medically appropriate examination and/or evaluation , Ordering medications/tests/procedures, referring and communicating with other health care professionals, Documenting clinical information in the EMR, Independently interpreting results (not separately reported), Communicating results to the patient/family/caregiver, Counseling and educating the patient/family/caregiver and Care coordination (not separately  reported).

## 2022-08-30 LAB — CBC WITH DIFFERENTIAL/PLATELET
Absolute Monocytes: 930 cells/uL (ref 200–950)
Basophils Absolute: 62 cells/uL (ref 0–200)
Basophils Relative: 0.5 %
Eosinophils Absolute: 136 cells/uL (ref 15–500)
Eosinophils Relative: 1.1 %
HCT: 40.3 % (ref 38.5–50.0)
Hemoglobin: 13.6 g/dL (ref 13.2–17.1)
Lymphs Abs: 1215 cells/uL (ref 850–3900)
MCH: 33.2 pg — ABNORMAL HIGH (ref 27.0–33.0)
MCHC: 33.7 g/dL (ref 32.0–36.0)
MCV: 98.3 fL (ref 80.0–100.0)
MPV: 9.3 fL (ref 7.5–12.5)
Monocytes Relative: 7.5 %
Neutro Abs: 10056 cells/uL — ABNORMAL HIGH (ref 1500–7800)
Neutrophils Relative %: 81.1 %
Platelets: 301 10*3/uL (ref 140–400)
RBC: 4.1 10*6/uL — ABNORMAL LOW (ref 4.20–5.80)
RDW: 12.7 % (ref 11.0–15.0)
Total Lymphocyte: 9.8 %
WBC: 12.4 10*3/uL — ABNORMAL HIGH (ref 3.8–10.8)

## 2022-08-30 LAB — COMPLETE METABOLIC PANEL WITH GFR
AG Ratio: 1.2 (calc) (ref 1.0–2.5)
ALT: 48 U/L — ABNORMAL HIGH (ref 9–46)
AST: 64 U/L — ABNORMAL HIGH (ref 10–35)
Albumin: 3.7 g/dL (ref 3.6–5.1)
Alkaline phosphatase (APISO): 81 U/L (ref 35–144)
BUN/Creatinine Ratio: 22 (calc) (ref 6–22)
BUN: 37 mg/dL — ABNORMAL HIGH (ref 7–25)
CO2: 30 mmol/L (ref 20–32)
Calcium: 8.7 mg/dL (ref 8.6–10.3)
Chloride: 98 mmol/L (ref 98–110)
Creat: 1.66 mg/dL — ABNORMAL HIGH (ref 0.70–1.22)
Globulin: 3.2 g/dL (calc) (ref 1.9–3.7)
Glucose, Bld: 96 mg/dL (ref 65–99)
Potassium: 3.9 mmol/L (ref 3.5–5.3)
Sodium: 139 mmol/L (ref 135–146)
Total Bilirubin: 1 mg/dL (ref 0.2–1.2)
Total Protein: 6.9 g/dL (ref 6.1–8.1)
eGFR: 39 mL/min/{1.73_m2} — ABNORMAL LOW (ref >=60)

## 2022-08-30 LAB — C-REACTIVE PROTEIN: CRP: 207.2 mg/L — ABNORMAL HIGH (ref <8.0)

## 2022-08-30 LAB — SEDIMENTATION RATE: Sed Rate: 58 mm/h — ABNORMAL HIGH (ref 0–20)

## 2022-09-03 ENCOUNTER — Ambulatory Visit: Payer: Medicare HMO | Admitting: Podiatry

## 2022-09-03 DIAGNOSIS — L97512 Non-pressure chronic ulcer of other part of right foot with fat layer exposed: Secondary | ICD-10-CM

## 2022-09-03 DIAGNOSIS — M86271 Subacute osteomyelitis, right ankle and foot: Secondary | ICD-10-CM

## 2022-09-03 NOTE — Patient Instructions (Signed)
Dressing should be changed 3x weekly with xeroform and gauze dressings  May begin 50% body weightbearing on R foot in surgical shoe (will need walker or cane)

## 2022-09-04 ENCOUNTER — Telehealth: Payer: Self-pay | Admitting: Internal Medicine

## 2022-09-04 DIAGNOSIS — M869 Osteomyelitis, unspecified: Secondary | ICD-10-CM

## 2022-09-04 NOTE — Telephone Encounter (Signed)
His wbc, esr, and crp are elevated. This could be due to recent surgery. I don't see him till 3/11. He has a podiatry appt that day. It would work well if he could stop by labs that day for repeat labs

## 2022-09-04 NOTE — Telephone Encounter (Signed)
I spoke to Debo, RN with [facility] [phone] and gave verbal orders for the patient to have labs drawn (CBC w/diff, CRP and ESR). Debo verbalized understanding. Patient friend Gus Height listed on the patient's chart stated it would be easier to have the facility draw the labs. I have also provided Debo with a fax number to fax the results.  Anjali Manzella T Brooks Sailors

## 2022-09-06 ENCOUNTER — Encounter: Payer: Self-pay | Admitting: Podiatry

## 2022-09-06 NOTE — Progress Notes (Signed)
  Subjective:  Patient ID: Hector Vazquez, male    DOB: August 14, 1929,  MRN: [id number]  Chief Complaint  Patient presents with   Follow-up    Patient is here today for his right foot follow up. Patient states that his right foot has been very sore all morning and has been taking tylenol for the pain and that has been helping with the pains. Patient has also been taking the Tramadol medication as well.     DOS: 08/16/2022 Procedure: Debridement of right heel ulcer and excision of sesamoid  89 y.o. male returns for post-op check.  He states he is doing well his not having much pain they have been changing the dressing at the facility he is at  Review of Systems: Negative except as noted in the HPI. Denies N/V/F/Ch.   Objective:   Vitals:   There is no height or weight on file to calculate BMI. Constitutional Well developed. Well nourished.  Vascular Foot warm and well perfused. Capillary refill normal to all digits.  Calf is soft and supple, no posterior calf or knee pain, negative Homans' sign  Neurologic Normal speech. Oriented to person, place, and time. Epicritic sensation to light touch grossly reduced bilaterally.  Dermatologic Skin healing well without signs of infection. Skin edges well coapted without signs of infection.  Good integration of graft at heel ulcer  Orthopedic: Tenderness to palpation noted about the surgical site.    Assessment:   1. Subacute osteomyelitis of right foot ([place])   2. Ulcer of right foot with fat layer exposed ([place])    Plan:  Patient was evaluated and treated and all questions answered.  S/p foot surgery right -Progressing as expected post-operatively. -WB Status: Can begin 50% body weightbearing in surgical shoe  -Sutures: Return in 2 weeks for removal -Foot redressed.  Continue 3 times weekly dressing changes with Xeroform  Return in about 2 weeks (around 09/17/2022) for wound care, suture /staple removal.

## 2022-09-18 ENCOUNTER — Ambulatory Visit: Payer: Medicare HMO | Admitting: Podiatry

## 2022-09-25 ENCOUNTER — Ambulatory Visit: Payer: Medicare HMO | Admitting: Podiatry

## 2022-09-25 DIAGNOSIS — L97512 Non-pressure chronic ulcer of other part of right foot with fat layer exposed: Secondary | ICD-10-CM

## 2022-09-25 DIAGNOSIS — M86271 Subacute osteomyelitis, right ankle and foot: Secondary | ICD-10-CM

## 2022-09-25 NOTE — Patient Instructions (Signed)
Physical therapy: may be full weightbearing as tolerated in new post op shoe (shoe has special offloading insert for heel)  Wound care: change every other day with antibiotic ointment (e.g. neosporin or bacitracin) to heel and along forefoot incision. Dress with silver alginate (maxorb or aquacell or equivalent) to heel, then gauze, gauze roll and tape  Continue to offload foot and heel in bed and while resting with pillow under calf or offloading boots to prevent pressure ulcers

## 2022-09-25 NOTE — Progress Notes (Signed)
  Subjective:  Patient ID: Hector Vazquez, male    DOB: 23-Jul-1930,  MRN: [id number]  Chief Complaint  Patient presents with   Foot Ulcer    wound care, suture /staple removal.    DOS: 08/16/2022 Procedure: Debridement of right heel ulcer and excision of sesamoid  89 y.o. male returns for post-op check.    Review of Systems: Negative except as noted in the HPI. Denies N/V/F/Ch.   Objective:   There were no vitals filed for this visit.  There is no height or weight on file to calculate BMI. Constitutional Well developed. Well nourished.  Vascular Foot warm and well perfused. Capillary refill normal to all digits.  Calf is soft and supple, no posterior calf or knee pain, negative Homans' sign  Neurologic Normal speech. Oriented to person, place, and time. Epicritic sensation to light touch grossly reduced bilaterally.  Dermatologic Doing much better the plantar sesamoid incision is fully healed now.  Minimal hypertrophy.  Ulceration has improved quite a bit.  Measures 1.0 x 0.6 x 0.4 cm  Orthopedic: Tenderness to palpation noted about the surgical site.    Assessment:   1. Subacute osteomyelitis of right foot ([place])   2. Ulcer of right foot with fat layer exposed ([place])    Plan:  Patient was evaluated and treated and all questions answered.  S/p foot surgery right -Overall doing much better.  Surgical shoe and peg assist device to offload the ulceration has healed so he may begin full weightbearing improved mobility and function.  Continue local wound care, apply silver alginate to the plantar ulcer, Neosporin or mupirocin ointment to the incision and dressed with dry sterile dressings.  Continue to offload while in bed.  I will see him back in 3 weeks for follow-up wound care.  Return in about 3 weeks (around 10/16/2022) for wound care.

## 2022-10-01 ENCOUNTER — Other Ambulatory Visit: Payer: Self-pay

## 2022-10-01 ENCOUNTER — Ambulatory Visit (INDEPENDENT_AMBULATORY_CARE_PROVIDER_SITE_OTHER): Payer: Medicare HMO | Admitting: Internal Medicine

## 2022-10-01 VITALS — BP 121/78 | HR 110 | Temp 98.0°F | Resp 16

## 2022-10-01 DIAGNOSIS — M869 Osteomyelitis, unspecified: Secondary | ICD-10-CM

## 2022-10-01 NOTE — Progress Notes (Unsigned)
Patient Active Problem List   Diagnosis Date Noted   Septic arthritis of foot ([place]) 08/22/2022   Pressure injury of skin of right heel 08/22/2022   Ulcer of right heel and midfoot with fat layer exposed ([place]) 08/17/2022   Osteomyelitis ([place]) 08/15/2022   Subacute osteomyelitis of right foot ([place]) 08/13/2022   PAF (paroxysmal atrial fibrillation) ([place]) 08/13/2022   Impaired ambulation 06/20/2022   Heart failure with reduced ejection fraction and diastolic dysfunction ([place]) 02/15/2020   Essential hypertension 07/08/2015   Stage 3a chronic kidney disease (CKD) ([place]) - baseline SCr 1.6 07/06/2014   BPH (benign prostatic hyperplasia) 04/05/2014   Crohn's disease ([place]) 02/23/2014   Unspecified hearing loss, unspecified ear 02/23/2014    Patient's Medications  New Prescriptions   No medications on file  Previous Medications   ACETAMINOPHEN (TYLENOL) 325 MG TABLET    Take by mouth.   APIXABAN (ELIQUIS) 5 MG TABS TABLET    Take by mouth.   B COMPLEX VITAMINS CAPSULE    Take 1 capsule by mouth daily.   CALMOSEPTINE 0.44-20.6 % OINT    Apply topically.   CARVEDILOL (COREG) 3.125 MG TABLET    Take by mouth.   CHOLECALCIFEROL (VITAMIN D3) 25 MCG (1000 UT) CAPS    Take 1,000 Units by mouth daily.   LOVASTATIN (MEVACOR) 20 MG TABLET    Take 20 mg by mouth at bedtime.   MAGNESIUM OXIDE (MAG-OX) 400 (240 MG) MG TABLET    Take 400 mg by mouth daily.   POLYETHYLENE GLYCOL (MIRALAX / GLYCOLAX) 17 G PACKET    Take 17 g by mouth daily as needed for moderate constipation.   TORSEMIDE (DEMADEX) 20 MG TABLET    Take 20 mg by mouth daily.   TRAMADOL (ULTRAM) 50 MG TABLET    Take 1 tablet (50 mg total) by mouth every 8 (eight) hours as needed for moderate pain or severe pain.  Modified Medications   No medications on file  Discontinued Medications   METOPROLOL SUCCINATE (TOPROL-XL) 25 MG 24 HR TABLET    Take 0.5 tablets (12.5 mg total) by mouth daily.    Subjective: Hector Vazquez is a 89 y.o. male  with PAF not on anticoagulation due to fall risk, CKD stage III presented for admission for right foot osteomyelitis.  Patient presented with stable vitals no leukocytosis.  MRI right foot showed septic arthritis first MTP joint and osteomyelitis, adjacent soft tissue cellulitis of plantar heels, soft tissue cellulitis with possible tiny sinus tract distending close proximity to the central band of plantar fascia.  He was seen by wound care at [facility], on 08/02/2022 noted at the MTP wound had blistered and cultures of tendon that were taken grew Pseudomonas.  Patient placed on ciprofloxacin, noted prior to Cipro he had been on Doxy.  Taken to the OR with podiatry for debridement of right foot MTP with cultures no growth, plantar heel was debrided as well with skin substitute stapled.  Discharged on ciprofloxacin x6 weeks from the OR EOT 3/8.  08/29/22: patient reports he has been taking ciprofloxacin.  His friend accompanies him to the visit.  Tolerating antibiotics without any issues.  He is now residing at a skilled facility.  Today 10/01/22: Pt presents alone. Denies fevers and chills. Some general foot pain, overall improved.  Review of Systems: Review of Systems  All other systems reviewed and are negative.   Past Medical History:  Diagnosis Date   Coronary artery disease  Social History   Tobacco Use   Smoking status: Never   Smokeless tobacco: Never  Vaping Use   Vaping Use: Never used  Substance Use Topics   Alcohol use: Never   Drug use: Never    No family history on file.  No Known Allergies  Health Maintenance  Topic Date Due   DTaP/Tdap/Td (1 - Tdap) Never done   Pneumonia Vaccine 41+ Years old (1 of 1 - PCV) Never done   Zoster Vaccines- Shingrix (2 of 2) 11/18/2020   COVID-19 Vaccine (5 - 2023-24 season) 06/05/2022   Medicare Annual Wellness (AWV)  09/23/2022   INFLUENZA VACCINE  Completed   HPV VACCINES  Aged Out    Objective:  Vitals:   10/01/22 1501  BP:  121/78  Pulse: (!) 110  Resp: 16  Temp: 98 F (36.7 C)  TempSrc: Oral  SpO2: 98%   There is no height or weight on file to calculate BMI.  Physical Exam Constitutional:      General: He is not in acute distress.    Appearance: He is normal weight. He is not toxic-appearing.  HENT:     Head: Normocephalic and atraumatic.     Right Ear: External ear normal.     Left Ear: External ear normal.     Nose: No congestion or rhinorrhea.     Mouth/Throat:     Mouth: Mucous membranes are moist.     Pharynx: Oropharynx is clear.  Eyes:     Extraocular Movements: Extraocular movements intact.     Conjunctiva/sclera: Conjunctivae normal.     Pupils: Pupils are equal, round, and reactive to light.  Cardiovascular:     Rate and Rhythm: Normal rate and regular rhythm.     Heart sounds: No murmur heard.    No friction rub. No gallop.  Pulmonary:     Effort: Pulmonary effort is normal.     Breath sounds: Normal breath sounds.  Abdominal:     General: Abdomen is flat. Bowel sounds are normal.     Palpations: Abdomen is soft.  Musculoskeletal:        General: No swelling.     Cervical back: Normal range of motion and neck supple.  Skin:    General: Skin is warm and dry.  Neurological:     General: No focal deficit present.     Mental Status: He is oriented to person, place, and time.  Psychiatric:        Mood and Affect: Mood normal.     Lab Results Lab Results  Component Value Date   WBC 12.4 (H) 08/29/2022   HGB 13.6 08/29/2022   HCT 40.3 08/29/2022   MCV 98.3 08/29/2022   PLT 301 08/29/2022    Lab Results  Component Value Date   CREATININE 1.66 (H) 08/29/2022   BUN 37 (H) 08/29/2022   NA 139 08/29/2022   K 3.9 08/29/2022   CL 98 08/29/2022   CO2 30 08/29/2022    Lab Results  Component Value Date   ALT 48 (H) 08/29/2022   AST 64 (H) 08/29/2022   ALKPHOS 31 (L) 08/19/2022   BILITOT 1.0 08/29/2022    No results found for: "CHOL", "HDL", "LDLCALC", "LDLDIRECT",  "TRIG", "CHOLHDL" No results found for: "LABRPR", "RPRTITER" No results found for: "HIV1RNAQUANT", "HIV1RNAVL", "CD4TABS"   Problem List Items Addressed This Visit   None  Assessment/Plan #Osteomyelitis right foot #Neuropathic ulceration right heel SP debridement and skin substitute staples  #Medication monitoring -Per OR  note, arthrotomy of MTPJ performed and purulent drainage emanated. OR Cx with NG -Pt discharged on cipro x 6 weeks EOT 3/8(6 weeks from OR on 08/16/22) which he has completed. On exam wound  appears to healing, small ulceration which appears to be healing and seem ssuperficial.  -Followed by Dr. Sherryle Lis Podiatry, on 3/5 noted  overall pt, doing much better, Recc wound care for ulceration.  Plan: -Labs today  -Follow-up in one month off of antibiotics   Laurice Record, MD [place] [facility] 10/01/2022, 3:18 PM

## 2022-10-02 LAB — CBC WITH DIFFERENTIAL/PLATELET
Absolute Monocytes: 537 cells/uL (ref 200–950)
Basophils Absolute: 73 cells/uL (ref 0–200)
Basophils Relative: 0.8 %
Eosinophils Absolute: 218 cells/uL (ref 15–500)
Eosinophils Relative: 2.4 %
HCT: 36 % — ABNORMAL LOW (ref 38.5–50.0)
Hemoglobin: 11.6 g/dL — ABNORMAL LOW (ref 13.2–17.1)
Lymphs Abs: 1620 cells/uL (ref 850–3900)
MCH: 32.4 pg (ref 27.0–33.0)
MCHC: 32.2 g/dL (ref 32.0–36.0)
MCV: 100.6 fL — ABNORMAL HIGH (ref 80.0–100.0)
MPV: 8.6 fL (ref 7.5–12.5)
Monocytes Relative: 5.9 %
Neutro Abs: 6652 cells/uL (ref 1500–7800)
Neutrophils Relative %: 73.1 %
Platelets: 449 10*3/uL — ABNORMAL HIGH (ref 140–400)
RBC: 3.58 10*6/uL — ABNORMAL LOW (ref 4.20–5.80)
RDW: 13.4 % (ref 11.0–15.0)
Total Lymphocyte: 17.8 %
WBC: 9.1 10*3/uL (ref 3.8–10.8)

## 2022-10-02 LAB — SEDIMENTATION RATE: Sed Rate: 51 mm/h — ABNORMAL HIGH (ref 0–20)

## 2022-10-02 LAB — COMPLETE METABOLIC PANEL WITH GFR
AG Ratio: 1.1 (calc) (ref 1.0–2.5)
ALT: 8 U/L — ABNORMAL LOW (ref 9–46)
AST: 13 U/L (ref 10–35)
Albumin: 3.2 g/dL — ABNORMAL LOW (ref 3.6–5.1)
Alkaline phosphatase (APISO): 47 U/L (ref 35–144)
BUN/Creatinine Ratio: 18 (calc) (ref 6–22)
BUN: 28 mg/dL — ABNORMAL HIGH (ref 7–25)
CO2: 27 mmol/L (ref 20–32)
Calcium: 8.6 mg/dL (ref 8.6–10.3)
Chloride: 106 mmol/L (ref 98–110)
Creat: 1.57 mg/dL — ABNORMAL HIGH (ref 0.70–1.22)
Globulin: 3 g/dL (calc) (ref 1.9–3.7)
Glucose, Bld: 117 mg/dL — ABNORMAL HIGH (ref 65–99)
Potassium: 4.2 mmol/L (ref 3.5–5.3)
Sodium: 142 mmol/L (ref 135–146)
Total Bilirubin: 0.5 mg/dL (ref 0.2–1.2)
Total Protein: 6.2 g/dL (ref 6.1–8.1)
eGFR: 42 mL/min/{1.73_m2} — ABNORMAL LOW (ref >=60)

## 2022-10-02 LAB — C-REACTIVE PROTEIN: CRP: 14.1 mg/L — ABNORMAL HIGH (ref <8.0)

## 2022-10-16 ENCOUNTER — Ambulatory Visit: Payer: Medicare HMO | Admitting: Podiatry

## 2022-10-16 DIAGNOSIS — R2241 Localized swelling, mass and lump, right lower limb: Secondary | ICD-10-CM | POA: Diagnosis not present

## 2022-10-16 DIAGNOSIS — L97512 Non-pressure chronic ulcer of other part of right foot with fat layer exposed: Secondary | ICD-10-CM

## 2022-10-16 MED ORDER — CEPHALEXIN 500 MG PO CAPS: 500.0000 mg | ORAL_CAPSULE | Freq: Three times a day (TID) | ORAL | 0 refills | Status: AC

## 2022-10-16 NOTE — Patient Instructions (Addendum)
Please call [phone] to schedule an ultrasound on the right calf

## 2022-10-17 NOTE — Progress Notes (Signed)
  Subjective:  Patient ID: Hector Vazquez, male    DOB: 10/22/1929,  MRN: [id number]  Chief Complaint  Patient presents with   Foot Ulcer    Wound care    DOS: 08/16/2022 Procedure: Debridement of right heel ulcer and excision of sesamoid  89 y.o. male returns for post-op check.  They are doing okay, he says that he has had quite a bit more swelling in the right leg  Review of Systems: Negative except as noted in the HPI. Denies N/V/F/Ch.   Objective:   There were no vitals filed for this visit.  There is no height or weight on file to calculate BMI. Constitutional Well developed. Well nourished.  Vascular Foot warm and well perfused. Capillary refill normal to all digits.  Calf is soft and supple, he does have some tenderness in the posterior calf along the veins, severe edema of the right leg compared to the left leg  Neurologic Normal speech. Oriented to person, place, and time. Epicritic sensation to light touch grossly reduced bilaterally.  Dermatologic Plantar sesamoid incision remains fully healed.  Minimal hypertrophy.  Ulceration has improved quite a bit.  Measures 0.3 x 0.3 x 0.2 cm  Orthopedic: Tenderness to palpation noted about the surgical site.    Assessment:   1. Ulcer of right foot with fat layer exposed ([place])   2. Localized swelling of right lower leg    Plan:  Patient was evaluated and treated and all questions answered.  S/p foot surgery right -Overall doing much better.  Large decrease in wound dimensions today.  Full-thickness excisional debridement of the wound bed, biofilm, slough, hyperkeratosis surrounding this was completed to the subcutaneous layer sharply using sharp scalpel.  He tolerated this well.  Hemostasis achieved with manual pressure.  Iodosorb and a padded bandage applied, they may transition to individual bandages as opposed to a full gauze wrap.  Regarding the significant edema on the right side, he does have some tenderness in the  posterior calf, I recommended evaluation of this with a DVT ultrasound and referral was placed for this.  Also placed him on cephalexin ointment cellulitis is causing the edema.  I will see him back in 3 weeks for follow-up.  Return in about 3 weeks (around 11/06/2022) for wound care.

## 2022-10-20 ENCOUNTER — Ambulatory Visit (HOSPITAL_BASED_OUTPATIENT_CLINIC_OR_DEPARTMENT_OTHER)
Admission: RE | Admit: 2022-10-20 | Discharge: 2022-10-20 | Disposition: A | Discharge status: Home / Self Care | Payer: Medicare HMO | Source: Ambulatory Visit | Attending: Podiatry | Admitting: Podiatry

## 2022-10-20 ENCOUNTER — Encounter (HOSPITAL_BASED_OUTPATIENT_CLINIC_OR_DEPARTMENT_OTHER): Payer: Self-pay | Admitting: Emergency Medicine

## 2022-10-20 ENCOUNTER — Other Ambulatory Visit: Payer: Self-pay

## 2022-10-20 ENCOUNTER — Emergency Department (HOSPITAL_BASED_OUTPATIENT_CLINIC_OR_DEPARTMENT_OTHER)
Admission: EM | Admit: 2022-10-20 | Discharge: 2022-10-20 | Disposition: A | Discharge status: Home / Self Care | Payer: Medicare HMO | Attending: Emergency Medicine | Admitting: Emergency Medicine

## 2022-10-20 DIAGNOSIS — I480 Paroxysmal atrial fibrillation: Secondary | ICD-10-CM | POA: Diagnosis not present

## 2022-10-20 DIAGNOSIS — N189 Chronic kidney disease, unspecified: Secondary | ICD-10-CM | POA: Diagnosis not present

## 2022-10-20 DIAGNOSIS — R2241 Localized swelling, mass and lump, right lower limb: Secondary | ICD-10-CM

## 2022-10-20 DIAGNOSIS — I824Z1 Acute embolism and thrombosis of unspecified deep veins of right distal lower extremity: Secondary | ICD-10-CM

## 2022-10-20 DIAGNOSIS — Z7901 Long term (current) use of anticoagulants: Secondary | ICD-10-CM | POA: Insufficient documentation

## 2022-10-20 DIAGNOSIS — I509 Heart failure, unspecified: Secondary | ICD-10-CM | POA: Insufficient documentation

## 2022-10-20 DIAGNOSIS — M7989 Other specified soft tissue disorders: Secondary | ICD-10-CM

## 2022-10-20 NOTE — ED Notes (Signed)
ED Provider at bedside. 

## 2022-10-20 NOTE — ED Provider Notes (Signed)
[facility] Provider Note   CSN: [id number] Arrival date & time: 10/20/22  1446     History  Chief Complaint  Patient presents with   Leg Swelling    Hector Vazquez is a 89 y.o. male.  The history is provided by the patient and medical records. No language interpreter was used.  Leg Pain Lower extremity pain location: no pain. Time since incident:  1 month Injury: no   Tetanus status:  Unknown Relieved by:  Nothing Worsened by:  Nothing Ineffective treatments:  None tried Associated symptoms: swelling   Associated symptoms: no back pain, no fatigue, no fever and no neck pain        Home Medications Prior to Admission medications   Medication Sig Start Date End Date Taking? Authorizing Provider  acetaminophen (TYLENOL) 325 MG tablet Take by mouth. 01/26/22   [provider]  apixaban (ELIQUIS) 5 MG TABS tablet Take by mouth. 09/21/22   [provider]  b complex vitamins capsule Take 1 capsule by mouth daily. 09/03/18   [provider]  CALMOSEPTINE 0.44-20.6 % OINT Apply topically. 09/21/22   [provider]  carvedilol (COREG) 3.125 MG tablet Take by mouth.    [provider]  cephALEXin (KEFLEX) 500 MG capsule Take 1 capsule (500 mg total) by mouth 3 (three) times daily for 14 days. 10/16/22 10/30/22  [name], DPM  Cholecalciferol (VITAMIN D3) 25 MCG (1000 UT) CAPS Take 1,000 Units by mouth daily.    [provider]  lovastatin (MEVACOR) 20 MG tablet Take 20 mg by mouth at bedtime. 12/12/20   [provider]  magnesium oxide (MAG-OX) 400 (240 Mg) MG tablet Take 400 mg by mouth daily.    [provider]  polyethylene glycol (MIRALAX / GLYCOLAX) 17 g packet Take 17 g by mouth daily as needed for moderate constipation. 08/23/22   Mariel Aloe, MD  torsemide (DEMADEX) 20 MG tablet Take 20 mg by mouth daily.    [provider]  traMADol (ULTRAM) 50 MG  tablet Take 1 tablet (50 mg total) by mouth every 8 (eight) hours as needed for moderate pain or severe pain. 08/20/22   Mendel Corning, MD      Allergies    Patient has no known allergies.    Review of Systems   Review of Systems  Constitutional:  Negative for chills, diaphoresis, fatigue and fever.  HENT:  Negative for congestion.   Respiratory:  Negative for cough, chest tightness, shortness of breath and wheezing.   Cardiovascular:  Positive for leg swelling. Negative for chest pain and palpitations.  Gastrointestinal:  Negative for constipation, diarrhea, nausea and vomiting.  Genitourinary:  Negative for flank pain.  Musculoskeletal:  Negative for back pain and neck pain.  Skin:  Negative for rash.  Neurological:  Negative for headaches.  Psychiatric/Behavioral:  Negative for agitation.   All other systems reviewed and are negative.   Physical Exam Updated Vital Signs BP 103/70 (BP Location: Left Arm)   Pulse (!) 106   Temp 98 F (36.7 C) (Oral)   Resp 16   Ht 6\' 3"  (1.905 m)   Wt 78.9 kg   SpO2 100%   BMI 21.75 kg/m  Physical Exam Vitals and nursing note reviewed.  Constitutional:      General: He is not in acute distress.    Appearance: He is well-developed. He is not ill-appearing, toxic-appearing or diaphoretic.  HENT:  Head: Normocephalic and atraumatic.     Nose: Nose normal.     Mouth/Throat:     Mouth: Mucous membranes are moist.  Eyes:     Conjunctiva/sclera: Conjunctivae normal.  Cardiovascular:     Rate and Rhythm: Normal rate and regular rhythm.     Heart sounds: No murmur heard. Pulmonary:     Effort: Pulmonary effort is normal. No respiratory distress.     Breath sounds: Normal breath sounds. No wheezing, rhonchi or rales.  Chest:     Chest wall: No tenderness.  Abdominal:     General: Abdomen is flat.     Palpations: Abdomen is soft.     Tenderness: There is no abdominal tenderness. There is no guarding or rebound.  Musculoskeletal:         General: Swelling present. No tenderness or signs of injury.     Cervical back: Neck supple.     Right lower leg: Edema present.     Left lower leg: Edema present.  Skin:    General: Skin is warm and dry.     Capillary Refill: Capillary refill takes less than 2 seconds.     Findings: No erythema or rash.  Neurological:     General: No focal deficit present.     Mental Status: He is alert.     Sensory: No sensory deficit.     Motor: No weakness.  Psychiatric:        Mood and Affect: Mood normal.     ED Results / Procedures / Treatments   Labs (all labs ordered are listed, but only abnormal results are displayed) Labs Reviewed - No data to display  EKG None  Radiology US Venous Img Lower Unilateral Right (DVT)  Result Date: 10/20/2022 CLINICAL DATA:  Swelling of the right calf EXAM: Right LOWER EXTREMITY VENOUS DOPPLER ULTRASOUND TECHNIQUE: Gray-scale sonography with compression, as well as color and duplex ultrasound, were performed to evaluate the deep venous system(s) from the level of the common femoral vein through the popliteal and proximal calf veins. COMPARISON:  None Available. FINDINGS: VENOUS Venous thrombosis seen throughout the right lower extremity including the common femoral vein, saphenofemoral junction, femoral vein, popliteal vein and calf veins. Left common femoral vein is negative. OTHER None. Limitations: none IMPRESSION: Extensive deep venous thrombosis throughout the right lower extremity. Electronically Signed   By: Nelson Chimes M.D.   On: 10/20/2022 14:48    Procedures Procedures    Medications Ordered in ED Medications - No data to display  ED Course/ Medical Decision Making/ A&P                             Medical Decision Making   Hector Vazquez is a 89 y.o. male with a past medical history significant for Crohn's, CKD, heart failure, paroxysmal atrial fibrillation, osteomyelitis of the right heel currently on antibiotics, and recent admission  for bilateral DVT and PE who presents after outpatient leg ultrasound today.  According to patient, he was admitted last month at [facility] for bilateral blood clots in his legs and lungs and was sent home on Eliquis.  He reports he has been taking it.  He says that his left leg is no longer swollen and the right leg swells up and down.  He denies any new pain in the foot or leg and reports he is overall feeling much better.  He denies any chest pain or shortness of breath and  denies any fevers or chills.  Ports chronic cough that is no different.  Denies any palpitations, abdominal pain, back pain, or flank pains.  He saw his podiatrist who ordered an ultrasound due to the right leg swelling to rule out DVT.   Patient had a positive ultrasound and checked into the ED for evaluation.  Overall, he denies any complaints today.  He reports the right leg is improved and swelling compared to prior and he denies other problems.  On exam, lungs clear and chest nontender.  Abdomen nontender.  Back nontender.  Patient does have palpable DP pulse bilaterally.  He has intact sensation and strength of the foot.  He has a bandaged wound on his heel that is nontender.  Did not have tenderness in the calf.  No ascending erythema or rashes seen.  Patient otherwise resting comfortably.  Heart rate is just over 100 but he denies any chest pain, shortness breath or palpitations.  We reviewed the ultrasound does show he has extensive right leg DVT but left side is negative.  I reviewed the patient's chart and notes from podiatry recently and they do not appear to have documented that they knew he had recent admission for the bilateral PE and DVT.  The discharge summary from his admission showed extensive bilateral lower extremity DVT which they elected to treat medically with Eliquis and he did not get intravascular intervention.  Due to the right leg DVT extent, I called vascular surgery and spoke to Dr. Trula Slade who reviewed  the case.  He suspects that the clot still in the right leg is the same clot from his recent admission last month given the improving but persistent symptoms and lack of complaints today.  He does not feel that the patient is a candidate for acute intervention today as they made a nonintervention decision with the vascular surgery and admission team during his previous admission.  Vascular did recommend using compression stockings which we informed the patient of as well as continue his Eliquis and follow-up with his outpatient team with [facility] and PCP.  We had a shared decision-making conversation going over all these options with the patient including doing more extensive workup with imaging and labs and discussion about admission however they would prefer to continue the outpatient regimen and go home.  They understand return precautions and follow instructions and patient was discharged in good condition.        Final Clinical Impression(s) / ED Diagnoses Final diagnoses:  Leg swelling  Deep vein thrombosis (DVT) of distal vein of right lower extremity, unspecified chronicity ([place])    Rx / DC Orders ED Discharge Orders     None       Clinical Impression: 1. Leg swelling   2. Deep vein thrombosis (DVT) of distal vein of right lower extremity, unspecified chronicity ([facility])     Disposition: Discharge  Condition: Good  I have discussed the results, Dx and Tx plan with the pt(& family if present). He/she/they expressed understanding and agree(s) with the plan. Discharge instructions discussed at great length. Strict return precautions discussed and pt &/or family have verbalized understanding of the instructions. No further questions at time of discharge.    Discharge Medication List as of 10/20/2022  3:33 PM      Follow Up: your PCP and your [facility] vasular team     Garwin Brothers, MD [address] [place] [address] [phone]        Talton Delpriore, Gwenyth Allegra,  MD 10/20/22 1549

## 2022-10-20 NOTE — ED Triage Notes (Addendum)
Patient had ultrasound done today and was sent here for further eval. Pt presents with postop shoe to right foot, toes discolored but patient reports that is not new. Pt had leg swelling for 2 months.

## 2022-10-20 NOTE — Discharge Instructions (Signed)
Your history, exam, evaluation are consistent with residual blood clot in your right leg likely causing the waxing and waning swelling you have been experiencing for several weeks.  After our discussion about your recent admission last month for blood clots in both lungs and both legs, I suspect that your right leg blood clot has been there for some time.  As you report that the swelling has been improving and you have no pain in your leg, foot, chest, and had no shortness of breath, I spoke to the vascular surgeon on-call here and they feel you are appropriate for discharge home with compression stockings and continuation of your Eliquis blood thinner use.  They recommend following up with your primary doctor as well as the [facility] vascular team that helped guide your management plan during your recent admission.  We had a shared decision-making conversation together and agreed to hold on more extensive lab and imaging workup today given your lack of acute symptoms.  I suspect that the podiatry team taking care of you who ordered the ultrasound today may not have known about the recent blood clots.  Please inform them of your recent admission and management as they continue to take care of the foot infection.  If any symptoms change or worsen acutely, please return to the nearest emergency department.

## 2022-10-29 ENCOUNTER — Ambulatory Visit: Payer: Medicare HMO | Admitting: Internal Medicine

## 2022-11-07 ENCOUNTER — Ambulatory Visit (INDEPENDENT_AMBULATORY_CARE_PROVIDER_SITE_OTHER): Payer: Medicare HMO | Admitting: Podiatry

## 2022-11-07 DIAGNOSIS — L97511 Non-pressure chronic ulcer of other part of right foot limited to breakdown of skin: Secondary | ICD-10-CM | POA: Diagnosis not present

## 2022-11-09 NOTE — Progress Notes (Signed)
  Subjective:  Patient ID: Hector Vazquez, male    DOB: 11-08-1929,  MRN: [id number]  Chief Complaint  Patient presents with   Foot Ulcer    3 week follow up right foot    DOS: 08/16/2022 Procedure: Debridement of right heel ulcer and excision of sesamoid  89 y.o. male returns for post-op check.  He says it is doing well  Review of Systems: Negative except as noted in the HPI. Denies N/V/F/Ch.   Objective:   There were no vitals filed for this visit.  There is no height or weight on file to calculate BMI. Constitutional Well developed. Well nourished.  Vascular Foot warm and well perfused. Capillary refill normal to all digits.  Calf is soft and supple, he does have some tenderness in the posterior calf along the veins, severe edema of the right leg compared to the left leg  Neurologic Normal speech. Oriented to person, place, and time. Epicritic sensation to light touch grossly reduced bilaterally.  Dermatologic Ulceration has fully healed, incision is healed well plantar first metatarsal phalangeal joint.  Minimal fat pad remaining of the plantar heel  Orthopedic: He has no pain to palpation noted about the surgical site.    Assessment:   1. Ulcer of right foot, limited to breakdown of skin    Plan:  Patient was evaluated and treated and all questions answered.  S/p foot surgery right -Doing very well and has fully healed.  He may return to regular shoe gear.  I recommended cushioning the plantar heel as he has had minimal fat pad remaining of the plantar calcaneus.  Silicone pad was placed into his shoes.  He may be weightbearing as tolerated.  I will see him back in 1 month for a final visit to ensure the wound remains healed  Return in about 1 month (around 12/07/2022) for wound care.

## 2022-12-10 ENCOUNTER — Ambulatory Visit: Payer: Medicare HMO | Admitting: Podiatry

## 2022-12-10 DIAGNOSIS — L97511 Non-pressure chronic ulcer of other part of right foot limited to breakdown of skin: Secondary | ICD-10-CM

## 2022-12-10 DIAGNOSIS — B351 Tinea unguium: Secondary | ICD-10-CM

## 2022-12-10 DIAGNOSIS — M79675 Pain in left toe(s): Secondary | ICD-10-CM

## 2022-12-10 DIAGNOSIS — M79674 Pain in right toe(s): Secondary | ICD-10-CM

## 2022-12-11 NOTE — Progress Notes (Signed)
  Subjective:  Patient ID: Hector Vazquez, male    DOB: Apr 30, 1930,  MRN: [id number]  Chief Complaint  Patient presents with   Foot Ulcer    4 week follow up right foot ulcer    DOS: 08/16/2022 Procedure: Debridement of right heel ulcer and excision of sesamoid  89 y.o. male returns for post-op check.  He says it is doing well, the wound remains healed.  His nails are thick and elongated they are unable to take care of them.  Review of Systems: Negative except as noted in the HPI. Denies N/V/F/Ch.   Objective:   There were no vitals filed for this visit.  There is no height or weight on file to calculate BMI. Constitutional Well developed. Well nourished.  Vascular Foot warm and well perfused. Capillary refill normal to all digits.  Calf is soft and supple, he does have some tenderness in the posterior calf along the veins, severe edema of the right leg compared to the left leg  Neurologic Normal speech. Oriented to person, place, and time. Epicritic sensation to light touch grossly reduced bilaterally.  Dermatologic Ulceration has fully healed, incision is healed well plantar first metatarsal phalangeal joint.  Minimal fat pad remaining of the plantar heel.  Nails thickened elongated yellow dystrophic and has subungual debris  Orthopedic: He has no pain to palpation noted about the surgical site.    Assessment:   1. Ulcer of right foot, limited to breakdown of skin ([facility])   2. Pain due to onychomycosis of toenails of both feet    Plan:  Patient was evaluated and treated and all questions answered.  S/p foot surgery right -Wound remains healed.  He may continue to wear regular shoe gear.  We discussed areas of pressure that he should watch out for, advised to wear good comfortable shoes with stretching type material which he now has.  Return to see me as needed if wound returns.  Discussed the etiology and treatment options for the condition in detail with the patient. Educated  patient on the topical and oral treatment options for mycotic nails. Recommended debridement of the nails today. Sharp and mechanical debridement performed of all painful and mycotic nails today. Nails debrided in length and thickness using a nail nipper to level of comfort. Discussed treatment options including appropriate shoe gear. Follow up as needed for painful nails.   Return in 3 months (on 03/12/2023) for painful thick fungal nails.

## 2023-03-11 ENCOUNTER — Ambulatory Visit: Payer: Medicare HMO | Admitting: Podiatry

## 2023-03-11 ENCOUNTER — Encounter: Payer: Self-pay | Admitting: Podiatry

## 2023-03-11 DIAGNOSIS — B351 Tinea unguium: Secondary | ICD-10-CM | POA: Diagnosis not present

## 2023-03-11 DIAGNOSIS — L97511 Non-pressure chronic ulcer of other part of right foot limited to breakdown of skin: Secondary | ICD-10-CM

## 2023-03-11 DIAGNOSIS — M79674 Pain in right toe(s): Secondary | ICD-10-CM | POA: Diagnosis not present

## 2023-03-11 DIAGNOSIS — M79675 Pain in left toe(s): Secondary | ICD-10-CM

## 2023-03-11 NOTE — Progress Notes (Signed)
  Subjective:  Patient ID: Hector Vazquez, male    DOB: Aug 05, 1929,   MRN: [id number]  Chief Complaint  Patient presents with   Wound Check    RIGHT   Callouses    RIGHT HEEL     89 y.o. male presents for concern of thickened elongated and painful nails that are difficult to trim. Requesting to have them trimmed today. Here for callus trim as well. Here with daughter who relates concern that the wound is opening up on the great toe area on the right.   PCP:  [name], MD    . Denies any other pedal complaints. Denies n/v/f/c.   Past Medical History:  Diagnosis Date   Coronary artery disease     Objective:  Physical Exam: Vascular: DP/PT pulses 2/4 bilateral. CFT <3 seconds. Absent hair growth on digits. Edema noted to bilateral lower extremities. Xerosis noted bilaterally.  Skin. No lacerations or abrasions bilateral feet. Nails 1-5 bilateral  are thickened discolored and elongated with subungual debris. Hyperkeratotic lesion noted to platnar right heel and plantar first metatarsal. Upon debridement small linear ulceration noted to platnar first metatarsal with granular base. No erythema edema or purulence noted.  Musculoskeletal: MMT 5/5 bilateral lower extremities in DF, PF, Inversion and Eversion. Deceased ROM in DF of ankle joint.  Neurological: Sensation intact to light touch. Protective sensation diminished bilateral.    Assessment:  No diagnosis found.   Plan:  Patient was evaluated and treated and all questions answered. Ulcer right plantar first metatarsal with limit to breakdown of skin -Debridement as below. -Dressed with neosporin, DSD. -Off-loading with surgical shoe. -No abx indicated.  -Discussed glucose control and proper protein-rich diet.  -Discussed if any worsening redness, pain, fever or chills to call or may need to report to the emergency room. Patient expressed understanding.   -Discussed supportive shoes at all times and checking feet regularly.   -Mechanically debrided all nails 1-5 bilateral using sterile nail nipper and filed with dremel without incident  -Answered all patient questions  Procedure: Excisional Debridement of Wound Rationale: Removal of non-viable soft tissue from the wound to promote healing.  Anesthesia: none Pre-Debridement Wound Measurements: Overlying callus  Post-Debridement Wound Measurements: 0.1 cm x 0.1 cm x 0.1 cm  Type of Debridement: Sharp Excisional Tissue Removed: Non-viable soft tissue Depth of Debridement: subcutaneous tissue. Technique: Sharp excisional debridement to bleeding, viable wound base.  Dressing: Dry, sterile, compression dressing. Disposition: Patient tolerated procedure well. Patient to return in 2 week for follow-up.  Return in about 4 weeks (around 04/08/2023) for wound check.   Louann Sjogren, DPM

## 2023-04-29 ENCOUNTER — Ambulatory Visit: Payer: Medicare HMO | Admitting: Podiatry

## 2023-04-30 ENCOUNTER — Encounter: Payer: Self-pay | Admitting: Podiatry

## 2023-04-30 ENCOUNTER — Ambulatory Visit (INDEPENDENT_AMBULATORY_CARE_PROVIDER_SITE_OTHER): Payer: Medicare HMO | Admitting: Podiatry

## 2023-04-30 VITALS — BP 131/62 | HR 86 | Temp 96.1°F | Ht 75.0 in | Wt 174.0 lb

## 2023-04-30 DIAGNOSIS — L97511 Non-pressure chronic ulcer of other part of right foot limited to breakdown of skin: Secondary | ICD-10-CM | POA: Diagnosis not present

## 2023-04-30 MED ORDER — MUPIROCIN 2 % EX OINT: 1.0000 | TOPICAL_OINTMENT | Freq: Every day | CUTANEOUS | 2 refills | Status: AC

## 2023-04-30 NOTE — Progress Notes (Signed)
Subjective:  Patient ID: Hector Vazquez. Thumma, male    DOB: September 19, 1929,  MRN: [id number]  Chief Complaint  Patient presents with   Foot Pain    r painful thick fungal nails/painful callus R foot/ pts nursing facility is to call ro t/s    89 y.o. male presents with the above complaint. History confirmed with patient.  He returns for follow-up there was concerned that the wound is returning  Objective:  Physical Exam: warm, good capillary refill, normal sensory exam, and +1 DP and PT pulse on the right foot there is partial-thickness skin breakdown of the plantar heel and full-thickness skin breakdown at the plantar first MTP with surrounding hyperkeratosis no signs of infection or drainage there is a granular wound bed.       Assessment:   1. Ulcer of right foot, limited to breakdown of skin [facility])      Plan:  Patient was evaluated and treated and all questions answered.   Has had some recurrence of ulceration and debris of the lesions of hyperkeratosis and surrounding tissue in the wound bed to a granular wound bed.  This was dressed with Iodosorb and a Band-Aid.  They should dress daily and Rx for mupirocin ointment was sent to his pharmacy and care instructions were written on his paperwork from the nursing facility.  I will see him back in 4 weeks to reevaluate.  He again inquired about being able to drive, from a foot and ankle standpoint I do not have any restrictions for him but I discussed that he should discuss this with his PCP if he has any other reasons not to be driving.  A dancers pad was placed into his right shoe to offload the first MTP  Return in about 4 weeks (around 05/28/2023) for wound care.

## 2023-04-30 NOTE — Patient Instructions (Signed)
Change dressing daily with a band aid and mupirocin ointment (Rx sent to [facility])

## 2023-05-22 IMAGING — DX DG KNEE COMPLETE 4+V*R*
4 series · 4 of 4 positions shown · non-contrast
Comparison: None Available.

CLINICAL DATA: Knee pain after fall.

EXAM:
RIGHT KNEE - COMPLETE 4+ VIEW

[knee ap]
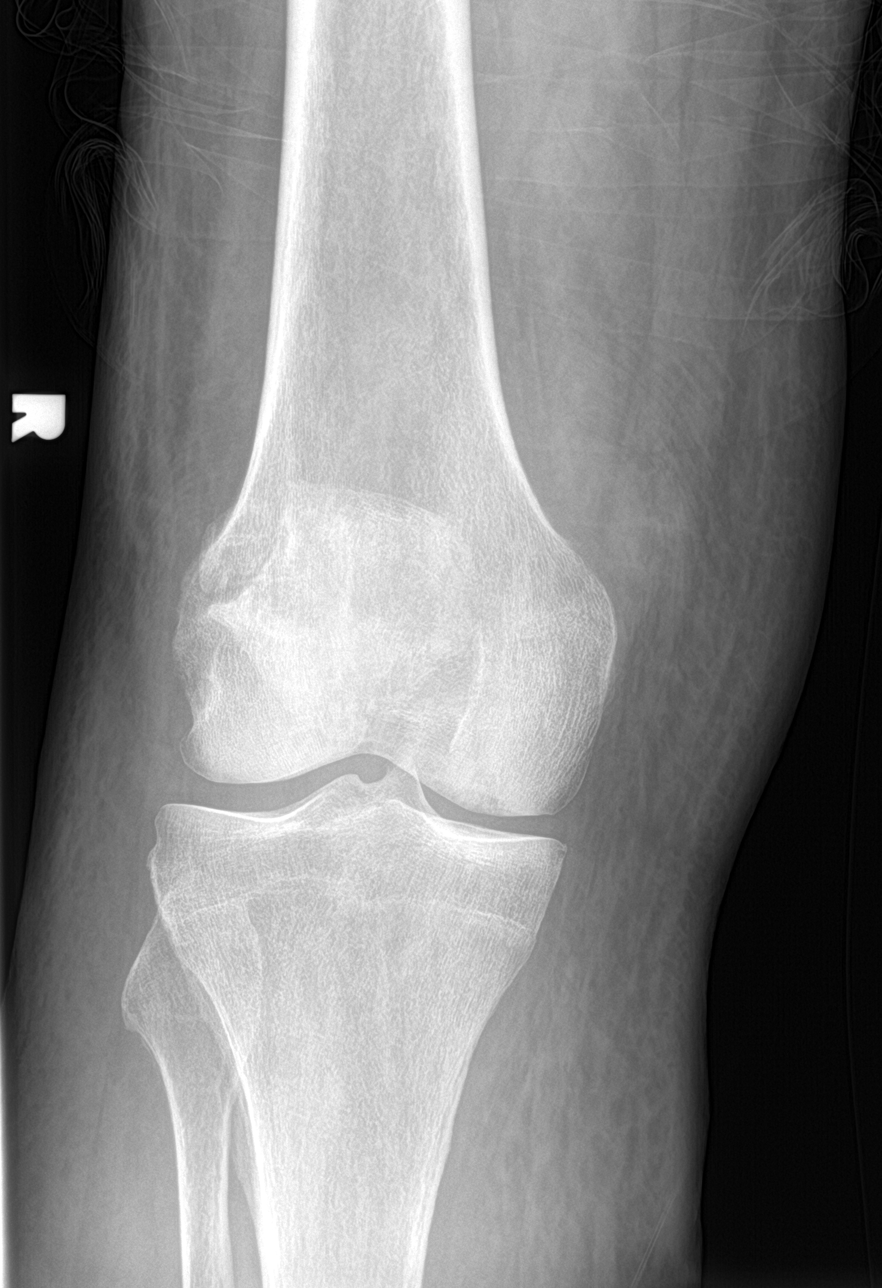

[knee lat]
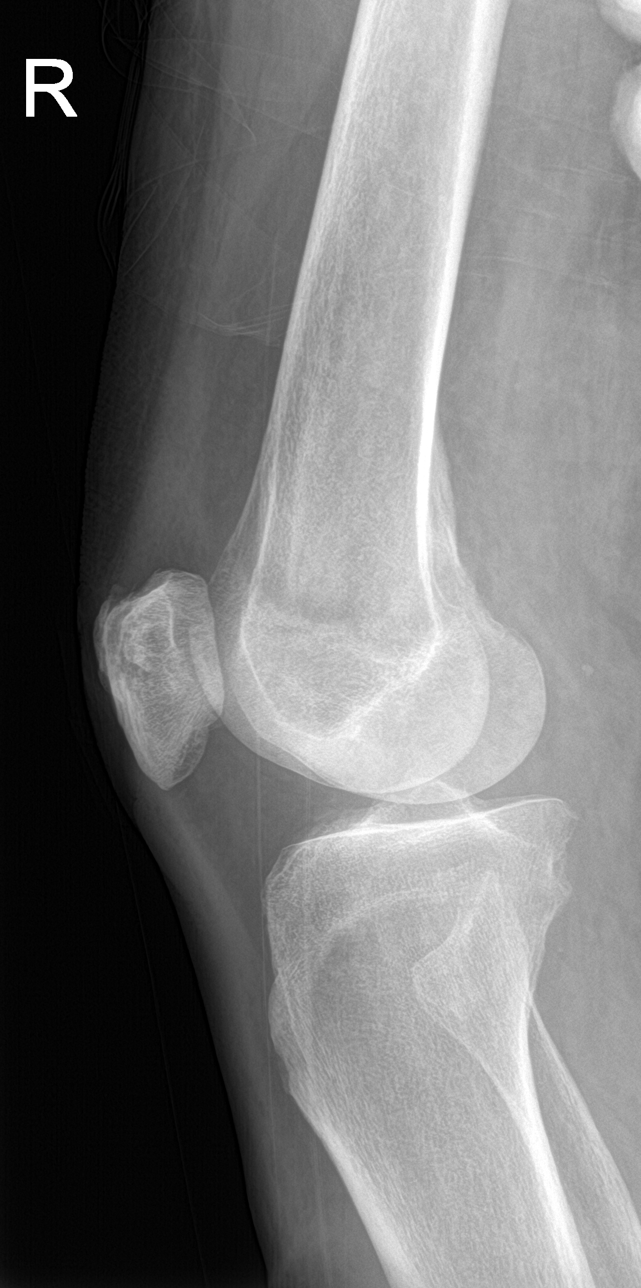

[knee obl (1 of 2)]
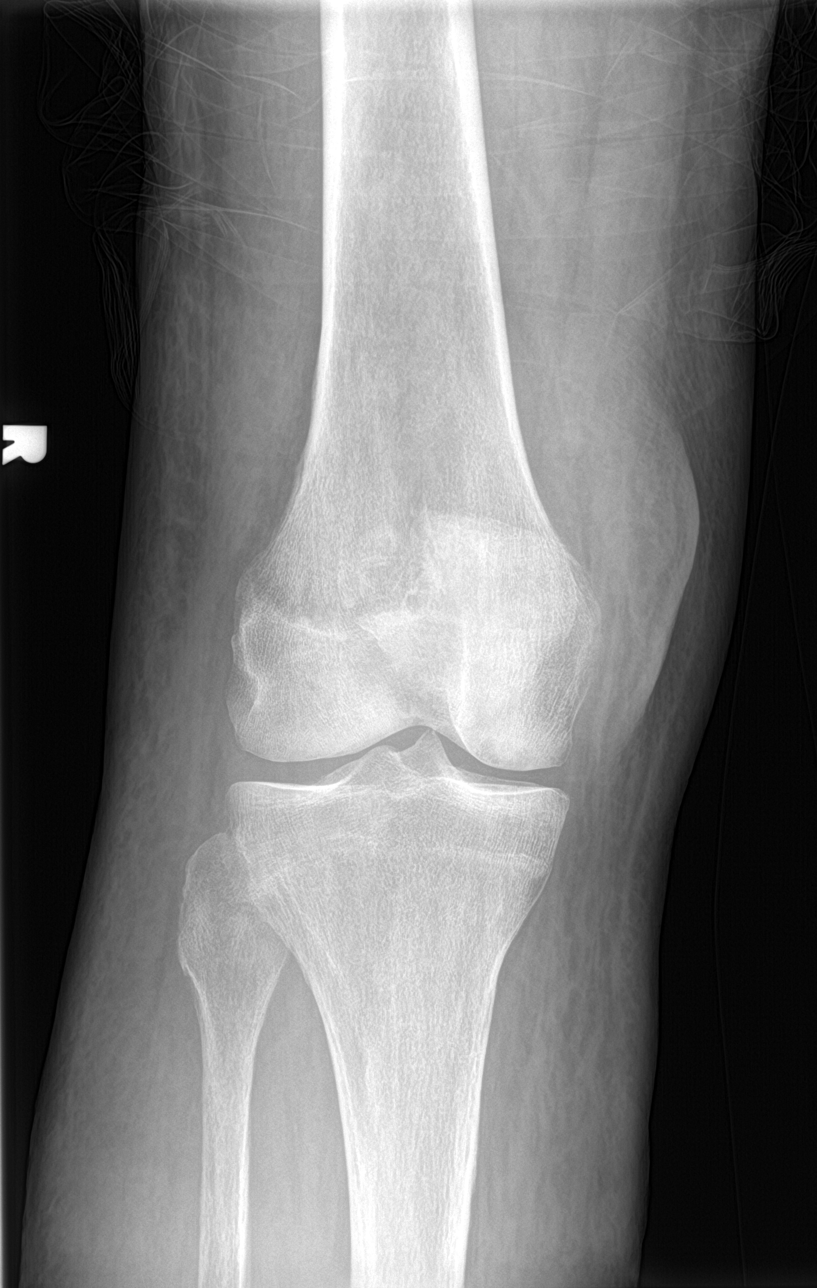

[knee obl (2 of 2)]
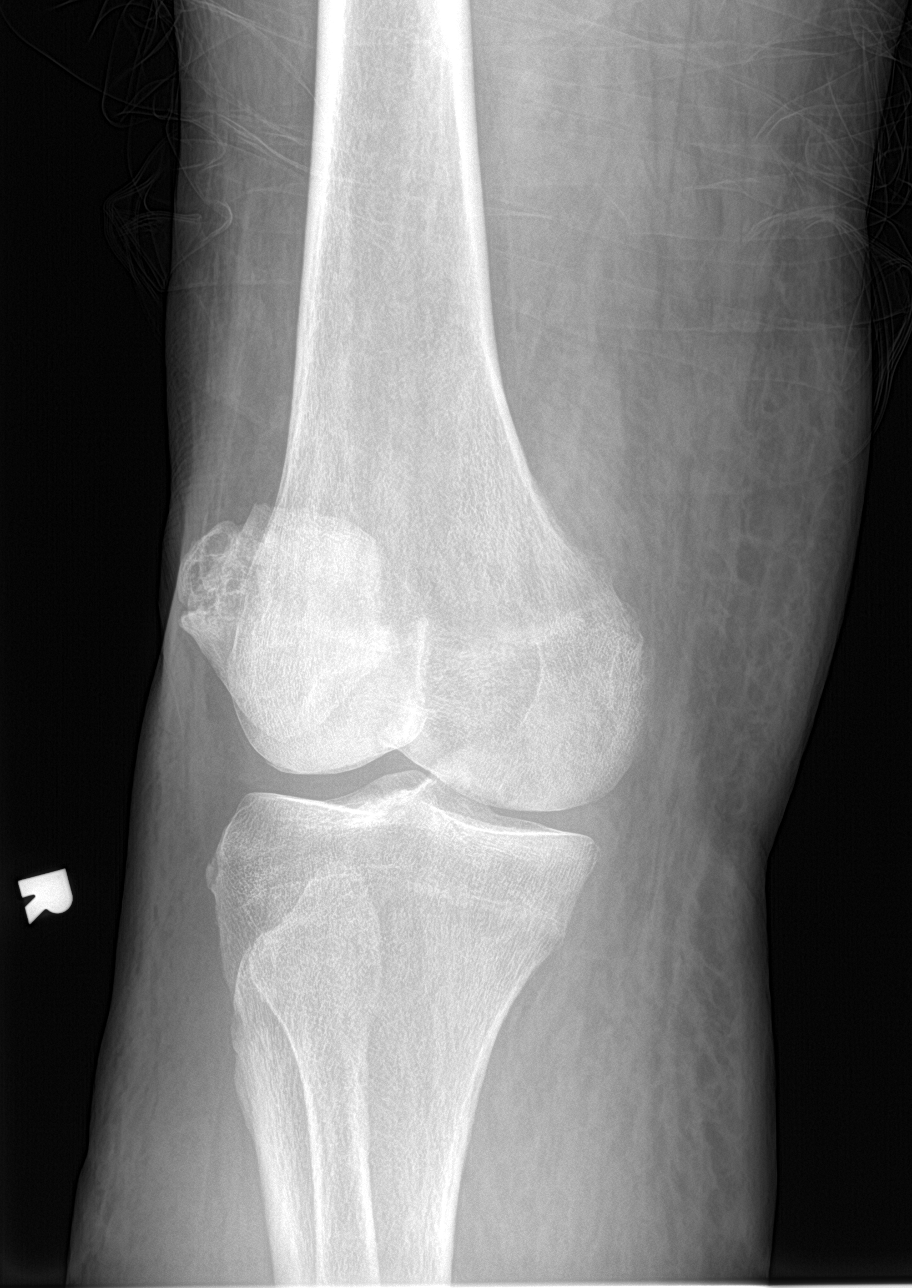

[4 of 4 positions shown; findings below may reference images not displayed]

FINDINGS: Enthesopathic changes associated with the patella. There appears to
be a bipartite patella. No acute fracture identified. No joint
effusion. No significant degenerative changes. No other acute
abnormalities.
IMPRESSION: No acute abnormalities are identified.

## 2023-05-28 ENCOUNTER — Ambulatory Visit (INDEPENDENT_AMBULATORY_CARE_PROVIDER_SITE_OTHER): Payer: Medicare HMO | Admitting: Podiatry

## 2023-05-28 DIAGNOSIS — L97511 Non-pressure chronic ulcer of other part of right foot limited to breakdown of skin: Secondary | ICD-10-CM

## 2023-05-28 NOTE — Progress Notes (Signed)
  Subjective:  Patient ID: Marty Heck. Cuthrell, male    DOB: 03-Aug-1929,  MRN: [id number]  Chief Complaint  Patient presents with   Ulcer    2x ulcer right foot. No drainage noted. Bandaids removed.    89 y.o. male presents with the above complaint. History confirmed with patient.  He returns for follow-up and ongoing wound care  Objective:  Physical Exam: warm, good capillary refill, normal sensory exam, and +1 DP and PT pulse on the right foot there is partial-thickness skin breakdown plantar first MTP with surrounding hyperkeratosis no signs of infection or drainage there is a granular wound bed.  It measures 0.6 x 0.4 x 0.1 cm.  The heel ulcer has resolved       Assessment:   1. Ulcer of right foot, limited to breakdown of skin [facility])       Plan:  Patient was evaluated and treated and all questions answered.   Greatly improved with offloading and use of mupirocin ointment.  They continue to change this daily.  I will see him back in 3 weeks for ongoing wound care.  The heel ulceration may be left open to air.  Weightbearing as tolerated  Return in about 3 weeks (around 06/18/2023) for wound care.

## 2023-07-04 ENCOUNTER — Ambulatory Visit (INDEPENDENT_AMBULATORY_CARE_PROVIDER_SITE_OTHER): Payer: Medicare HMO | Admitting: Podiatry

## 2023-07-04 DIAGNOSIS — M21611 Bunion of right foot: Secondary | ICD-10-CM

## 2023-07-04 DIAGNOSIS — M2011 Hallux valgus (acquired), right foot: Secondary | ICD-10-CM

## 2023-07-04 DIAGNOSIS — M79671 Pain in right foot: Secondary | ICD-10-CM | POA: Diagnosis not present

## 2023-07-04 DIAGNOSIS — M216X1 Other acquired deformities of right foot: Secondary | ICD-10-CM

## 2023-07-04 DIAGNOSIS — L97511 Non-pressure chronic ulcer of other part of right foot limited to breakdown of skin: Secondary | ICD-10-CM | POA: Diagnosis not present

## 2023-07-04 DIAGNOSIS — G8929 Other chronic pain: Secondary | ICD-10-CM

## 2023-07-04 NOTE — Progress Notes (Signed)
  Subjective:  Patient ID: Hector Vazquez. Mcallister, male    DOB: 1929-11-27,  MRN: [id number]  No chief complaint on file.   89 y.o. male presents with the above complaint. History confirmed with patient.  He returns for follow-up states is doing well has not had any drainage but is having heel pain  Objective:  Physical Exam: warm, good capillary refill, normal sensory exam, and +1 DP and PT pulse on the right foot there is no active ulceration submetatarsal 1 or on the heel he does have tenderness on the plantar heel there is thinning of the metatarsal and plantar heel fat pad he does have hallux valgus deformity    Assessment:   1. Ulcer of right foot, limited to breakdown of skin (HCC)   2. Hallux valgus with bunions, right   3. Chronic heel pain, right   4. 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  Doing much better and may leave open to air with return to regular bathing and apply the Eucerin type lotion daily.  No further wound care is needed we discussed the thinning of the fat pad of the plantar heel from the wound and from aging and I dispensed a silicone offloading heel pad that he may utilize as well as a silicone toe spacer to prevent the hallux valgus from worsening which may occur with loss of the sesamoid over time.  He will return to see me again as needed.  Return if symptoms worsen or fail to improve.

## 2023-12-23 ENCOUNTER — Encounter: Payer: Self-pay | Admitting: Podiatry

## 2023-12-23 ENCOUNTER — Ambulatory Visit: Admitting: Podiatry

## 2023-12-23 DIAGNOSIS — L84 Corns and callosities: Secondary | ICD-10-CM | POA: Diagnosis not present

## 2023-12-23 NOTE — Progress Notes (Signed)
  Subjective:  Patient ID: Hector Vazquez. Hector Vazquez, male    DOB: 06/03/1930,  MRN: [id number]  Chief Complaint  Patient presents with   Callouses    Right foot medial arch callus. 0 pain. Non diabetic.    89 y.o. male presents with the above complaint. History confirmed with patient.  Presents today with a new issue of a large painful callus on the instep, they feel like this is affecting his ability to walk and ambulate  Objective:  Physical Exam: warm, good capillary refill, no trophic changes or ulcerative lesions, normal DP and PT pulses, normal sensory exam, and large callus plantar medial arch.  Assessment:   1. Callus of foot      Plan:  Patient was evaluated and treated and all questions answered.  Callus was debrided as a courtesy today sharply with a scalpel.  I recommended urea 40% cream daily and a pumice stone.  The facility he is at has a visiting podiatrist that would be able to manage this long-term as well as his thickened elongated nails which they will contact them for.  Follow-up with me as needed if it worsens there is no active ulceration currently.  Return if symptoms worsen or fail to improve.

## 2023-12-23 NOTE — Patient Instructions (Signed)
 Look for urea 40% cream or ointment and apply to the thickened dry skin / calluses. This can be bought over the counter, at a pharmacy or online such as [employer].
